# Patient Record
Sex: Female | Born: 1950 | Race: Black or African American | Hispanic: No | Marital: Single | Smoking: Never smoker
Health system: Southern US, Community
[De-identification: ages and names within clinical notes are randomized; demographics above are authoritative.]

## PROBLEM LIST (undated history)

## (undated) DIAGNOSIS — M1990 Unspecified osteoarthritis, unspecified site: Secondary | ICD-10-CM

## (undated) DIAGNOSIS — M549 Dorsalgia, unspecified: Secondary | ICD-10-CM

## (undated) DIAGNOSIS — I10 Essential (primary) hypertension: Secondary | ICD-10-CM

## (undated) DIAGNOSIS — R609 Edema, unspecified: Secondary | ICD-10-CM

## (undated) DIAGNOSIS — F329 Major depressive disorder, single episode, unspecified: Secondary | ICD-10-CM

## (undated) DIAGNOSIS — D649 Anemia, unspecified: Secondary | ICD-10-CM

## (undated) DIAGNOSIS — R569 Unspecified convulsions: Secondary | ICD-10-CM

## (undated) DIAGNOSIS — F32A Depression, unspecified: Secondary | ICD-10-CM

## (undated) DIAGNOSIS — G8929 Other chronic pain: Secondary | ICD-10-CM

## (undated) DIAGNOSIS — I509 Heart failure, unspecified: Secondary | ICD-10-CM

## (undated) DIAGNOSIS — E119 Type 2 diabetes mellitus without complications: Secondary | ICD-10-CM

## (undated) DIAGNOSIS — E785 Hyperlipidemia, unspecified: Secondary | ICD-10-CM

## (undated) DIAGNOSIS — N184 Chronic kidney disease, stage 4 (severe): Secondary | ICD-10-CM

## (undated) DIAGNOSIS — E039 Hypothyroidism, unspecified: Secondary | ICD-10-CM

## (undated) DIAGNOSIS — F419 Anxiety disorder, unspecified: Secondary | ICD-10-CM

## (undated) DIAGNOSIS — M109 Gout, unspecified: Secondary | ICD-10-CM

## (undated) HISTORY — PX: NO PAST SURGERIES: SHX2092

---

## 1998-04-15 DIAGNOSIS — E109 Type 1 diabetes mellitus without complications: Secondary | ICD-10-CM | POA: Insufficient documentation

## 1998-04-15 DIAGNOSIS — I10 Essential (primary) hypertension: Secondary | ICD-10-CM | POA: Insufficient documentation

## 1998-05-28 ENCOUNTER — Other Ambulatory Visit: Admission: RE | Admit: 1998-05-28 | Discharge: 1998-05-28 | Payer: Self-pay | Admitting: Family Medicine

## 1998-05-28 DIAGNOSIS — E039 Hypothyroidism, unspecified: Secondary | ICD-10-CM

## 2003-01-06 DIAGNOSIS — M109 Gout, unspecified: Secondary | ICD-10-CM

## 2004-07-09 ENCOUNTER — Ambulatory Visit: Payer: Self-pay | Admitting: Internal Medicine

## 2004-09-28 ENCOUNTER — Ambulatory Visit: Payer: Self-pay | Admitting: Family Medicine

## 2004-11-27 DIAGNOSIS — N183 Chronic kidney disease, stage 3 (moderate): Secondary | ICD-10-CM

## 2004-12-29 ENCOUNTER — Ambulatory Visit: Payer: Self-pay | Admitting: Family Medicine

## 2005-01-03 ENCOUNTER — Ambulatory Visit: Payer: Self-pay | Admitting: Family Medicine

## 2005-03-29 ENCOUNTER — Ambulatory Visit: Payer: Self-pay | Admitting: Family Medicine

## 2005-05-24 ENCOUNTER — Ambulatory Visit: Payer: Self-pay | Admitting: Family Medicine

## 2005-07-13 ENCOUNTER — Ambulatory Visit: Payer: Self-pay | Admitting: Family Medicine

## 2005-07-13 ENCOUNTER — Other Ambulatory Visit: Admission: RE | Admit: 2005-07-13 | Discharge: 2005-07-13 | Payer: Self-pay | Admitting: Family Medicine

## 2005-09-27 ENCOUNTER — Encounter: Admission: RE | Admit: 2005-09-27 | Discharge: 2005-09-27 | Payer: Self-pay | Admitting: Nephrology

## 2005-12-12 ENCOUNTER — Ambulatory Visit: Payer: Self-pay | Admitting: Family Medicine

## 2006-03-14 ENCOUNTER — Ambulatory Visit: Payer: Self-pay | Admitting: Family Medicine

## 2006-05-15 ENCOUNTER — Ambulatory Visit: Payer: Self-pay | Admitting: Family Medicine

## 2006-09-18 ENCOUNTER — Ambulatory Visit: Payer: Self-pay | Admitting: Family Medicine

## 2006-09-26 ENCOUNTER — Ambulatory Visit: Payer: Self-pay | Admitting: Family Medicine

## 2006-12-05 ENCOUNTER — Ambulatory Visit: Payer: Self-pay | Admitting: Family Medicine

## 2007-01-04 DIAGNOSIS — N2581 Secondary hyperparathyroidism of renal origin: Secondary | ICD-10-CM | POA: Insufficient documentation

## 2007-07-26 ENCOUNTER — Encounter (INDEPENDENT_AMBULATORY_CARE_PROVIDER_SITE_OTHER): Payer: Self-pay | Admitting: Family Medicine

## 2007-08-14 ENCOUNTER — Encounter (INDEPENDENT_AMBULATORY_CARE_PROVIDER_SITE_OTHER): Payer: Self-pay | Admitting: Family Medicine

## 2007-09-10 ENCOUNTER — Telehealth (INDEPENDENT_AMBULATORY_CARE_PROVIDER_SITE_OTHER): Payer: Self-pay | Admitting: Family Medicine

## 2007-10-02 ENCOUNTER — Ambulatory Visit: Payer: Self-pay | Admitting: Family Medicine

## 2007-10-02 DIAGNOSIS — E785 Hyperlipidemia, unspecified: Secondary | ICD-10-CM

## 2007-10-02 DIAGNOSIS — F70 Mild intellectual disabilities: Secondary | ICD-10-CM | POA: Insufficient documentation

## 2007-10-02 DIAGNOSIS — H9190 Unspecified hearing loss, unspecified ear: Secondary | ICD-10-CM | POA: Insufficient documentation

## 2007-10-02 LAB — CONVERTED CEMR LAB
Blood Glucose, Fingerstick: 156
Cholesterol: 130 mg/dL (ref 0–200)
Free T4: 1.22 ng/dL (ref 0.89–1.80)
HDL: 35 mg/dL — ABNORMAL LOW (ref >39)
Hgb A1c MFr Bld: 7.7 %
LDL Cholesterol: 77 mg/dL (ref 0–99)
TSH: 0.895 microintl units/mL (ref 0.350–5.50)
Total CHOL/HDL Ratio: 3.7
Triglycerides: 90 mg/dL (ref <150)
Uric Acid, Serum: 8.2 mg/dL — ABNORMAL HIGH (ref 2.4–7.0)
VLDL: 18 mg/dL (ref 0–40)

## 2007-10-11 ENCOUNTER — Encounter (INDEPENDENT_AMBULATORY_CARE_PROVIDER_SITE_OTHER): Payer: Self-pay | Admitting: Family Medicine

## 2008-07-10 ENCOUNTER — Telehealth (INDEPENDENT_AMBULATORY_CARE_PROVIDER_SITE_OTHER): Payer: Self-pay | Admitting: *Deleted

## 2008-07-18 ENCOUNTER — Telehealth (INDEPENDENT_AMBULATORY_CARE_PROVIDER_SITE_OTHER): Payer: Self-pay | Admitting: *Deleted

## 2008-07-21 ENCOUNTER — Ambulatory Visit: Payer: Self-pay | Admitting: Family Medicine

## 2008-07-21 LAB — CONVERTED CEMR LAB
Blood Glucose, Fingerstick: 188
Hgb A1c MFr Bld: 6.8 %

## 2008-07-28 ENCOUNTER — Telehealth (INDEPENDENT_AMBULATORY_CARE_PROVIDER_SITE_OTHER): Payer: Self-pay | Admitting: Family Medicine

## 2008-07-29 ENCOUNTER — Encounter (INDEPENDENT_AMBULATORY_CARE_PROVIDER_SITE_OTHER): Payer: Self-pay | Admitting: Family Medicine

## 2008-08-25 ENCOUNTER — Encounter (INDEPENDENT_AMBULATORY_CARE_PROVIDER_SITE_OTHER): Payer: Self-pay | Admitting: Family Medicine

## 2008-09-07 ENCOUNTER — Telehealth (INDEPENDENT_AMBULATORY_CARE_PROVIDER_SITE_OTHER): Payer: Self-pay | Admitting: Family Medicine

## 2008-09-07 LAB — CONVERTED CEMR LAB
ALT: 8 units/L (ref 0–35)
AST: 15 units/L (ref 0–37)
Albumin: 4.1 g/dL (ref 3.5–5.2)
Alkaline Phosphatase: 119 units/L — ABNORMAL HIGH (ref 39–117)
BUN: 21 mg/dL (ref 6–23)
Basophils Absolute: 0 10*3/uL (ref 0.0–0.1)
Basophils Relative: 0 % (ref 0–1)
CO2: 22 meq/L (ref 19–32)
Calcium: 8.9 mg/dL (ref 8.4–10.5)
Chloride: 97 meq/L (ref 96–112)
Cholesterol: 189 mg/dL (ref 0–200)
Creatinine, Ser: 1.68 mg/dL — ABNORMAL HIGH (ref 0.40–1.20)
Eosinophils Absolute: 0.1 10*3/uL (ref 0.0–0.7)
Eosinophils Relative: 1 % (ref 0–5)
Glucose, Bld: 116 mg/dL — ABNORMAL HIGH (ref 70–99)
HCT: 44.9 % (ref 36.0–46.0)
HDL: 49 mg/dL (ref >39)
Hemoglobin: 13.4 g/dL (ref 12.0–15.0)
LDL Cholesterol: 116 mg/dL — ABNORMAL HIGH (ref 0–99)
Lymphocytes Relative: 28 % (ref 12–46)
Lymphs Abs: 2.1 10*3/uL (ref 0.7–4.0)
MCHC: 29.8 g/dL — ABNORMAL LOW (ref 30.0–36.0)
MCV: 94.7 fL (ref 78.0–100.0)
Microalb, Ur: 48.8 mg/dL — ABNORMAL HIGH (ref 0.00–1.89)
Monocytes Absolute: 0.7 10*3/uL (ref 0.1–1.0)
Monocytes Relative: 9 % (ref 3–12)
Neutro Abs: 4.7 10*3/uL (ref 1.7–7.7)
Neutrophils Relative %: 62 % (ref 43–77)
Platelets: 284 10*3/uL (ref 150–400)
Potassium: 4.5 meq/L (ref 3.5–5.3)
Pro B Natriuretic peptide (BNP): 67 pg/mL (ref 0.0–100.0)
RBC: 4.74 M/uL (ref 3.87–5.11)
RDW: 17.9 % — ABNORMAL HIGH (ref 11.5–15.5)
Sodium: 140 meq/L (ref 135–145)
TSH: 33.803 microintl units/mL — ABNORMAL HIGH (ref 0.350–4.50)
Total Bilirubin: 0.4 mg/dL (ref 0.3–1.2)
Total CHOL/HDL Ratio: 3.9
Total Protein: 8.3 g/dL (ref 6.0–8.3)
Triglycerides: 118 mg/dL (ref <150)
VLDL: 24 mg/dL (ref 0–40)
WBC: 7.5 10*3/uL (ref 4.0–10.5)

## 2008-09-08 ENCOUNTER — Encounter (INDEPENDENT_AMBULATORY_CARE_PROVIDER_SITE_OTHER): Payer: Self-pay | Admitting: Family Medicine

## 2008-10-03 ENCOUNTER — Encounter (INDEPENDENT_AMBULATORY_CARE_PROVIDER_SITE_OTHER): Payer: Self-pay | Admitting: Family Medicine

## 2009-02-02 ENCOUNTER — Telehealth (INDEPENDENT_AMBULATORY_CARE_PROVIDER_SITE_OTHER): Payer: Self-pay | Admitting: Family Medicine

## 2009-08-19 ENCOUNTER — Encounter (INDEPENDENT_AMBULATORY_CARE_PROVIDER_SITE_OTHER): Payer: Self-pay | Admitting: Nurse Practitioner

## 2009-10-06 ENCOUNTER — Telehealth: Payer: Self-pay | Admitting: Physician Assistant

## 2009-10-15 ENCOUNTER — Encounter (INDEPENDENT_AMBULATORY_CARE_PROVIDER_SITE_OTHER): Payer: Self-pay | Admitting: *Deleted

## 2010-11-21 ENCOUNTER — Encounter: Payer: Self-pay | Admitting: Family Medicine

## 2010-11-21 ENCOUNTER — Encounter: Payer: Self-pay | Admitting: Occupational Therapy

## 2010-11-21 ENCOUNTER — Encounter: Payer: Self-pay | Admitting: Nephrology

## 2011-01-21 ENCOUNTER — Other Ambulatory Visit: Payer: Self-pay | Admitting: Geriatric Medicine

## 2011-01-21 DIAGNOSIS — Z1231 Encounter for screening mammogram for malignant neoplasm of breast: Secondary | ICD-10-CM

## 2011-01-26 ENCOUNTER — Ambulatory Visit: Payer: Self-pay

## 2011-05-05 ENCOUNTER — Emergency Department (HOSPITAL_COMMUNITY): Payer: Medicaid Other

## 2011-05-05 ENCOUNTER — Inpatient Hospital Stay (HOSPITAL_COMMUNITY)
Admission: EM | Admit: 2011-05-05 | Discharge: 2011-05-07 | DRG: 071 | Disposition: A | Discharge status: Home / Self Care | Payer: Medicaid Other | Attending: Internal Medicine | Admitting: Internal Medicine

## 2011-05-05 ENCOUNTER — Encounter (HOSPITAL_COMMUNITY): Payer: Self-pay | Admitting: Radiology

## 2011-05-05 DIAGNOSIS — R569 Unspecified convulsions: Secondary | ICD-10-CM

## 2011-05-05 DIAGNOSIS — Z79899 Other long term (current) drug therapy: Secondary | ICD-10-CM

## 2011-05-05 DIAGNOSIS — E119 Type 2 diabetes mellitus without complications: Secondary | ICD-10-CM | POA: Diagnosis present

## 2011-05-05 DIAGNOSIS — G9349 Other encephalopathy: Principal | ICD-10-CM | POA: Diagnosis present

## 2011-05-05 DIAGNOSIS — D696 Thrombocytopenia, unspecified: Secondary | ICD-10-CM | POA: Diagnosis present

## 2011-05-05 DIAGNOSIS — E039 Hypothyroidism, unspecified: Secondary | ICD-10-CM | POA: Diagnosis present

## 2011-05-05 DIAGNOSIS — Z993 Dependence on wheelchair: Secondary | ICD-10-CM

## 2011-05-05 DIAGNOSIS — E876 Hypokalemia: Secondary | ICD-10-CM | POA: Diagnosis present

## 2011-05-05 DIAGNOSIS — N390 Urinary tract infection, site not specified: Secondary | ICD-10-CM | POA: Diagnosis not present

## 2011-05-05 DIAGNOSIS — R197 Diarrhea, unspecified: Secondary | ICD-10-CM | POA: Diagnosis present

## 2011-05-05 DIAGNOSIS — M109 Gout, unspecified: Secondary | ICD-10-CM | POA: Diagnosis present

## 2011-05-05 DIAGNOSIS — D649 Anemia, unspecified: Secondary | ICD-10-CM | POA: Diagnosis present

## 2011-05-05 DIAGNOSIS — R609 Edema, unspecified: Secondary | ICD-10-CM | POA: Diagnosis present

## 2011-05-05 DIAGNOSIS — I10 Essential (primary) hypertension: Secondary | ICD-10-CM | POA: Diagnosis present

## 2011-05-05 HISTORY — DX: Unspecified convulsions: R56.9

## 2011-05-05 HISTORY — DX: Essential (primary) hypertension: I10

## 2011-05-05 LAB — TROPONIN I: Troponin I: <0.3 ng/mL (ref <0.30)

## 2011-05-05 LAB — CK TOTAL AND CKMB (NOT AT ARMC)
CK, MB: 1.6 ng/mL (ref 0.3–4.0)
Total CK: 130 U/L (ref 7–177)

## 2011-05-05 LAB — URINE MICROSCOPIC-ADD ON

## 2011-05-05 LAB — COMPREHENSIVE METABOLIC PANEL
ALT: 27 U/L (ref 0–35)
AST: 48 U/L — ABNORMAL HIGH (ref 0–37)
BUN: 7 mg/dL (ref 6–23)
Calcium: 7.7 mg/dL — ABNORMAL LOW (ref 8.4–10.5)
Creatinine, Ser: 1.42 mg/dL — ABNORMAL HIGH (ref 0.50–1.10)
GFR calc non Af Amer: 38 mL/min — ABNORMAL LOW (ref >60)
Glucose, Bld: 127 mg/dL — ABNORMAL HIGH (ref 70–99)
Potassium: 2.1 mEq/L — CL (ref 3.5–5.1)
Total Bilirubin: 0.4 mg/dL (ref 0.3–1.2)
Total Protein: 6.8 g/dL (ref 6.0–8.3)

## 2011-05-05 LAB — URINALYSIS, ROUTINE W REFLEX MICROSCOPIC
Glucose, UA: NEGATIVE mg/dL
Hgb urine dipstick: NEGATIVE
Ketones, ur: NEGATIVE mg/dL
Leukocytes, UA: NEGATIVE
Nitrite: NEGATIVE
Protein, ur: 100 mg/dL — AB
Specific Gravity, Urine: 1.013 (ref 1.005–1.030)
Urobilinogen, UA: 1 mg/dL (ref 0.0–1.0)
pH: 6 (ref 5.0–8.0)

## 2011-05-05 LAB — CBC
Hemoglobin: 11.3 g/dL — ABNORMAL LOW (ref 12.0–15.0)
MCH: 29 pg (ref 26.0–34.0)
MCHC: 32.7 g/dL (ref 30.0–36.0)
Platelets: 137 10*3/uL — ABNORMAL LOW (ref 150–400)
RBC: 3.9 MIL/uL (ref 3.87–5.11)
RDW: 15.6 % — ABNORMAL HIGH (ref 11.5–15.5)
WBC: 4.6 10*3/uL (ref 4.0–10.5)

## 2011-05-05 LAB — CARDIAC PANEL(CRET KIN+CKTOT+MB+TROPI)
Relative Index: 1.7 (ref 0.0–2.5)
Total CK: 102 U/L (ref 7–177)
Troponin I: <0.3 ng/mL (ref <0.30)

## 2011-05-05 LAB — PROTIME-INR
INR: 1.1 (ref 0.00–1.49)
Prothrombin Time: 14.4 seconds (ref 11.6–15.2)

## 2011-05-05 LAB — DIFFERENTIAL
Basophils Absolute: 0 10*3/uL (ref 0.0–0.1)
Basophils Relative: 0 % (ref 0–1)
Eosinophils Absolute: 0 10*3/uL (ref 0.0–0.7)
Lymphocytes Relative: 48 % — ABNORMAL HIGH (ref 12–46)
Lymphs Abs: 2.2 10*3/uL (ref 0.7–4.0)
Monocytes Relative: 13 % — ABNORMAL HIGH (ref 3–12)
Neutro Abs: 1.7 10*3/uL (ref 1.7–7.7)
Neutrophils Relative %: 38 % — ABNORMAL LOW (ref 43–77)

## 2011-05-05 LAB — GLUCOSE, CAPILLARY: Glucose-Capillary: 143 mg/dL — ABNORMAL HIGH (ref 70–99)

## 2011-05-05 LAB — POTASSIUM: Potassium: 2.4 mEq/L — CL (ref 3.5–5.1)

## 2011-05-05 LAB — D-DIMER, QUANTITATIVE: D-Dimer, Quant: 1.59 ug/mL-FEU — ABNORMAL HIGH (ref 0.00–0.48)

## 2011-05-05 LAB — MAGNESIUM: Magnesium: 1.3 mg/dL — ABNORMAL LOW (ref 1.5–2.5)

## 2011-05-05 LAB — APTT: aPTT: 32 seconds (ref 24–37)

## 2011-05-06 ENCOUNTER — Inpatient Hospital Stay (HOSPITAL_COMMUNITY): Payer: Medicaid Other

## 2011-05-06 DIAGNOSIS — I359 Nonrheumatic aortic valve disorder, unspecified: Secondary | ICD-10-CM

## 2011-05-06 DIAGNOSIS — M7989 Other specified soft tissue disorders: Secondary | ICD-10-CM

## 2011-05-06 LAB — IRON AND TIBC
Saturation Ratios: 11 % — ABNORMAL LOW (ref 20–55)
TIBC: 198 ug/dL — ABNORMAL LOW (ref 250–470)

## 2011-05-06 LAB — URINE CULTURE
Culture  Setup Time: 201207051417
Culture: NO GROWTH

## 2011-05-06 LAB — COMPREHENSIVE METABOLIC PANEL
AST: 40 U/L — ABNORMAL HIGH (ref 0–37)
Albumin: 2.3 g/dL — ABNORMAL LOW (ref 3.5–5.2)
BUN: 7 mg/dL (ref 6–23)
Calcium: 7.6 mg/dL — ABNORMAL LOW (ref 8.4–10.5)
Creatinine, Ser: 1.24 mg/dL — ABNORMAL HIGH (ref 0.50–1.10)
Total Bilirubin: 0.4 mg/dL (ref 0.3–1.2)
Total Protein: 6.3 g/dL (ref 6.0–8.3)

## 2011-05-06 LAB — CBC
MCV: 89.3 fL (ref 78.0–100.0)
Platelets: 139 10*3/uL — ABNORMAL LOW (ref 150–400)
RBC: 3.56 MIL/uL — ABNORMAL LOW (ref 3.87–5.11)
RDW: 15.7 % — ABNORMAL HIGH (ref 11.5–15.5)
WBC: 4.4 10*3/uL (ref 4.0–10.5)

## 2011-05-06 LAB — GLUCOSE, CAPILLARY
Glucose-Capillary: 84 mg/dL (ref 70–99)
Glucose-Capillary: 89 mg/dL (ref 70–99)

## 2011-05-06 LAB — HEMOGLOBIN A1C: Mean Plasma Glucose: 120 mg/dL — ABNORMAL HIGH (ref <117)

## 2011-05-06 LAB — CARDIAC PANEL(CRET KIN+CKTOT+MB+TROPI)
CK, MB: 2.1 ng/mL (ref 0.3–4.0)
Relative Index: 1.1 (ref 0.0–2.5)
Total CK: 184 U/L — ABNORMAL HIGH (ref 7–177)
Troponin I: <0.3 ng/mL (ref <0.30)

## 2011-05-06 LAB — URINALYSIS, ROUTINE W REFLEX MICROSCOPIC
Protein, ur: 100 mg/dL — AB
Specific Gravity, Urine: 1.017 (ref 1.005–1.030)
Urobilinogen, UA: 2 mg/dL — ABNORMAL HIGH (ref 0.0–1.0)

## 2011-05-06 LAB — MAGNESIUM: Magnesium: 2.2 mg/dL (ref 1.5–2.5)

## 2011-05-06 LAB — VITAMIN B12: Vitamin B-12: 668 pg/mL (ref 211–911)

## 2011-05-06 LAB — URIC ACID: Uric Acid, Serum: 9.8 mg/dL — ABNORMAL HIGH (ref 2.4–7.0)

## 2011-05-06 LAB — URINE MICROSCOPIC-ADD ON

## 2011-05-06 LAB — FERRITIN: Ferritin: 643 ng/mL — ABNORMAL HIGH (ref 10–291)

## 2011-05-06 NOTE — Procedures (Signed)
EEG NUMBER:  HISTORY:  A 60 year old female with new-onset seizure.  MEDICATIONS:  Norvasc, Catapres, Pepcid, NovoLog, levothyroxine, potassium chloride, and Zocor.  CONDITIONS OF RECORDING:  This is 16-channel EEG carried out with the patient in the awake state.  DESCRIPTION:  The waking background activity consists of a low-voltage symmetrical, fairly well-organized, 9 Hz alpha activity seen from the parietooccipital and posttemporal regions.  Low-voltage fast activity, poorly organized was anteriorly, at times superimposed on more posterior rhythms.  A mixture of theta and alpha was seen from these central and temporal regions.  The patient does not drowse or sleep. Hypoventilation was not performed.  Intermittent photic stimulation failed to elicit any change in the tracing.  IMPRESSION:  This is a normal awake EEG.  COMMENT:  An EEG with the patient is sleep deprive to elicit drowse and light sleep may be desirable to further elicit possible seizure disorder.          ______________________________ Alexis Goodell, MD    [id number] D:  05/06/2011 18:15:37  T:  05/06/2011 18:41:34  Job #:  [id number]

## 2011-05-07 LAB — GLUCOSE, CAPILLARY: Glucose-Capillary: 172 mg/dL — ABNORMAL HIGH (ref 70–99)

## 2011-05-09 LAB — URINE CULTURE

## 2011-05-13 LAB — CULTURE, BLOOD (ROUTINE X 2)
Culture  Setup Time: 201207070413
Culture: NO GROWTH

## 2011-05-15 NOTE — Discharge Summary (Signed)
NAME:  Kayla Hunt, Kayla Hunt NO.:  [id number]  MEDICAL RECORD NO.:  [id number]  LOCATION:                                 FACILITY:  PHYSICIAN:  Oren Binet, MD    DATE OF BIRTH:  28-Jun-1951  DATE OF ADMISSION: DATE OF DISCHARGE:                        DISCHARGE SUMMARY - REFERRING   PRIMARY CARE PRACTITIONER:  Reymundo Poll, MD  PRIMARY DISCHARGE DIAGNOSES: 1. 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 2. Seizures x1 episode secondary to above. 3. One episode of fever, probably secondary to a urinary tract     infection.  Cultures are pending.  The patient is insisting on     going home. 4. Hypokalemia, secondary to diarrhea. 5. Diarrhea, resolved spontaneously.  SECONDARY DISCHARGE DIAGNOSES/PAST MEDICAL HISTORY: 1. History of hypertension.  Per history obtained by me from the     patient, the patient ran out of her medications a week ago. 2. Hypothyroidism. 3. Gout. 4. Chronic lower extremity edema. 5. Diabetes, type 2. 6. Very minimally ambulatory, mostly bed to wheelchair bound. 7. Morbid obesity.  DISCHARGE MEDICATIONS: 1. Ceftin 500 mg 1 tablet p.o. twice daily. 2. Clonidine 0.1 mg p.o. twice daily. 3. Amlodipine 10 mg 1 tablet daily. 4. BC powders one powder twice daily p.r.n. 5. Pepcid 20 mg 1 tablet daily at bedtime. 6. Metformin 500 mg 1 tablet twice daily. 7. Simvastatin 20 mg 1 tablet daily at bedtime. 8. Levothyroxine 25 mcg 1 tablet daily. 9. Vitamin D2 - 2000 International Units 1 tablet p.o. every morning.  CONSULTANTS ON THIS CASE:  None.  BRIEF HISTORY OF PRESENT ILLNESS:  The patient is a 60 year old female with the above-noted past medical history, was brought into the hospital for first episode of tonic-clonic seizure.  She was then admitted to the hospitalist service for further evaluation and management.  For further details, please see the history and physical that was dictated  by Dr. Hal Hope.  RADIOLOGICAL STUDIES: 1. MRI of the brain showed abnormal pattern of brain edema affecting     the cerebellum and the posterior aspect of the cerebral     hemispheres.  The findings are most consistent with posterior     reversible encephalopathy.  No sign of hemorrhage or significant     swelling or mass effect.  Diffusion imaging does not show     infarction. 2. Abdominal ultrasound showed fatty infiltration of liver. 3. Portable chest x-ray showed no evidence of acute pulmonary disease. 4. CT of the head showed no evidence of brain mass, brain hemorrhage,     or acute infarction.  BRIEF HOSPITAL COURSE: 1. First episode of generalized tonic-clonic seizure.  This is     presumed secondary to posterior reversible encephalopathy syndrome.     Upon further questioning, the patient ran out of her blood pressure     meds more than a week ago.  It is at this time presumed that the     posterior reversible encephalopathy syndrome was precipitated by     elevated blood pressure at home.  In any event, upon admission to     the hospital, the patient was noted to  have moderately well     controlled hypertension.  Her blood pressure medications were     resumed.  She was awake and alert and did not have any further     seizures on admission and was actually wanting to go home on this     yesterday.  Her EEG was done which did not show any epileptiform     activity.  I did speak with Dr. Alexis Goodell of Neurology over     the phone and per her, all of these symptoms are consistent with     her not to being compliant with her blood pressure medications and     as a result having elevated blood pressure at home and causing the     posterior reversible encephalopathy syndrome resulting in seizures.     She is also advised that if the EEG is normal, no further workup is     necessary.  The patient is also not a candidate for antiepileptic     therapy as well.  The patient  has been counseled extensively by me,     the family has also been told that call the primary care doctor at     least a week before all of her medications run out so that this     will not happen in the future.  They claim understanding. 2. Hypokalemia.  This was secondary to diarrhea and once the diarrhea     resolved, this also resolved.  The patient was given numerous     dosing of potassium on admission. 3. Diarrhea.  This was noted on admission.  Apparently, a few days     prior to admission, the patient started having two to three loose     bowel movements every day.  However, upon admission, this resolved     and we could not even do stool workup as the patient did not even     have a bowel movement. 4. Hypertension.  The patient as noted above has been asked to be more     compliant with medications and to call her primary care at least a     few days before she runs out of her medications.  She is to     continue amlodipine and to continue clonidine as well.  Per     daughter, the patient has filled all of her medications and does     not need prescriptions for her oral medications. 5. Diabetes.  The patient is on metformin initially and this was     placed on hold as her creatinine was slightly elevated, however, it     has come down to 1.24 and this is being resumed on discharge. 6. Febrile illness.  On admission, since the patient was postictal, a     Foley catheter was placed.  Her initial urinalysis was negative.     However, last evening, the patient had an episode of a fever with a     temperature of 102 degrees Fahrenheit.  A repeat urinalysis done     during that time shows a UA consistent with a UTI.  Her chest x-ray     done again yesterday shows no pneumonia.  Blood and urine cultures     are currently pending.  However, the patient is insisting on being     discharged today and will leave even if she is not discharged     against medical advice.  I did speak  with  the patient's daughter     who stays with her and provides her almost 24-hour care, and she is     willing to take her mother home as her mother is not going to stay     any further, and I have told her to bring her back to the hospital     in case she has worsening of persistent fever or becomes lethargic     or has unstable vital signs.  The patient will be given a 5-day     supply of Ceftin as she was put on Rocephin yesterday and we will     have her primary care follow up her blood culture and urine culture     results.  The patient and the patient's daughter have been     repeatedly told by me to seek immediate medical attention if she     were to deteriorate and they claim understanding.  DISPOSITION:  The patient is being discharged home at her request to the care of the patient's family.  FOLLOWUP INSTRUCTIONS:  The patient is to follow up with Dr. Fredderick Phenix within 1 week.  Her urine and blood cultures are currently pending and they will need followup as well.  Total time spent for discharge 55 minutes.     Oren Binet, MD     SG/MEDQ  D:  05/07/2011  T:  05/07/2011  Job:  [id number]  cc:   Reymundo Poll, MD  Electronically Signed by Oren Binet  on 05/15/2011 11:19:10 AM

## 2011-05-17 NOTE — H&P (Signed)
NAMEMYA, REUM NO.:  [id number]  MEDICAL RECORD NO.:  [id number]  LOCATION:  [id number]                         FACILITY:  [facility]  PHYSICIAN:  Rise Patience, MDDATE OF BIRTH:  08-Jul-1951  DATE OF ADMISSION:  05/05/2011 DATE OF DISCHARGE:                             HISTORY & PHYSICAL   PRIMARY CARE PHYSICIAN:  Reymundo Poll, MD  CHIEF COMPLAINT:  Seizure.  HISTORY OF PRESENTING ILLNESS:  A 60 year old female with history of diabetes mellitus type 2, hypertension, gout, hypothyroidism, chronic lower extremity edema, has been witnessed to have a seizure as per the patient's niece.  The patient was at home at around 11 o'clock.  The patient had a generalized tonic-clonic seizure and felt unconscious. Both these episode lasted 2-3 minutes as witnessed by the patient's niece.  The patient then was unconscious until the EMS came and per note, the patient also had another seizure for which midazolam was given.  After going to the ER, the patient did not have any further seizures.  The patient was found to be hypokalemic.  CT of head is negative.  The patient has been admitted for further observation.  The patient's niece states that the patient has been having diarrhea for over the last 1 week.  Denies any nausea, vomiting or abdominal pain. Denies any chest pain or shortness of breath.  Denies any fever or chills, cough, or phlegm.  The patient did not have any focal deficit.  After the seizure episode, has not had any problems speaking or swallowing.  Denies any visual symptoms.  PAST MEDICAL HISTORY: 1. Hypertension. 2. Hypothyroidism. 3. Gout. 4. Chronic lower extremity edema. 5. Diabetes mellitus type 2.  MEDICATIONS PRIOR TO ADMISSION:  As provided by the patient's niece are: 1. Simvastatin 20 mg p.o. at bedtime. 2. Multivitamins. 3. Synthroid 25 mcg p.o. daily. 4. Clonidine 0.1 mg p.o. twice daily. 5. Colcrys 0.6 mg p.r.n. 6. Amlodipine 10  mg daily. 7. Famotidine 20 mg daily. 8. Metformin 500 mg one tablet twice daily.  ALLERGIES:  No known drug allergies.  SOCIAL HISTORY:  The patient does not smoke cigarette, but snuffs tobacco.  Denies any alcohol or drug abuse.  Lives with her niece.  She is a full code.  FAMILY HISTORY:  Nothing contributory.  The patient's dad had a stroke.  REVIEW OF SYSTEMS:  As per the history of presenting illness, nothing else significant.  PHYSICAL EXAMINATION:  GENERAL:  The patient examined at the bedside, not in acute distress. VITAL SIGNS:  Blood pressure is 161/90, pulse is 97 per minute, when the patient came, it was 126, respirations 16 per minute, temperature 98.5, O2 sat 100%. HEENT:  Anicteric.  No pallor.  No discharge from ears, eyes, nose or mouth.  No facial asymmetry.  Tongue is midline.  PERRLA positive. CHEST:  Bilateral air entry present.  No rhonchi.  No crepitation. HEART:  S1 and S2 heard. ABDOMEN:  Soft and nontender.  Bowel sounds heard. CENTRAL NERVOUS SYSTEM:  The patient is alert, awake, and oriented to time, place, and person.  Moves upper and lower extremities 5/5. EXTREMITIES:  Peripheral pulses felt.  No edema.  LABORATORY STUDIES:  EKG shows sinus rhythm with sinus tachycardia, beats are around 124 beats per minute with nonspecific ST-T changes. Chest x-ray shows there is no evidence of acute cardiac or pulmonary process.  CT of the head without contrast shows there is no evidence of brain mass, brain hemorrhage, or acute infarction.  No acute or active brain process is seen.  No auscultation is evident.  There is slight prominence of the cortical sulci and fissures consistent with minimal cortical atrophic chain.  There is minimal right basal ganglia calcification.  The calvarium is intact.  No skull fracture or depression of bone.  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  Left mastoid air cells  are labeled up and are well aerated.  Over the portion of the right maxillary sinus demonstrated there is absence of the medial wall consistent with previous surgery.  There is mucosal thickening of the right maxillary sinus.  No air fluid levels were seen in the paranasal sinuses.  There is a small sclerotic density in the left ethmoid air cells consistent with small sinuses tumor.  CBC; WBC is 4.6, hemoglobin is 11.3, hematocrit is 34.6, platelets 137.  PT/INR is 14.4 and 1.1. Sodium is 143, potassium is 2.1, repeat is 2.4, chloride 99, carbon dioxide 24, glucose 127, BUN 7, creatinine 1.4.  Total bilirubin is 0.4, alkaline phosphatase 119, AST 48, ALT 27, total protein 6.8, albumin 2.6, calcium 7.7, magnesium 1.3.  CK 130, CK-MB 1.6, relative index 1.2, troponin-I less than 0.3.  UA is negative for nitrites and leukocytes. Cultures are pending.  ASSESSMENT: 1. Seizures. 2. Diarrhea. 3. Hypokalemia and hypomagnesemia. 4. Diabetes mellitus type 2. 5. History of hypertension. 6. History of hyperlipidemia. 7. History of hypothyroidism.  PLAN: 1. At this time, we will admit the patient to telemetry. 2. For her seizures, at this time, we will keep the patient on p.r.n.     IV Ativan and get MRI of the brain and EEG, seizure precaution and     I will discuss with neurologist. 3. Hypokalemia.  At this time, we are going to replace potassium IV     and p.o.  Her hypokalemia could be from the diarrhea, which the     patient is having over the last 1 week, but the patient's niece     states that both her parents had low potassium and had a problem     with potassium.  We should consider other causes for her     hyperkalemia at this time, but at this time, I am going to replace     potassium and magnesium. 4. For her diarrhea, I am going to get a stool studies. 5. The patient does have anemia and thrombocytopenia.  I do not have     any baseline studies or labs to compare.  I am going to  check     anemia panel and keep a close watch on her platelet counts. 6. The patient does have chronic lower extremity edema, which I am     going to get a Doppler of the lower extremity. 7. The patient is tachycardic.  At this time, mildly we are going to     check a TSH and also D-dimer and further recommendation based on     the test order and clinical course.     Rise Patience, MD     ANK/MEDQ  D:  05/05/2011  T:  05/05/2011  Job:  [id number]  cc:   Reymundo Poll, MD  Electronically Signed by Gean Birchwood MD on 05/17/2011 07:31:40 AM

## 2015-03-06 ENCOUNTER — Emergency Department (HOSPITAL_COMMUNITY)
Admission: EM | Admit: 2015-03-06 | Discharge: 2015-03-09 | Disposition: A | Discharge status: Home / Self Care | Payer: Medicaid Other | Attending: Emergency Medicine | Admitting: Emergency Medicine

## 2015-03-06 ENCOUNTER — Encounter (HOSPITAL_COMMUNITY): Payer: Self-pay | Admitting: Emergency Medicine

## 2015-03-06 DIAGNOSIS — I10 Essential (primary) hypertension: Secondary | ICD-10-CM | POA: Insufficient documentation

## 2015-03-06 DIAGNOSIS — M791 Myalgia: Secondary | ICD-10-CM | POA: Diagnosis not present

## 2015-03-06 DIAGNOSIS — E119 Type 2 diabetes mellitus without complications: Secondary | ICD-10-CM | POA: Diagnosis not present

## 2015-03-06 DIAGNOSIS — Z5900 Homelessness unspecified: Secondary | ICD-10-CM

## 2015-03-06 DIAGNOSIS — Z79899 Other long term (current) drug therapy: Secondary | ICD-10-CM | POA: Diagnosis not present

## 2015-03-06 HISTORY — DX: Hypothyroidism, unspecified: E03.9

## 2015-03-06 HISTORY — DX: Hyperlipidemia, unspecified: E78.5

## 2015-03-06 HISTORY — DX: Gout, unspecified: M10.9

## 2015-03-06 HISTORY — DX: Edema, unspecified: R60.9

## 2015-03-06 LAB — CBG MONITORING, ED
Glucose-Capillary: 85 mg/dL (ref 70–99)
Glucose-Capillary: 111 mg/dL — ABNORMAL HIGH (ref 70–99)

## 2015-03-06 MED ORDER — SIMVASTATIN 20 MG PO TABS
20.0000 mg | ORAL_TABLET | Freq: Every day | ORAL | Status: DC
  Administered 2015-03-07 – 2015-03-09 (×3): 20 mg via ORAL
  Filled 2015-03-06 (×3): qty 1

## 2015-03-06 MED ORDER — PROMETHAZINE HCL 25 MG PO TABS: 12.5000 mg | ORAL_TABLET | Freq: Four times a day (QID) | ORAL | Status: DC | PRN

## 2015-03-06 MED ORDER — HYDRALAZINE HCL 25 MG PO TABS
25.0000 mg | ORAL_TABLET | Freq: Three times a day (TID) | ORAL | Status: DC
  Administered 2015-03-06 – 2015-03-09 (×7): 25 mg via ORAL
  Filled 2015-03-06 (×12): qty 1

## 2015-03-06 MED ORDER — LEVOTHYROXINE SODIUM 112 MCG PO TABS
112.0000 ug | ORAL_TABLET | Freq: Every day | ORAL | Status: DC
  Administered 2015-03-07 – 2015-03-09 (×3): 112 ug via ORAL
  Filled 2015-03-06 (×4): qty 1

## 2015-03-06 MED ORDER — METOPROLOL SUCCINATE ER 25 MG PO TB24
25.0000 mg | ORAL_TABLET | Freq: Every day | ORAL | Status: DC
  Administered 2015-03-07 – 2015-03-09 (×3): 25 mg via ORAL
  Filled 2015-03-06 (×3): qty 1

## 2015-03-06 MED ORDER — GLIMEPIRIDE 2 MG PO TABS
2.0000 mg | ORAL_TABLET | Freq: Every day | ORAL | Status: DC
  Administered 2015-03-07 – 2015-03-09 (×3): 2 mg via ORAL
  Filled 2015-03-06 (×4): qty 1

## 2015-03-06 MED ORDER — LOSARTAN POTASSIUM 50 MG PO TABS
100.0000 mg | ORAL_TABLET | Freq: Every day | ORAL | Status: DC
  Administered 2015-03-07 – 2015-03-09 (×3): 100 mg via ORAL
  Filled 2015-03-06 (×3): qty 2

## 2015-03-06 MED ORDER — DICLOFENAC SODIUM 1 % TD GEL
2.0000 g | Freq: Four times a day (QID) | TRANSDERMAL | Status: DC | PRN
  Filled 2015-03-06: qty 100

## 2015-03-06 MED ORDER — LOSARTAN POTASSIUM-HCTZ 100-12.5 MG PO TABS: 1.0000 | ORAL_TABLET | Freq: Every day | ORAL | Status: DC

## 2015-03-06 MED ORDER — HYDROCHLOROTHIAZIDE 12.5 MG PO CAPS
12.5000 mg | ORAL_CAPSULE | Freq: Every day | ORAL | Status: DC
  Administered 2015-03-07 – 2015-03-09 (×3): 12.5 mg via ORAL
  Filled 2015-03-06 (×4): qty 1

## 2015-03-06 MED ORDER — FUROSEMIDE 20 MG PO TABS
20.0000 mg | ORAL_TABLET | Freq: Every day | ORAL | Status: DC
  Administered 2015-03-06 – 2015-03-09 (×4): 20 mg via ORAL
  Filled 2015-03-06 (×2): qty 1
  Filled 2015-03-06: qty 0.5
  Filled 2015-03-06: qty 1

## 2015-03-06 NOTE — ED Notes (Signed)
New number for Pt's niece is Catha Nottingham:  [phone]

## 2015-03-06 NOTE — Progress Notes (Signed)
CSW spoke with pt's niece, Kayla Hunt, re: pt/her brother d/c'ing home to the hotel this pm.  Per Kayla Hunt, this was never the plan and she was told by APS to send pt/brother to the ED and "the ED social workers" would place them from the ED. Explained to [facility] that typically, pt's require medical admission in order to be placed in a facility and that APS could assist pt with placement from the hotel.  Kayla Hunt states that there is not much she can do for them at this point as she is living in a motel and cannot let them stay with her.  Kayla Hunt states that pt's sister, Kayla Hunt should be called for all further inquires.  APS report made to [facility], who stated that APS may not be able to intervene until Monday.  W/E CSW will f/u.

## 2015-03-06 NOTE — Progress Notes (Signed)
CSW received consult for placement. Pt has been living at home with pt daughter. Pt is wheelchair bound. Patient shared that house has leaking rough, plumbing issues, and evicted. Per pt the landlord is going to sell the house and they had no placement. Pt daugher called DSS and were directed to come to the hosptial. Pt asked csw to call daughter, however unable to reach pt daughter by phone. Number listed is disconnected. rn cm assisting with dispo and [place] csw, [place], [facility] [phone]

## 2015-03-06 NOTE — ED Notes (Signed)
Social Worker @ Bedside

## 2015-03-06 NOTE — ED Notes (Addendum)
Per EMS pt's rented house was sold by owners of house, new owners state all of the residents need to leave. Patient's daughter called Social Services, Social Services told daughter to call 911 and have patient transported to ED. Patient mobile in electric wheelchair, but unable to ambulate. Pt is poor historian, patient shows signs delayed mental faculties, patient's daughter states patient's current mental status is her norm. Pt alert and oriented x 4.

## 2015-03-06 NOTE — ED Provider Notes (Signed)
Results for orders placed or performed during the hospital encounter of 03/06/15  CBG monitoring, ED  Result Value Ref Range   Glucose-Capillary 85 70 - 99 mg/dL  CBG monitoring, ED  Result Value Ref Range   Glucose-Capillary 111 (H) 70 - 99 mg/dL   Comment 1 Notify RN    Comment 2 Call MD NNP PA CNM    Patient continues to hold for placement. Social work and case management have been involved. Please see their notes for complete evaluation.  Pattricia Boss, MD 03/06/15 [phone]

## 2015-03-06 NOTE — ED Notes (Signed)
Bed: [id number] Expected date:  Expected time:  Means of arrival:  Comments: EMS-APS-placement

## 2015-03-06 NOTE — ED Provider Notes (Signed)
CSN: [id number]     Arrival date & time 03/06/15  1244 History   First MD Initiated Contact with Patient 03/06/15 1255     Chief Complaint  Patient presents with  . Placement Issue     HPI Patient presents due to homelessness. The patient has developmental delay, is nonambulatory, requiring a wheelchair. Patient has been living with her daughter.  Today the patient's daughter was evicted, the patient was affected as well. In addition, there is one other handicapped individual now homeless. Patient herself denies new complaints, including fever, chills, chest pain, dyspnea.    Past Medical History  Diagnosis Date  . Diabetes mellitus   . Hypertension    History reviewed. No pertinent past surgical history. History reviewed. No pertinent family history. History  Substance Use Topics  . Smoking status: Not on file  . Smokeless tobacco: Not on file  . Alcohol Use: Not on file   OB History    No data available     Review of Systems  Constitutional: Negative for fever.  HENT: Negative for congestion.   Respiratory: Negative for chest tightness.   Cardiovascular: Negative for chest pain.  Gastrointestinal: Negative for abdominal pain.  Musculoskeletal: Positive for myalgias.  Allergic/Immunologic: Negative for immunocompromised state.  Neurological: Negative for numbness.      Allergies  Review of patient's allergies indicates no known allergies.  Home Medications   Prior to Admission medications   Medication Sig Start Date End Date Taking? Authorizing Provider  diclofenac sodium (VOLTAREN) 1 % GEL Apply 2 g topically 4 (four) times daily as needed (knee pain).   Yes Historical Provider, MD  furosemide (LASIX) 20 MG tablet Take 20 mg by mouth daily.   Yes Historical Provider, MD  glimepiride (AMARYL) 2 MG tablet Take 2 mg by mouth daily with breakfast.   Yes Historical Provider, MD  hydrALAZINE (APRESOLINE) 25 MG tablet Take 25 mg by mouth 3 (three) times daily.   Yes  Historical Provider, MD  levothyroxine (SYNTHROID, LEVOTHROID) 112 MCG tablet Take 112 mcg by mouth daily before breakfast.   Yes Historical Provider, MD  losartan-hydrochlorothiazide (HYZAAR) 100-12.5 MG per tablet Take 1 tablet by mouth daily.   Yes Historical Provider, MD  metoprolol succinate (TOPROL-XL) 25 MG 24 hr tablet Take 25 mg by mouth daily.  02/24/15  Yes Historical Provider, MD  promethazine (PHENERGAN) 12.5 MG tablet Take 12.5 mg by mouth every 6 (six) hours as needed for nausea or vomiting.   Yes Historical Provider, MD  simvastatin (ZOCOR) 20 MG tablet Take 20 mg by mouth daily.   Yes Historical Provider, MD   BP 154/68 mmHg  Pulse 76  Temp(Src) 98.6 F (37 C) (Oral)  Resp 16  SpO2 98% Physical Exam  Constitutional: She is oriented to person, place, and time. She appears well-developed and well-nourished. No distress.  HENT:  Head: Normocephalic and atraumatic.  Eyes: Conjunctivae and EOM are normal.  Pulmonary/Chest: Effort normal. No stridor. No respiratory distress.  Abdominal: She exhibits no distension.  Musculoskeletal: She exhibits no edema.  Neurological: She is alert and oriented to person, place, and time. No cranial nerve deficit. She exhibits abnormal muscle tone.  Skin: Skin is warm and dry.  Psychiatric: She has a normal mood and affect. Her behavior is normal.  Nursing note and vitals reviewed.   ED Course  Procedures (including critical care time)    MDM   Patient presents with concern of homelessness. After the initial evaluation I discussed the patient's  case with our care management team, to facilitate placement. Patient has no medical complaints, is hemodynamically stable, in no distress, has a reassuring exam.    Carmin Muskrat, MD 03/06/15 1515

## 2015-03-06 NOTE — Care Management Note (Addendum)
Case Management Note  Patient Details  Name: Kayla Hunt MRN: [id number] Date of Birth: 1951-09-17  Subjective/Objective:                  64 yr old medicaid [place] access [place] pt evicted from house--[phone] listed as home number and for Kenney Houseman is a disconnected number. Pt informed CM she is seen by [facility] but this is incorrect (CM called agency to see if pt was active client and found pt has never been seen)  Pt is seen by [facility] per Nira Conn at pcp office for personal care services   Action/Plan:  1709 ED CM attempted to call Kenney Houseman but listed number [phone] is disconnected ED CM called [facility] who states pt and her brother have been patients with them for "a long period of time through CAPs services. Lawanda of [facility] states she is available for 24 hour services if needed Tommie Sams reports their company has a Case manger who is willing to work with pt.  Tommie Sams reports pt and brother lived with [place].  Kenney Houseman is to be evicted on 03/15/15 and the lights/electricity in home to be turned off today 03/06/15. Tommie Sams states they had recently spoke with [place] before CM called who states "their money ran out for the month" but today is 03/06/15. Lawanda states Tonya called APS.  Pt states Kenney Houseman is really her "niece but I raised her like my daughter"   Cm explained to [place] that [facility] ED does not provide housing nor facility services especially if there is not any skilled medical need CM left CM's mobile contact number for Lawanda of [facility] to give to [facility] for a return call   Expected Discharge Date:   03/06/15               Expected Discharge Plan:   d/c to hotel with Kenney Houseman  In-House Referral:  ED SW Discharge planning Services   personal care services,   Post Acute Care Choice:   na Choice offered to:   na  DME Arranged:   na DME Agency:   na  HH Arranged:    personal care services via [facility]s [place]:   [facility]  Status of Service:   pending   Medicare Important Message Given:   na Date Medicare IM Given:   na Medicare IM give by:   na Date Additional Medicare IM Given:   na Additional Medicare Important Message give by:   na  If discussed at Long Length of Stay Meetings, dates discussed:  no  Additional Comments:  Robbie Lis, RN 03/06/2015, 5:04 PM

## 2015-03-07 LAB — URINALYSIS, ROUTINE W REFLEX MICROSCOPIC
BILIRUBIN URINE: NEGATIVE
Glucose, UA: NEGATIVE mg/dL
HGB URINE DIPSTICK: NEGATIVE
Ketones, ur: NEGATIVE mg/dL
Leukocytes, UA: NEGATIVE
Nitrite: NEGATIVE
Protein, ur: 30 mg/dL — AB
Specific Gravity, Urine: 1.012 (ref 1.005–1.030)
Urobilinogen, UA: 0.2 mg/dL (ref 0.0–1.0)
pH: 5.5 (ref 5.0–8.0)

## 2015-03-07 LAB — COMPREHENSIVE METABOLIC PANEL
ALT: 14 U/L (ref 14–54)
AST: 24 U/L (ref 15–41)
Albumin: 3.3 g/dL — ABNORMAL LOW (ref 3.5–5.0)
Alkaline Phosphatase: 94 U/L (ref 38–126)
Anion gap: 8 (ref 5–15)
BUN: 40 mg/dL — AB (ref 6–20)
CALCIUM: 8.9 mg/dL (ref 8.9–10.3)
CO2: 23 mmol/L (ref 22–32)
Chloride: 108 mmol/L (ref 101–111)
Creatinine, Ser: 2.04 mg/dL — ABNORMAL HIGH (ref 0.44–1.00)
GFR calc Af Amer: 29 mL/min — ABNORMAL LOW (ref >60)
GFR, EST NON AFRICAN AMERICAN: 25 mL/min — AB (ref >60)
GLUCOSE: 166 mg/dL — AB (ref 70–99)
Potassium: 4.4 mmol/L (ref 3.5–5.1)
SODIUM: 139 mmol/L (ref 135–145)
Total Bilirubin: 0.5 mg/dL (ref 0.3–1.2)
Total Protein: 7.8 g/dL (ref 6.5–8.1)

## 2015-03-07 LAB — CBC WITH DIFFERENTIAL/PLATELET
Basophils Absolute: 0 10*3/uL (ref 0.0–0.1)
Basophils Relative: 0 % (ref 0–1)
Eosinophils Absolute: 0.1 10*3/uL (ref 0.0–0.7)
Eosinophils Relative: 1 % (ref 0–5)
HCT: 38.3 % (ref 36.0–46.0)
HEMOGLOBIN: 11.5 g/dL — AB (ref 12.0–15.0)
LYMPHS ABS: 2.6 10*3/uL (ref 0.7–4.0)
LYMPHS PCT: 37 % (ref 12–46)
MCH: 27.8 pg (ref 26.0–34.0)
MCHC: 30 g/dL (ref 30.0–36.0)
MCV: 92.5 fL (ref 78.0–100.0)
MONOS PCT: 6 % (ref 3–12)
Monocytes Absolute: 0.5 10*3/uL (ref 0.1–1.0)
NEUTROS PCT: 56 % (ref 43–77)
Neutro Abs: 4 10*3/uL (ref 1.7–7.7)
Platelets: 251 10*3/uL (ref 150–400)
RBC: 4.14 MIL/uL (ref 3.87–5.11)
RDW: 16.2 % — ABNORMAL HIGH (ref 11.5–15.5)
WBC: 7.1 10*3/uL (ref 4.0–10.5)

## 2015-03-07 LAB — URINE MICROSCOPIC-ADD ON

## 2015-03-07 LAB — CBG MONITORING, ED
GLUCOSE-CAPILLARY: 86 mg/dL (ref 70–99)
Glucose-Capillary: 70 mg/dL (ref 70–99)
Glucose-Capillary: 76 mg/dL (ref 70–99)

## 2015-03-07 MED ORDER — TUBERCULIN PPD 5 UNIT/0.1ML ID SOLN
5.0000 [IU] | Freq: Once | INTRADERMAL | Status: AC
  Administered 2015-03-07: 5 [IU] via INTRADERMAL
  Filled 2015-03-07: qty 0.1

## 2015-03-07 NOTE — ED Notes (Signed)
Patient alert, respirations even and unlabored, skin warm and dry, in NAD, tech at bedside to assist with toileting, denies needs. Will continue to monitor.

## 2015-03-07 NOTE — Clinical Social Work Note (Signed)
Clinical Social Work Assessment  Patient Details  Name: Kayla Hunt MRN: [id number] Date of Birth: October 25, 1951  Date of referral:  03/06/15               Reason for consult:  Facility Placement, Housing Concerns/Homelessness, Discharge Planning                Permission sought to share information with:  Family Supports, Customer service manager Permission granted to share information::  Yes, Verbal Permission Granted  Name::     Best boy::  family  Relationship::  neice  Contact Information:  [phone]  Housing/Transportation Living arrangements for the past 2 months:  Single Family Home Source of Information:  Patient, Other (Comment Required) (neice Engineer, structural) Patient Interpreter Needed:  None Criminal Activity/Legal Involvement Pertinent to Current Situation/Hospitalization:  No - Comment as needed Significant Relationships:  Siblings, Other Family Members Lives with:  Siblings, Other (Comment) (neice tonya, tonya's husband and six kids) Do you feel safe going back to the place where you live?  No Need for family participation in patient care:  Yes (Comment)  Care giving concerns:  Pt and pt's brother Jeneen Rinks (also a pt here in ED) had been living with their niece, Kenney Houseman, for the past five years. She has provided care for pt, in addition to pt's CAPs services and in-home nursing. Per Marcell Barlow and family (6 children, her husband, and pt and pt's sister) were all evicted yesterday, 03/06/15, and water was cut off. Per Kenney Houseman, they were given 10 days notice of eviction. When notified of eviction, Kenney Houseman began to seek housing for herself and family, and Kenney Houseman called APS for assistance. She worked with Hassan Rowan at [employer]. When eviction came yesterday, Hassan Rowan advised for Tonya to call EMS for pt and pt's sister to come to ED so "social worker could do an emergency placement."     Social Worker assessment / plan:  Education officer, museum will look for placement for patient. Discharging pt back  to niece is currently not safe because 1) not adequate caretaking space/environment as niece living in a hotel room with her six children and 2) they are insecurely housed.  Employment status:  Disabled (Comment on whether or not currently receiving Disability) (receives disability) Insurance information:  Medicaid In [place] PT Recommendations:    Information / Referral to community resources:  APS (Comment Required: [place], Name & Number of worker spoken with), [place] worker Paediatric nurse at [facility] [place]  Patient/Family's Response to care:  Pt expressed that she wanted assistance finding placement. Pt stated no specific preference for one particular facility. Tonya called hospital this morning to check-in on pt and to offer what help she can.  Patient/Family's Understanding of and Emotional Response to Diagnosis, Current Treatment, and Prognosis:  Pt expressed appreciation and optimism about CSW assisting with d/c planning. Tonya expressed thanks and relief that hospital will assist in discharge planning.  Emotional Assessment Appearance:  Appears older than stated age Attitude/Demeanor/Rapport:  Other (agreeable) Affect (typically observed):  Accepting, Happy Orientation:  Oriented to Self, Oriented to Place, Oriented to  Time, Oriented to Situation Alcohol / Substance use:  Not Applicable Psych involvement (Current and /or in the community):  No (Comment)  Discharge Needs  Concerns to be addressed:  Homelessness, Basic Needs Readmission within the last 30 days:  No Current discharge risk:  Dependent with Mobility, Inadequate Financial Supports, Homeless Barriers to Discharge:  Awaiting State Approval ([place])   [place], [place], LCSW 03/07/2015, 2:01  PM

## 2015-03-07 NOTE — ED Notes (Signed)
Patient's pocketbook placed in locker 30.

## 2015-03-07 NOTE — ED Provider Notes (Signed)
Awaiting placement. For many of the available sites available for placement via Adult Protective Services will require medical clearance TB test PT and OT evaluation for functional needs.  Tanna Furry, MD 03/07/15 [phone]

## 2015-03-07 NOTE — ED Notes (Signed)
Patient reported that she could transfer herself to bedside commode.  Patient attempted without assistance and urine went all over floor.  Let patient know that sitter and nurse would assist with transfer to bedside commode.  Patient reports she usually uses a wheelchair to get to bathroom.  Sitting up in bed feeding herself.  Alert and oriented.  Was not able to tell nurse what condition caused her to not be able to walk.  She was able to state that she has not been mobile on her own since 2002.  Reports right knee pain especially when it rains.  Uses Voltaren for pain in her knee.

## 2015-03-07 NOTE — ED Provider Notes (Signed)
Patient's labs obtained. Creatinine 2.04. History of diabetes hypertension and on Lasix. Creatinine 1.68 and 2009. I don't feel this is an appreciable change. Does take an ARB. No indication for acute intervention at this time, very likely a chronic renal insufficiency.  Tanna Furry, MD 03/07/15 1114

## 2015-03-07 NOTE — ED Notes (Signed)
Paged OT and PT at 1440.

## 2015-03-08 NOTE — Progress Notes (Addendum)
10:58am. CSW continues to search for SNF or ALF placement for pt. One current barrier to discharge is that the state website,  [place]UST, that authorizes placement has not been working this weekend. CSW spoke with patient's niece, Kenney Houseman, to update on situation. Kenney Houseman is attempting to contact other family members for assistance with placement or discharge, but so far no luck. CSW has also not been able to reach other family members (e.g. Stanton Kidney, sister, [phone]) . Also, per Kenney Houseman, pt DOES NOT have a developmental delay, as was documented in MDs note.  4:27pm. CSW received call from pt's sister, Johnetta Egerer [phone]). Stanton Kidney states that she would like for pt and her brother, Jeneen Rinks, to live with her. She currently lives in a top-floor apartment and does not feel like they could come with her today. However, she has already started looking housing and is willing to break her lease. Stanton Kidney says she worked as a Quarry manager for many years. Another d/c issue is that an APS case is open (which pt's niece had opened on herself). Stanton Kidney already aware of APS involvement, has their contact information, and will be in contact with APS in AM.   CSW discussed with pt possibility of going to live with [facility] after short term stay at placement facility. Pt stated that this sounded good to her, and like the best possible option so she could "stay in the family" and maintain independence.  [place] Worker [facility] phone: [phone]

## 2015-03-08 NOTE — ED Notes (Signed)
Pt to shower, linen changed on bed by tech.

## 2015-03-08 NOTE — Progress Notes (Signed)
03/08/15 1035  PT Visit Information  Last PT Received On 03/08/15  Assistance Needed +1  History of Present Illness Pt being cared for by dtr but evicted from home and dtr unable to continue to provide care in new circumstance.   Precautions  Precautions Fall  Restrictions  Weight Bearing Restrictions No  Home Living  Family/patient expects to be discharged to: Unsure  Additional Comments Evicted from home and dtr unable to continue to care for in new circumstance  Prior Function  Level of Independence Needs assistance  Gait / Transfers Assistance Needed Pt states walks minimally - a few feet to get to wc and only "with something to hang on to"  ADL's / [place] provided meals and performed house keeping  Communication  Communication No difficulties  Pain Assessment  Pain Assessment No/denies pain  Cognition  Arousal/Alertness Awake/alert  Behavior During Therapy WFL for tasks assessed/performed  Overall Cognitive Status Within Functional Limits for tasks assessed  Upper Extremity Assessment  Upper Extremity Assessment Generalized weakness  Lower Extremity Assessment  Lower Extremity Assessment Generalized weakness  Bed Mobility  General bed mobility comments NT - up with nursing and requests to stay in chair  Transfers  Overall transfer level Needs assistance  Equipment used Rolling walker (2 wheeled)  Transfers Sit to/from Stand  Sit to Stand Min guard  General transfer comment cues for use of UEs and min guard to steady on initial standing  Ambulation/Gait  Ambulation/Gait assistance Min assist  Ambulation Distance (Feet) 33 Feet  Assistive device Rolling walker (2 wheeled)  General Gait Details cues for posture and position from RW.  Pt fatigues easily from minimal exertion  Gait Pattern/deviations Step-through pattern;Decreased step length - right;Decreased step length - left;Shuffle;Trunk flexed;Wide base of support  Gait velocity decreased  with multiple short rests to complete task  Balance  Overall balance assessment Needs assistance  Sitting-balance support Feet supported  Sitting balance-Leahy Scale Normal  Standing balance support Bilateral upper extremity supported  Standing balance-Leahy Scale Fair  PT - End of Session  Activity Tolerance Patient tolerated treatment well;Patient limited by fatigue  Patient left in chair;with call bell/phone within reach;with nursing/sitter in room  Nurse Communication Mobility status  PT Assessment  PT Therapy Diagnosis  Difficulty walking  PT Recommendation/Assessment Patient needs continued PT services  PT Problem List Decreased strength;Decreased activity tolerance;Decreased balance;Decreased mobility;Decreased knowledge of use of DME;Obesity  Barriers to Discharge Decreased caregiver support  PT Plan  PT Frequency (ACUTE ONLY) Min 3X/week  PT Treatment/Interventions (ACUTE ONLY) DME instruction;Gait training;Functional mobility training;Therapeutic activities;Therapeutic exercise;Patient/family education  PT Recommendation  Follow Up Recommendations SNF  PT equipment Rolling walker with 5" wheels  Individuals Consulted  Consulted and Agree with Results and Recommendations Patient  Acute Rehab PT Goals  Patient Stated Goal Some place to live  PT Goal Formulation With patient  Time For Goal Achievement 03/22/15  Potential to Achieve Goals Good  PT Time Calculation  PT Start Time (ACUTE ONLY) 1035  PT Stop Time (ACUTE ONLY) 1050  PT Time Calculation (min) (ACUTE ONLY) 15 min  PT G-Codes **NOT FOR INPATIENT CLASS**  Functional Assessment Tool Used clinical judgement  Functional Limitation Mobility: Walking and moving around  Mobility: Walking and Moving Around Current Status [id number]) CJ  Mobility: Walking and Moving Around Goal Status [id number]) CI  PT General Charges  $$ ACUTE PT VISIT 1 Procedure  PT Evaluation  $Initial PT Evaluation Tier I 1 Procedure  Written Expression  Dominant Hand Right

## 2015-03-08 NOTE — ED Notes (Signed)
Meal tray given to pt.

## 2015-03-08 NOTE — ED Notes (Addendum)
PT in room assessing pt  

## 2015-03-09 ENCOUNTER — Encounter (HOSPITAL_COMMUNITY): Payer: Self-pay | Admitting: Emergency Medicine

## 2015-03-09 LAB — CBG MONITORING, ED: GLUCOSE-CAPILLARY: 72 mg/dL (ref 70–99)

## 2015-03-09 NOTE — ED Notes (Signed)
Social work at bedside.  

## 2015-03-09 NOTE — Progress Notes (Signed)
CSW was notified by Lynelle Smoke of [facility] that the pt has been accepted and is welcomed to come when discharged.  CSW made Nurse aware, who states transportation has been called. CSW reached out to pt's sister. However, she did not answer.  Willette Brace [id number] ED CSW 03/09/2015 4:54 PM

## 2015-03-09 NOTE — Progress Notes (Addendum)
Pt accepted to [facility] with letter of guaruntee. Pt sister Stanton Kidney to complete admission paperwork. Pt to be transported by ptar when snf bed available later today.   Belia Heman, [place] Work  [employer] [phone]

## 2015-03-09 NOTE — Progress Notes (Signed)
No return call from [facility] to ED CM at this time

## 2015-03-09 NOTE — ED Notes (Signed)
Resting in bed with eyes closed, respirations even and unlabored. Call light in reach.  Will continue to monitor for needs.

## 2015-03-09 NOTE — Progress Notes (Signed)
OT Evaluation  Clinical Impression: Pt primarily uses w/c at baseline. Pt requires mod A with LB ADL due to body habitus and would benefit from AE to maximize independence with ADL. Will follow acutely. Pt would benefit from rehab at SNF to maximize functional level of independence with self care and mobility for self care.    03/09/15 1000  OT Visit Information  Last OT Received On 03/09/15  Assistance Needed +1  History of Present Illness Pt being cared for by dtr but evicted from home and dtr unable to continue to provide care in new circumstance.   Precautions  Precautions Fall  Restrictions  Weight Bearing Restrictions No  Home Living  Family/patient expects to be discharged to: Unsure  Additional Comments Evicted from home and dtr unable to continue to care for in new circumstance  Prior Function  Level of Independence Needs assistance  Gait / Transfers Assistance Needed Pt states walks minimally - a few feet to get to wc and only "with something to hang on to"  ADL's / [place] provided meals and performed house keeping  Communication  Communication No difficulties  Pain Assessment  Pain Assessment No/denies pain  Cognition  Arousal/Alertness Awake/alert  Behavior During Therapy WFL for tasks assessed/performed  Overall Cognitive Status No family/caregiver present to determine baseline cognitive functioning  Memory (Apparently)  Upper Extremity Assessment  Upper Extremity Assessment Generalized weakness  Lower Extremity Assessment  Lower Extremity Assessment Generalized weakness  Cervical / Trunk Assessment  Cervical / Trunk Assessment Normal (appears to demonstrate lateral curvature)  ADL  Overall ADL's  Needs assistance/impaired  Eating/Feeding Independent  Grooming Set up  Upper Body Bathing Set up  Lower Body Bathing Moderate assistance  Lower Body Bathing Details (indicate cue type and reason) would benefit from AE  Upper Body Dressing   Set up  Lower Body Dressing Minimal assistance  Lower Body Dressing Details (indicate cue type and reason) would benefit from AE  Toilet Transfer Min guard  Toileting- Clothing Manipulation and Hygiene Minimal assistance;Bed level  Toileting - Clothing Manipulation Details (indicate cue type and reason) Pt unable to reach to clean after BM due to body habitus. Pt would benefit from use of AE for toileting  Functional mobility during ADLs Min guard  General ADL Comments Pt will benefit from [place] eof AE to increase independence with LB ADL and toileting.  Vision- Assessment  Additional Comments At basleine  Bed Mobility  Overal bed mobility Modified Independent  Transfers  Overall transfer level Needs assistance  Equipment used Rolling walker (2 wheeled)  Transfers Sit to/from Stand  Sit to Stand Min guard  Balance  Overall balance assessment Needs assistance  Sitting balance-Leahy Scale Good  Standing balance support During functional activity;Bilateral upper extremity supported  Standing balance-Leahy Scale Fair  OT - End of Session  Equipment Utilized During Treatment Gait belt;Rolling walker  Activity Tolerance Patient tolerated treatment well  Patient left in bed;with call bell/phone within reach  Nurse Communication Mobility status  OT Assessment  OT Therapy Diagnosis  Generalized weakness  OT Recommendation/Assessment Patient needs continued OT Services  OT Problem List Decreased activity tolerance;Decreased knowledge of use of DME or AE;Obesity  Barriers to Discharge Decreased caregiver support  Barriers to Discharge Comments unsure of living situation  OT Plan  OT Frequency (ACUTE ONLY) Min 2X/week  OT Treatment/Interventions (ACUTE ONLY) Self-care/ADL training;DME and/or AE instruction;Therapeutic activities;Patient/family education  OT Recommendation  Follow Up Recommendations SNF;Supervision/Assistance - 24 hour  OT Equipment Other (  comment) (ADL AE)  Individuals  Consulted  Consulted and Agree with Results and Recommendations Patient  Acute Rehab OT Goals  Patient Stated Goal Some place to live  OT Goal Formulation With patient  Time For Goal Achievement 03/23/15  Potential to Achieve Goals Good  OT Time Calculation  OT Start Time (ACUTE ONLY) 0930  OT Stop Time (ACUTE ONLY) 0948  OT Time Calculation (min) 18 min  OT G-codes **NOT FOR INPATIENT CLASS**  Functional Assessment Tool Used clinical judgement  Functional Limitation Self care  Self Care Current Status [id number]) CJ  Self Care Goal Status [id number]) CI  OT General Charges  $OT Visit 1 Procedure  OT Evaluation  $Initial OT Evaluation Tier I 1 Procedure  Written Expression  Dominant Hand Right  Maurie Boettcher, OTR/L  [phone] 03/09/2015

## 2015-03-09 NOTE — ED Notes (Signed)
OT at bedside. 

## 2015-03-11 ENCOUNTER — Non-Acute Institutional Stay (SKILLED_NURSING_FACILITY): Payer: Medicaid Other | Admitting: Adult Health

## 2015-03-11 DIAGNOSIS — E1129 Type 2 diabetes mellitus with other diabetic kidney complication: Secondary | ICD-10-CM | POA: Insufficient documentation

## 2015-03-11 DIAGNOSIS — N189 Chronic kidney disease, unspecified: Secondary | ICD-10-CM | POA: Diagnosis not present

## 2015-03-11 DIAGNOSIS — N183 Chronic kidney disease, stage 3 unspecified: Secondary | ICD-10-CM

## 2015-03-11 DIAGNOSIS — I10 Essential (primary) hypertension: Secondary | ICD-10-CM | POA: Diagnosis not present

## 2015-03-11 DIAGNOSIS — E785 Hyperlipidemia, unspecified: Secondary | ICD-10-CM | POA: Diagnosis not present

## 2015-03-11 DIAGNOSIS — E1122 Type 2 diabetes mellitus with diabetic chronic kidney disease: Secondary | ICD-10-CM

## 2015-03-11 DIAGNOSIS — M170 Bilateral primary osteoarthritis of knee: Secondary | ICD-10-CM | POA: Insufficient documentation

## 2015-03-11 DIAGNOSIS — E039 Hypothyroidism, unspecified: Secondary | ICD-10-CM

## 2015-03-11 NOTE — Progress Notes (Signed)
Patient ID: Kayla Hunt, female   DOB: 23-Sep-1951, 64 y.o.   MRN: [id number]  [facility]     No Known Allergies     Chief Complaint  Patient presents with  . Hospitalization Follow-up    HPI:  Kayla Hunt had been in the ED for placement purposes. Kayla Hunt is unable to care for herself at home. Kayla Hunt tells met that her goal is not to be here long term; but wanting alternative placement. Kayla Hunt tells me that Kayla Hunt is feeling good. There are no nursing concerns at this time.    Past Medical History  Diagnosis Date  . Diabetes mellitus   . Hypertension   . Hypothyroidism   . Hyperlipidemia   . Gout   . Chronic edema     bilat LE's  . CKD (chronic kidney disease)     No past surgical history on file.  VITAL SIGNS BP 129/70 mmHg  Pulse 76  Wt 314 lb (142.429 kg)   Outpatient Encounter Prescriptions as of 03/11/2015  Medication Sig  . diclofenac sodium (VOLTAREN) 1 % GEL Apply 2 g topically 4 (four) times daily as needed (knee pain).  . furosemide (LASIX) 20 MG tablet Take 20 mg by mouth daily.  Marland Kitchen glimepiride (AMARYL) 2 MG tablet Take 2 mg by mouth daily with breakfast.  . hydrALAZINE (APRESOLINE) 25 MG tablet Take 25 mg by mouth 3 (three) times daily.  Marland Kitchen levothyroxine (SYNTHROID, LEVOTHROID) 112 MCG tablet Take 112 mcg by mouth daily before breakfast.  . losartan-hydrochlorothiazide (HYZAAR) 100-12.5 MG per tablet Take 1 tablet by mouth daily.  . metoprolol succinate (TOPROL-XL) 25 MG 24 hr tablet Take 25 mg by mouth daily.   . promethazine (PHENERGAN) 12.5 MG tablet Take 12.5 mg by mouth every 6 (six) hours as needed for nausea or vomiting.  . simvastatin (ZOCOR) 20 MG tablet Take 20 mg by mouth daily.      SIGNIFICANT DIAGNOSTIC EXAMS  None recent  LABS REVIEWED:   03-06-15: wbc 7.1; hgb 11.5; hct 38.3; mcv 92.5; plt 254; glucose 166; bun 40; creat 2.04; k+4.4; na++139; liver normal albumin 3.3     Review of Systems  Constitutional: Negative for malaise/fatigue.    Respiratory: Negative for cough and shortness of breath.   Cardiovascular: Negative for chest pain, palpitations and leg swelling.  Gastrointestinal: Positive for diarrhea. Negative for heartburn, abdominal pain and constipation.       Gets diarrhea from foods at times   Musculoskeletal: Negative for myalgias and joint pain.  Skin: Negative.   Neurological: Negative for weakness and headaches.  Psychiatric/Behavioral: The patient is not nervous/anxious.      Physical Exam  Constitutional: Kayla Hunt is oriented to person, place, and time. No distress.  Morbidly obese   Neck: Neck supple. No JVD present. No thyromegaly present.  Cardiovascular: Normal rate, regular rhythm and intact distal pulses.   Respiratory: Effort normal and breath sounds normal. No respiratory distress.  GI: Soft. Bowel sounds are normal. Kayla Hunt exhibits no distension. There is no tenderness.  Musculoskeletal: Normal range of motion. Kayla Hunt exhibits no edema.  Neurological: Kayla Hunt is alert and oriented to person, place, and time.  Skin: Skin is warm and dry. Kayla Hunt is not diaphoretic.       ASSESSMENT/ PLAN:  1. Hypothyroidism: will continue synthroid 112 mcg daily and will monitor   2. Dyslipidemia: will continue zocor 20 mg daily   3. Edema: is stable will continue lasix 20 mg daily   4. Hypertension: will continue hydralazine 25  mg three times daily; toprol xl 25 mg daily; hyzaar 100/12.5 mg daily will monitor  5. Diabetes: will continue amaryl 2 mg daily   6. Osteoarthritis: will continue voltaren gel 2 gm to knees every 6 hours as needed will monitor   7. CKD stage III: her creat is 2.04; will monitor    Will check hgb a1c; lipids and tsh    Time spent with patient 50 minutes.   Ok Edwards NP [facility]  Contact [phone] Monday through Friday 8am- 5pm  After hours call [phone]

## 2015-03-12 ENCOUNTER — Non-Acute Institutional Stay (SKILLED_NURSING_FACILITY): Payer: Medicaid Other | Admitting: Internal Medicine

## 2015-03-12 DIAGNOSIS — E039 Hypothyroidism, unspecified: Secondary | ICD-10-CM | POA: Diagnosis not present

## 2015-03-12 DIAGNOSIS — I10 Essential (primary) hypertension: Secondary | ICD-10-CM | POA: Diagnosis not present

## 2015-03-12 DIAGNOSIS — N183 Chronic kidney disease, stage 3 unspecified: Secondary | ICD-10-CM

## 2015-03-12 DIAGNOSIS — N189 Chronic kidney disease, unspecified: Secondary | ICD-10-CM | POA: Diagnosis not present

## 2015-03-12 DIAGNOSIS — F79 Unspecified intellectual disabilities: Secondary | ICD-10-CM

## 2015-03-12 DIAGNOSIS — R7611 Nonspecific reaction to tuberculin skin test without active tuberculosis: Secondary | ICD-10-CM | POA: Diagnosis not present

## 2015-03-12 DIAGNOSIS — M170 Bilateral primary osteoarthritis of knee: Secondary | ICD-10-CM

## 2015-03-12 DIAGNOSIS — E785 Hyperlipidemia, unspecified: Secondary | ICD-10-CM

## 2015-03-12 DIAGNOSIS — E1122 Type 2 diabetes mellitus with diabetic chronic kidney disease: Secondary | ICD-10-CM

## 2015-04-16 ENCOUNTER — Non-Acute Institutional Stay (SKILLED_NURSING_FACILITY): Payer: Medicaid Other | Admitting: Internal Medicine

## 2015-04-16 DIAGNOSIS — F79 Unspecified intellectual disabilities: Secondary | ICD-10-CM

## 2015-04-16 DIAGNOSIS — M170 Bilateral primary osteoarthritis of knee: Secondary | ICD-10-CM | POA: Diagnosis not present

## 2015-04-16 DIAGNOSIS — E785 Hyperlipidemia, unspecified: Secondary | ICD-10-CM

## 2015-04-16 DIAGNOSIS — I10 Essential (primary) hypertension: Secondary | ICD-10-CM | POA: Diagnosis not present

## 2015-04-16 DIAGNOSIS — E039 Hypothyroidism, unspecified: Secondary | ICD-10-CM | POA: Diagnosis not present

## 2015-04-16 DIAGNOSIS — E1122 Type 2 diabetes mellitus with diabetic chronic kidney disease: Secondary | ICD-10-CM

## 2015-04-16 NOTE — Progress Notes (Signed)
Patient ID: Kayla Hunt, female   DOB: 10-09-1951, 64 y.o.   MRN: [id number]    DATE: 04/16/15  Location:  [facility]    Place of Service: SNF [phone])   Extended Emergency Contact Information Primary Emergency Contact: [facility] Address: [address]          [place], [place] [address] Johnnette Litter of [place] Phone: [phone] Relation: Daughter  Advanced Directive information  FULL CODE  Chief Complaint  Patient presents with  . Discharge Note    HPI:  64 yo female see today for d/c from SNF following short term rehab for placement. She is mentally challenged and unable to provide further HPI. Hx obtained from chart. She has no c/o. No falls since admission. Appetite is excellent. No nursing issues.  Labs 03/12/15: TSH 1.06, A1c 6.3%, total cholesterol 164, TG 75, HDL 41 and LDL 109  Past Medical History  Diagnosis Date  . Diabetes mellitus   . Hypertension   . Hypothyroidism   . Hyperlipidemia   . Gout   . Chronic edema     bilat LE's  . CKD (chronic kidney disease)     No past surgical history on file.  Patient Care Team: Reymundo Poll, MD as PCP - General (Family Medicine)  History   Social History  . Marital Status: Single    Spouse Name: N/A  . Number of Children: N/A  . Years of Education: N/A   Occupational History  . Not on file.   Social History Main Topics  . Smoking status: Not on file  . Smokeless tobacco: Not on file  . Alcohol Use: Not on file  . Drug Use: Not on file  . Sexual Activity: Not on file   Other Topics Concern  . Not on file   Social History Narrative     has no tobacco, alcohol, and drug history on file.  Immunization History  Administered Date(s) Administered  . Influenza Whole 10/02/2007  . PPD Test 03/07/2015    No Known Allergies  Medications: Patient's Medications  New Prescriptions   No medications on file  Previous Medications   DICLOFENAC SODIUM (VOLTAREN) 1 % GEL     Apply 2 g topically 4 (four) times daily as needed (knee pain).   FUROSEMIDE (LASIX) 20 MG TABLET    Take 20 mg by mouth daily.   GLIMEPIRIDE (AMARYL) 2 MG TABLET    Take 2 mg by mouth daily with breakfast.   HYDRALAZINE (APRESOLINE) 25 MG TABLET    Take 25 mg by mouth 3 (three) times daily.   LEVOTHYROXINE (SYNTHROID, LEVOTHROID) 112 MCG TABLET    Take 112 mcg by mouth daily before breakfast.   LOSARTAN-HYDROCHLOROTHIAZIDE (HYZAAR) 100-12.5 MG PER TABLET    Take 1 tablet by mouth daily.   METOPROLOL SUCCINATE (TOPROL-XL) 25 MG 24 HR TABLET    Take 25 mg by mouth daily.    PROMETHAZINE (PHENERGAN) 12.5 MG TABLET    Take 12.5 mg by mouth every 6 (six) hours as needed for nausea or vomiting.   SIMVASTATIN (ZOCOR) 20 MG TABLET    Take 20 mg by mouth daily.  Modified Medications   No medications on file  Discontinued Medications   No medications on file    Review of Systems  Unable to perform ROS: Psychiatric disorder    Filed Vitals:   04/16/15 1822  BP: 109/76   There is no weight on file to calculate BMI.  Physical Exam  Constitutional:  She appears well-developed and well-nourished.  HENT:  Mouth/Throat: Oropharynx is clear and moist. No oropharyngeal exudate.  Eyes: Pupils are equal, round, and reactive to light. No scleral icterus.  Neck: Neck supple. Carotid bruit is not present. No tracheal deviation present. No thyromegaly present.  Cardiovascular: Normal rate, regular rhythm, normal heart sounds and intact distal pulses.  Exam reveals no gallop and no friction rub.   No murmur heard. +1 pitting LE edema b/l. No calf TTP  Pulmonary/Chest: Effort normal and breath sounds normal. No stridor. No respiratory distress. She has no wheezes. She has no rales.  Abdominal: Soft. Bowel sounds are normal. She exhibits no distension and no mass. There is no hepatomegaly. There is no tenderness. There is no rebound and no guarding.  Musculoskeletal: She exhibits edema and tenderness.    Lymphadenopathy:    She has no cervical adenopathy.  Neurological: She is alert.  Skin: Skin is warm and dry. No rash noted.  Psychiatric: She has a normal mood and affect. Her behavior is normal.     Labs reviewed: Admission on 03/06/2015, Discharged on 03/09/2015  Component Date Value Ref Range Status  . Glucose-Capillary 03/06/2015 85  70 - 99 mg/dL Final  . Glucose-Capillary 03/06/2015 111* 70 - 99 mg/dL Final  . Comment 1 03/06/2015 Notify RN   Final  . Comment 2 03/06/2015 Call MD NNP PA CNM   Final  . Glucose-Capillary 03/07/2015 70  70 - 99 mg/dL Final  . WBC 03/07/2015 7.1  4.0 - 10.5 K/uL Final  . RBC 03/07/2015 4.14  3.87 - 5.11 MIL/uL Final  . Hemoglobin 03/07/2015 11.5* 12.0 - 15.0 g/dL Final  . HCT 03/07/2015 38.3  36.0 - 46.0 % Final  . MCV 03/07/2015 92.5  78.0 - 100.0 fL Final  . MCH 03/07/2015 27.8  26.0 - 34.0 pg Final  . MCHC 03/07/2015 30.0  30.0 - 36.0 g/dL Final  . RDW 03/07/2015 16.2* 11.5 - 15.5 % Final  . Platelets 03/07/2015 251  150 - 400 K/uL Final  . Neutrophils Relative % 03/07/2015 56  43 - 77 % Final  . Neutro Abs 03/07/2015 4.0  1.7 - 7.7 K/uL Final  . Lymphocytes Relative 03/07/2015 37  12 - 46 % Final  . Lymphs Abs 03/07/2015 2.6  0.7 - 4.0 K/uL Final  . Monocytes Relative 03/07/2015 6  3 - 12 % Final  . Monocytes Absolute 03/07/2015 0.5  0.1 - 1.0 K/uL Final  . Eosinophils Relative 03/07/2015 1  0 - 5 % Final  . Eosinophils Absolute 03/07/2015 0.1  0.0 - 0.7 K/uL Final  . Basophils Relative 03/07/2015 0  0 - 1 % Final  . Basophils Absolute 03/07/2015 0.0  0.0 - 0.1 K/uL Final  . Sodium 03/07/2015 139  135 - 145 mmol/L Final  . Potassium 03/07/2015 4.4  3.5 - 5.1 mmol/L Final  . Chloride 03/07/2015 108  101 - 111 mmol/L Final  . CO2 03/07/2015 23  22 - 32 mmol/L Final  . Glucose, Bld 03/07/2015 166* 70 - 99 mg/dL Final  . BUN 03/07/2015 40* 6 - 20 mg/dL Final  . Creatinine, Ser 03/07/2015 2.04* 0.44 - 1.00 mg/dL Final  . Calcium  03/07/2015 8.9  8.9 - 10.3 mg/dL Final  . Total Protein 03/07/2015 7.8  6.5 - 8.1 g/dL Final  . Albumin 03/07/2015 3.3* 3.5 - 5.0 g/dL Final  . AST 03/07/2015 24  15 - 41 U/L Final  . ALT 03/07/2015 14  14 - 54  U/L Final  . Alkaline Phosphatase 03/07/2015 94  38 - 126 U/L Final  . Total Bilirubin 03/07/2015 0.5  0.3 - 1.2 mg/dL Final  . GFR calc non Af Amer 03/07/2015 25* >60 mL/min Final  . GFR calc Af Amer 03/07/2015 29* >60 mL/min Final   Comment: (NOTE) The eGFR has been calculated using the CKD EPI equation. This calculation has not been validated in all clinical situations. eGFR's persistently <60 mL/min signify possible Chronic Kidney Disease.   . Anion gap 03/07/2015 8  5 - 15 Final  . Color, Urine 03/07/2015 YELLOW  YELLOW Final  . APPearance 03/07/2015 CLOUDY* CLEAR Final  . Specific Gravity, Urine 03/07/2015 1.012  1.005 - 1.030 Final  . pH 03/07/2015 5.5  5.0 - 8.0 Final  . Glucose, UA 03/07/2015 NEGATIVE  NEGATIVE mg/dL Final  . Hgb urine dipstick 03/07/2015 NEGATIVE  NEGATIVE Final  . Bilirubin Urine 03/07/2015 NEGATIVE  NEGATIVE Final  . Ketones, ur 03/07/2015 NEGATIVE  NEGATIVE mg/dL Final  . Protein, ur 03/07/2015 30* NEGATIVE mg/dL Final  . Urobilinogen, UA 03/07/2015 0.2  0.0 - 1.0 mg/dL Final  . Nitrite 03/07/2015 NEGATIVE  NEGATIVE Final  . Leukocytes, UA 03/07/2015 NEGATIVE  NEGATIVE Final  . Squamous Epithelial / LPF 03/07/2015 RARE  RARE Final  . Bacteria, UA 03/07/2015 FEW* RARE Final  . Glucose-Capillary 03/07/2015 76  70 - 99 mg/dL Final  . Glucose-Capillary 03/07/2015 86  70 - 99 mg/dL Final  . Glucose-Capillary 03/09/2015 72  70 - 99 mg/dL Final    No results found.   Assessment/Plan    ICD-9-CM ICD-10-CM   1. Type 2 diabetes mellitus with chronic kidney disease, without long-term current use of insulin, unspecified CKD stage ([place]) - controlled 250.40 E11.22    585.9    2. Essential hypertension, benign - stable 401.1 I10   3. Hypothyroidism,  unspecified hypothyroidism type -stable 244.9 E03.9   4. Dyslipidemia - stable 272.4 E78.5   5. Primary osteoarthritis of both knees - stable 715.16 M17.0   6. Mentally challenged [place] current meds as ordered. FL2 form completed and signed. Pt d/c'd to retirement home. She will need to f/u with PCP at retirement home  [place]: [place]. Perlie Gold  [facility] [address] [place], [place] [address] [phone] Cell (Monday-Friday 8 AM - 5 PM) [phone] After 5 PM and follow prompts

## 2015-07-27 ENCOUNTER — Encounter: Payer: Self-pay | Admitting: Internal Medicine

## 2015-07-27 NOTE — Progress Notes (Signed)
Patient ID: Kayla Hunt, female   DOB: 04/08/1951, 63 y.o.   MRN: [id number]    HISTORY AND PHYSICAL   DATE: 03/12/15  Location:  [facility]    Place of Service: SNF (31)   Extended Emergency Contact Information Primary Emergency Contact: Goodman,Tonya Address: [address]          [place], [place] [address] [place] Home Phone: [phone] Relation: Daughter  Advanced Directive information  FULL CODE  Chief Complaint  Patient presents with  . New Admit To SNF    HPI:  63 yo female seen today as a new admission into SNF from ER for placement. She was living with her daughter who was evicted and pt is now homeless. She has no specific c/o. She is mentally challenged and unable to provide additional hx. Hx obtained from chart.  DM - control status unknown. She takes amaryl  HTN - controlled on lasix, hydralazine, losartan and toprol xl  Thyroid - stable on levothyroxine  Hyperlipidemia - on statin tx  Knee pain - stable on voltaren gel  Past Medical History  Diagnosis Date  . Diabetes mellitus   . Hypertension   . Hypothyroidism   . Hyperlipidemia   . Gout   . Chronic edema     bilat LE's  . CKD (chronic kidney disease)     No past surgical history on file.  Patient Care Team: Henry Tripp, MD as PCP - General (Family Medicine)  Social History   Social History  . Marital Status: Single    Spouse Name: N/A  . Number of Children: N/A  . Years of Education: N/A   Occupational History  . Not on file.   Social History Main Topics  . Smoking status: Unknown If Ever Smoked  . Smokeless tobacco: Not on file  . Alcohol Use: Not on file  . Drug Use: Not on file  . Sexual Activity: Not on file   Other Topics Concern  . Not on file   Social History Narrative     has no tobacco, alcohol, and drug history on file.  No family history on file. No family status information on file.    Immunization History    Administered Date(s) Administered  . Influenza Whole 10/02/2007  . PPD Test 03/07/2015    No Known Allergies  Medications: Patient's Medications  New Prescriptions   No medications on file  Previous Medications   DICLOFENAC SODIUM (VOLTAREN) 1 % GEL    Apply 2 g topically 4 (four) times daily as needed (knee pain).   FUROSEMIDE (LASIX) 20 MG TABLET    Take 20 mg by mouth daily.   GLIMEPIRIDE (AMARYL) 2 MG TABLET    Take 2 mg by mouth daily with breakfast.   HYDRALAZINE (APRESOLINE) 25 MG TABLET    Take 25 mg by mouth 3 (three) times daily.   LEVOTHYROXINE (SYNTHROID, LEVOTHROID) 112 MCG TABLET    Take 112 mcg by mouth daily before breakfast.   LOSARTAN-HYDROCHLOROTHIAZIDE (HYZAAR) 100-12.5 MG PER TABLET    Take 1 tablet by mouth daily.   METOPROLOL SUCCINATE (TOPROL-XL) 25 MG 24 HR TABLET    Take 25 mg by mouth daily.    PROMETHAZINE (PHENERGAN) 12.5 MG TABLET    Take 12.5 mg by mouth every 6 (six) hours as needed for nausea or vomiting.   SIMVASTATIN (ZOCOR) 20 MG TABLET    Take 20 mg by mouth daily.  Modified Medications   No medications   on file  Discontinued Medications   No medications on file    Review of Systems  Unable to perform ROS: Psychiatric disorder    Filed Vitals:   03/12/15 2216  BP: 130/68  Pulse: 74  Temp: 96.9 F (36.1 C)   There is no weight on file to calculate BMI.  Physical Exam  Constitutional: She appears well-developed and well-nourished. No distress.  Looks well in NAD. sitting on edge of bed  HENT:  Mouth/Throat: Oropharynx is clear and moist. No oropharyngeal exudate.  Eyes: Pupils are equal, round, and reactive to light. No scleral icterus.  Neck: Neck supple. Carotid bruit is not present. No tracheal deviation present. No thyromegaly present.  Cardiovascular: Normal rate, regular rhythm and intact distal pulses.  Exam reveals no gallop and no friction rub.   Murmur (1/6 SEM) heard. +1 pitting LE edema b/l. no calf TTP.    Pulmonary/Chest: Effort normal and breath sounds normal. No stridor. No respiratory distress. She has no wheezes. She has no rales.  Abdominal: Soft. Bowel sounds are normal. She exhibits no distension and no mass. There is no hepatomegaly. There is no tenderness. There is no rebound and no guarding.  Musculoskeletal: She exhibits edema and tenderness.  Lymphadenopathy:    She has no cervical adenopathy.  Neurological: She is alert.  Skin: Skin is warm and dry. No rash noted.  Left anterior leg eschar. No purulent d/c, redness or swelling  Psychiatric: She has a normal mood and affect. Her behavior is normal.     Labs reviewed: Admission on 03/06/2015, Discharged on 03/09/2015  Component Date Value Ref Range Status  . Glucose-Capillary 03/06/2015 85  70 - 99 mg/dL Final  . Glucose-Capillary 03/06/2015 111* 70 - 99 mg/dL Final  . Comment 1 03/06/2015 Notify RN   Final  . Comment 2 03/06/2015 Call MD NNP PA CNM   Final  . Glucose-Capillary 03/07/2015 70  70 - 99 mg/dL Final  . WBC 03/07/2015 7.1  4.0 - 10.5 K/uL Final  . RBC 03/07/2015 4.14  3.87 - 5.11 MIL/uL Final  . Hemoglobin 03/07/2015 11.5* 12.0 - 15.0 g/dL Final  . HCT 03/07/2015 38.3  36.0 - 46.0 % Final  . MCV 03/07/2015 92.5  78.0 - 100.0 fL Final  . MCH 03/07/2015 27.8  26.0 - 34.0 pg Final  . MCHC 03/07/2015 30.0  30.0 - 36.0 g/dL Final  . RDW 03/07/2015 16.2* 11.5 - 15.5 % Final  . Platelets 03/07/2015 251  150 - 400 K/uL Final  . Neutrophils Relative % 03/07/2015 56  43 - 77 % Final  . Neutro Abs 03/07/2015 4.0  1.7 - 7.7 K/uL Final  . Lymphocytes Relative 03/07/2015 37  12 - 46 % Final  . Lymphs Abs 03/07/2015 2.6  0.7 - 4.0 K/uL Final  . Monocytes Relative 03/07/2015 6  3 - 12 % Final  . Monocytes Absolute 03/07/2015 0.5  0.1 - 1.0 K/uL Final  . Eosinophils Relative 03/07/2015 1  0 - 5 % Final  . Eosinophils Absolute 03/07/2015 0.1  0.0 - 0.7 K/uL Final  . Basophils Relative 03/07/2015 0  0 - 1 % Final  .  Basophils Absolute 03/07/2015 0.0  0.0 - 0.1 K/uL Final  . Sodium 03/07/2015 139  135 - 145 mmol/L Final  . Potassium 03/07/2015 4.4  3.5 - 5.1 mmol/L Final  . Chloride 03/07/2015 108  101 - 111 mmol/L Final  . CO2 03/07/2015 23  22 - 32 mmol/L Final  . Glucose,  Bld 03/07/2015 166* 70 - 99 mg/dL Final  . BUN 03/07/2015 40* 6 - 20 mg/dL Final  . Creatinine, Ser 03/07/2015 2.04* 0.44 - 1.00 mg/dL Final  . Calcium 03/07/2015 8.9  8.9 - 10.3 mg/dL Final  . Total Protein 03/07/2015 7.8  6.5 - 8.1 g/dL Final  . Albumin 03/07/2015 3.3* 3.5 - 5.0 g/dL Final  . AST 03/07/2015 24  15 - 41 U/L Final  . ALT 03/07/2015 14  14 - 54 U/L Final  . Alkaline Phosphatase 03/07/2015 94  38 - 126 U/L Final  . Total Bilirubin 03/07/2015 0.5  0.3 - 1.2 mg/dL Final  . GFR calc non Af Amer 03/07/2015 25* >60 mL/min Final  . GFR calc Af Amer 03/07/2015 29* >60 mL/min Final   Comment: (NOTE) The eGFR has been calculated using the CKD EPI equation. This calculation has not been validated in all clinical situations. eGFR's persistently <60 mL/min signify possible Chronic Kidney Disease.   . Anion gap 03/07/2015 8  5 - 15 Final  . Color, Urine 03/07/2015 YELLOW  YELLOW Final  . APPearance 03/07/2015 CLOUDY* CLEAR Final  . Specific Gravity, Urine 03/07/2015 1.012  1.005 - 1.030 Final  . pH 03/07/2015 5.5  5.0 - 8.0 Final  . Glucose, UA 03/07/2015 NEGATIVE  NEGATIVE mg/dL Final  . Hgb urine dipstick 03/07/2015 NEGATIVE  NEGATIVE Final  . Bilirubin Urine 03/07/2015 NEGATIVE  NEGATIVE Final  . Ketones, ur 03/07/2015 NEGATIVE  NEGATIVE mg/dL Final  . Protein, ur 03/07/2015 30* NEGATIVE mg/dL Final  . Urobilinogen, UA 03/07/2015 0.2  0.0 - 1.0 mg/dL Final  . Nitrite 03/07/2015 NEGATIVE  NEGATIVE Final  . Leukocytes, UA 03/07/2015 NEGATIVE  NEGATIVE Final  . Squamous Epithelial / LPF 03/07/2015 RARE  RARE Final  . Bacteria, UA 03/07/2015 FEW* RARE Final  . Glucose-Capillary 03/07/2015 76  70 - 99 mg/dL Final  .  Glucose-Capillary 03/07/2015 86  70 - 99 mg/dL Final  . Glucose-Capillary 03/09/2015 72  70 - 99 mg/dL Final    No results found.   Assessment/Plan   ICD-9-CM ICD-10-CM   1. Essential hypertension, benign - stable 401.1 I10   2. 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 Type 2 diabetes mellitus with diabetic chronic kidney disease - stable 250.40 E11.22    585.9 N18.9   4. Hypothyroidism, unspecified hypothyroidism type - stable 244.9 E03.9   5. CHRONIC KIDNEY DISEASE STAGE III (MODERATE) - stable 585.3 N18.3   6. Dyslipidemia - stable 272.4 E78.5   7. Primary osteoarthritis of both knees - stable 715.16 M17.0   8. Mentally challenged 319 F79     --check CXR  for (+) TB skin test  --check lipid panel, A1c, and TSH  --PT/OT/ST as indicated  --cont current meds as ordered  --GOAL: short term rehab and d/c home when medically appropriate. Communicated with pt and nursing.  --will follow  Monica S. Carter, D. O., F. A. C. O. I.  [facility] [address] [place], [place] [address] [phone] Cell (Monday-Friday 8 AM - 5 PM) [phone] After 5 PM and follow prompts  

## 2015-09-29 ENCOUNTER — Encounter: Payer: Self-pay | Admitting: Internal Medicine

## 2017-12-27 ENCOUNTER — Emergency Department (HOSPITAL_COMMUNITY): Payer: Medicaid Other

## 2017-12-27 ENCOUNTER — Encounter (HOSPITAL_COMMUNITY): Payer: Self-pay | Admitting: Emergency Medicine

## 2017-12-27 ENCOUNTER — Emergency Department (HOSPITAL_COMMUNITY)
Admission: EM | Admit: 2017-12-27 | Discharge: 2017-12-27 | Disposition: A | Discharge status: Home / Self Care | Payer: Medicaid Other | Attending: Emergency Medicine | Admitting: Emergency Medicine

## 2017-12-27 DIAGNOSIS — M79641 Pain in right hand: Secondary | ICD-10-CM | POA: Diagnosis present

## 2017-12-27 DIAGNOSIS — E039 Hypothyroidism, unspecified: Secondary | ICD-10-CM | POA: Insufficient documentation

## 2017-12-27 DIAGNOSIS — N189 Chronic kidney disease, unspecified: Secondary | ICD-10-CM | POA: Diagnosis not present

## 2017-12-27 DIAGNOSIS — F70 Mild intellectual disabilities: Secondary | ICD-10-CM | POA: Diagnosis not present

## 2017-12-27 DIAGNOSIS — E1122 Type 2 diabetes mellitus with diabetic chronic kidney disease: Secondary | ICD-10-CM | POA: Diagnosis not present

## 2017-12-27 DIAGNOSIS — I129 Hypertensive chronic kidney disease with stage 1 through stage 4 chronic kidney disease, or unspecified chronic kidney disease: Secondary | ICD-10-CM | POA: Diagnosis not present

## 2017-12-27 DIAGNOSIS — M109 Gout, unspecified: Secondary | ICD-10-CM | POA: Diagnosis not present

## 2017-12-27 DIAGNOSIS — Z79899 Other long term (current) drug therapy: Secondary | ICD-10-CM | POA: Diagnosis not present

## 2017-12-27 DIAGNOSIS — F17220 Nicotine dependence, chewing tobacco, uncomplicated: Secondary | ICD-10-CM | POA: Diagnosis not present

## 2017-12-27 MED ORDER — COLCHICINE 0.6 MG PO TABS: ORAL_TABLET | ORAL | 0 refills | Status: DC

## 2017-12-27 NOTE — ED Triage Notes (Signed)
Pt arrives via EMS from [facility] with complaints of right wrist pain for 2 days. Facility MD sent pt for eval of possible PE due to pt being SOB on exam. Pt denies SOB, hx of Afib or blood clots.

## 2017-12-27 NOTE — ED Notes (Signed)
Patient transported to X-ray 

## 2017-12-27 NOTE — ED Provider Notes (Signed)
Patient seen and evaluated.  Discussed with [employer].  Patient with erythema and tenderness along fourth and fifth metacarpals.  Not hot to the touch.  Afebrile.  X-ray without acute findings.  Possible/probable gout.--Patient with history.  No concern about any pulmonary process.  Clear lungs.  Afebrile.  Heart rate 70.  Saturating 98%.  Plan anti-inflammatories and primary care follow-up.   Tanna Furry, MD 12/27/17 1320

## 2017-12-27 NOTE — ED Provider Notes (Signed)
[facility] EMERGENCY DEPARTMENT Provider Note   CSN: [id number] Arrival date & time: 12/27/17  1208     History   Chief Complaint Chief Complaint  Patient presents with  . Wrist Pain    HPI Kayla Hunt is a 67 y.o. female with a PMHx of chronic edema, CKD, DM2, HLD, HTN, gout, hypothyroidism, mild mental retardation, and other conditions listed below, who presents to the ED with complaints of right hand pain and swelling x 2 days.  Patient describes the pain as 5/10 constant dull nonradiating right hand that worsens with movement or use of the hand and has been unrelieved with Tylenol.  She reports associated swelling, erythema, and warmth to the hand.  She denies any injuries or trauma of the hand, and denies any wounds or abrasions to the hand.  She denies any alcohol use or red meat consumption, however she reports that she ate fish on Friday and this started a few days later.  She has had a history of gout in her foot before, states that that felt that more severe than this does today.  As far she knows she is not on anything for gout, she only takes medications for diabetes.  She denies fevers, chills, CP, SOB, abd pain, N/V/D/C, hematuria, dysuria, numbness, tingling, focal weakness, or any other complaints at this time.    The history is provided by the patient and medical records. No language interpreter was used.  Hand Pain  This is a new problem. The current episode started 2 days ago. The problem occurs constantly. The problem has not changed since onset.Pertinent negatives include no chest pain, no abdominal pain and no shortness of breath. Exacerbated by: use of hand. Nothing relieves the symptoms. Treatments tried: tylenol. The treatment provided no relief.    Past Medical History:  Diagnosis Date  . Chronic edema    bilat LE's  . CKD (chronic kidney disease)   . Diabetes mellitus   . Gout   . Hyperlipidemia   . Hypertension   . Hypothyroidism      Patient Active Problem List   Diagnosis Date Noted  . Type II diabetes mellitus with renal manifestations ([place]) 03/11/2015  . Osteoarthritis of both knees 03/11/2015  . Dyslipidemia 10/02/2007  . MILD MENTAL RETARDATION 10/02/2007  . DEAFNESS, UNILATERAL 10/02/2007  . SECONDARY HYPERPARATHYROIDISM 01/04/2007  . CHRONIC KIDNEY DISEASE STAGE III (MODERATE) 11/27/2004  . GOUT, UNSPECIFIED 01/06/2003  . Hypothyroidism 05/28/1998  . ESSENTIAL HYPERTENSION, BENIGN 04/15/1998    History reviewed. No pertinent surgical history.  OB History    No data available       Home Medications    Prior to Admission medications   Medication Sig Start Date End Date Taking? Authorizing Provider  diclofenac sodium (VOLTAREN) 1 % GEL Apply 2 g topically 4 (four) times daily as needed (knee pain).    [provider]  furosemide (LASIX) 20 MG tablet Take 20 mg by mouth daily.    [provider]  glimepiride (AMARYL) 2 MG tablet Take 2 mg by mouth daily with breakfast.    [provider]  hydrALAZINE (APRESOLINE) 25 MG tablet Take 25 mg by mouth 3 (three) times daily.    [provider]  levothyroxine (SYNTHROID, LEVOTHROID) 112 MCG tablet Take 112 mcg by mouth daily before breakfast.    [provider]  losartan-hydrochlorothiazide (HYZAAR) 100-12.5 MG per tablet Take 1 tablet by mouth daily.    [provider]  metoprolol succinate (  TOPROL-XL) 25 MG 24 hr tablet Take 25 mg by mouth daily.  02/24/15   [provider]  promethazine (PHENERGAN) 12.5 MG tablet Take 12.5 mg by mouth every 6 (six) hours as needed for nausea or vomiting.    [provider]  simvastatin (ZOCOR) 20 MG tablet Take 20 mg by mouth daily.    [provider]    Family History No family history on file.  Social History Social History   Tobacco Use  . Smoking status: Unknown If Ever Smoked  . Smokeless tobacco: Current User    Types: Snuff   Substance Use Topics  . Alcohol use: No    Frequency: Never  . Drug use: No     Allergies   Patient has no known allergies.   Review of Systems Review of Systems  Constitutional: Negative for chills and fever.  Respiratory: Negative for shortness of breath.   Cardiovascular: Negative for chest pain.  Gastrointestinal: Negative for abdominal pain, constipation, diarrhea, nausea and vomiting.  Genitourinary: Negative for dysuria and hematuria.  Musculoskeletal: Positive for arthralgias, joint swelling and myalgias.  Skin: Positive for color change. Negative for wound.  Allergic/Immunologic: Positive for immunocompromised state (DM2).  Neurological: Negative for weakness and numbness.  Psychiatric/Behavioral: Negative for confusion.   All other systems reviewed and are negative for acute change except as noted in the HPI.    Physical Exam Updated Vital Signs BP (!) 165/79 (BP Location: Left Wrist)   Pulse 74   Temp 98.5 F (36.9 C) (Oral)   Ht 5\' 2"  (1.575 m)   SpO2 96%   BMI 57.43 kg/m   Physical Exam  Constitutional: She is oriented to person, place, and time. Vital signs are normal. She appears well-developed and well-nourished.  Non-toxic appearance. No distress.  Afebrile, nontoxic, NAD  HENT:  Head: Normocephalic and atraumatic.  Mouth/Throat: Oropharynx is clear and moist and mucous membranes are normal.  Eyes: Conjunctivae and EOM are normal. Right eye exhibits no discharge. Left eye exhibits no discharge.  Neck: Normal range of motion. Neck supple.  Cardiovascular: Normal rate, regular rhythm, normal heart sounds and intact distal pulses. Exam reveals no gallop and no friction rub.  No murmur heard. Pulmonary/Chest: Effort normal and breath sounds normal. No respiratory distress. She has no decreased breath sounds. She has no wheezes. She has no rhonchi. She has no rales.  CTAB in all lung fields, no w/r/r, no hypoxia or increased WOB, speaking in full  sentences, SpO2 96-98% on RA. Appears comfortable in no respiratory distress  Abdominal: Soft. Normal appearance and bowel sounds are normal. She exhibits no distension. There is no tenderness. There is no rigidity, no rebound, no guarding, no CVA tenderness, no tenderness at McBurney's point and negative Murphy's sign.  Musculoskeletal:       Right hand: She exhibits decreased range of motion (due to pain), tenderness, bony tenderness and swelling. She exhibits normal two-point discrimination, normal capillary refill, no deformity and no laceration. Normal sensation noted. Normal strength noted.       Hands: R hand with mildly limited ROM of 4th-5th digits due to pain, with moderate TTP across 4th-5th MCP joints and into 5th digit, mild erythema and warmth, mild swelling most focally around this area, no crepitus or deformity, no wounds or abrasions, strength and sensation grossly intact, distal pulses intact, soft compartments.   Neurological: She is alert and oriented to person, place, and time. She has normal strength. No sensory deficit.  Skin: Skin is  warm, dry and intact. No rash noted.  Psychiatric: She has a normal mood and affect.  Nursing note and vitals reviewed.    ED Treatments / Results  Labs (all labs ordered are listed, but only abnormal results are displayed) Labs Reviewed - No data to display  EKG  EKG Interpretation  Date/Time:  Wednesday December 27 2017 12:14:22 EST Ventricular Rate:  76 PR Interval:    QRS Duration: 94 QT Interval:  363 QTC Calculation: 409 R Axis:   92 Text Interpretation:  Sinus rhythm Right axis deviation Confirmed by Tanna Furry [phone]) on 12/27/2017 12:47:45 PM       Radiology Dg Hand Complete Right  Result Date: 12/27/2017 CLINICAL DATA:  Right hand pain and swelling.  No known injury. EXAM: RIGHT HAND - COMPLETE 3+ VIEW COMPARISON:  None in PACs FINDINGS: The bones are subjectively osteopenic. 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 There is narrowing of the interphalangeal joints diffusely. There is mild narrowing of the first MCP joint. The carpometacarpal joint spaces are reasonably well-maintained. The carpal bones exhibit no significant abnormalities. There is diffuse soft tissue swelling. IMPRESSION: There is no acute bony abnormality of the right hand. Mild osteoarthritic joint space loss as described. Diffuse soft tissue swelling may reflect cellulitis or peripheral edema. Electronically Signed   By: David  Martinique M.D.   On: 12/27/2017 13:18    Procedures Procedures (including critical care time)  Medications Ordered in ED Medications - No data to display   Initial Impression / Assessment and Plan / ED Course  I have reviewed the triage vital signs and the nursing notes.  Pertinent labs & imaging results that were available during my care of the patient were reviewed by me and considered in my medical decision making (see chart for details).     67 y.o. female here with R hand pain and swelling x2 days. Hx of gout, states this feels less severe than that did. Based on our [facility], doesn't look like she's on gout meds, she's not sure if she is or not. Had fish a few days prior to this starting. No injuries or trauma, no skin wounds. On exam, R hand with moderate TTP most focally around the 4th-5th MCP joints and into the 5th finger, +erythema, slight warmth, +swelling mostly around these two fingers, skin without wounds or abrasions, NVI with soft compartments. Appears consistent with gout flare, doubt cellulitis or DVT/PE. Per nursing staff, MD at nursing home sent her here to r/o PE because she "seemed SOB" however pt does not appear to be having any difficulty breathing, and denies SOB, and has no s/sx of PE, doubt this as a source of her hand pain/swelling. EKG done in triage is nonischemic. Will xray to ensure no underlying bony involvement otherwise, pt declines wanting anything for pain, will  reassess shortly. Discussed case with my attending Dr. Jeneen Rinks who agrees with plan.   1:51 PM Xray showing mild OA joint space loss in multiple joints, and soft tissue swelling. Will treat as gout, which is clinically what it appears to be. Will send home with colchicine. Advised RICE, tylenol/ibuprofen use, and f/up with PCP in 3-5 days for recheck of symptoms. I explained the diagnosis and have given explicit precautions to return to the ER including for any other new or worsening symptoms. The patient understands and accepts the medical plan as it's been dictated and I have answered their questions. Discharge instructions concerning home care and prescriptions have been given.  The patient is STABLE and is discharged to home in good condition.    Final Clinical Impressions(s) / ED Diagnoses   Final diagnoses:  Right hand pain  Acute gout of right hand, unspecified cause    ED Discharge Orders        Ordered    colchicine 0.6 MG tablet     12/27/17 [address], [place], [place] 12/27/17 1351    Tanna Furry, MD 12/28/17 2209

## 2017-12-27 NOTE — Discharge Instructions (Signed)
Your symptoms appear consistent with gout attack in your hand. Ice and elevate hand throughout the day to help with pain and swelling, using ice pack for no more than 20 minutes every hour.  Use colchicine as directed for pain and gout attack. Use additional tylenol and ibuprofen as needed for additional pain relief. Avoid red meat, alcohol, and seafood. Follow up with your regular doctor in 3-5 days for recheck of symptoms and ongoing management of your gout. Return to the ER for changes or worsening symptoms.

## 2018-04-11 ENCOUNTER — Other Ambulatory Visit: Payer: Self-pay

## 2018-04-11 ENCOUNTER — Inpatient Hospital Stay (HOSPITAL_COMMUNITY)
Admission: EM | Admit: 2018-04-11 | Discharge: 2018-04-23 | DRG: 640 | Disposition: A | Discharge status: Nursing Home (Includes ICF) | Payer: Medicaid Other | Source: Skilled Nursing Facility | Attending: Internal Medicine | Admitting: Internal Medicine

## 2018-04-11 ENCOUNTER — Encounter (HOSPITAL_COMMUNITY): Payer: Self-pay

## 2018-04-11 DIAGNOSIS — E1122 Type 2 diabetes mellitus with diabetic chronic kidney disease: Secondary | ICD-10-CM | POA: Diagnosis present

## 2018-04-11 DIAGNOSIS — Z6841 Body Mass Index (BMI) 40.0 and over, adult: Secondary | ICD-10-CM

## 2018-04-11 DIAGNOSIS — E039 Hypothyroidism, unspecified: Secondary | ICD-10-CM | POA: Diagnosis present

## 2018-04-11 DIAGNOSIS — N2581 Secondary hyperparathyroidism of renal origin: Secondary | ICD-10-CM | POA: Diagnosis present

## 2018-04-11 DIAGNOSIS — E11649 Type 2 diabetes mellitus with hypoglycemia without coma: Secondary | ICD-10-CM | POA: Diagnosis present

## 2018-04-11 DIAGNOSIS — F70 Mild intellectual disabilities: Secondary | ICD-10-CM

## 2018-04-11 DIAGNOSIS — I10 Essential (primary) hypertension: Secondary | ICD-10-CM | POA: Diagnosis not present

## 2018-04-11 DIAGNOSIS — R0600 Dyspnea, unspecified: Secondary | ICD-10-CM

## 2018-04-11 DIAGNOSIS — L03115 Cellulitis of right lower limb: Secondary | ICD-10-CM | POA: Diagnosis present

## 2018-04-11 DIAGNOSIS — M109 Gout, unspecified: Secondary | ICD-10-CM | POA: Diagnosis present

## 2018-04-11 DIAGNOSIS — E875 Hyperkalemia: Secondary | ICD-10-CM | POA: Diagnosis not present

## 2018-04-11 DIAGNOSIS — Z79890 Hormone replacement therapy (postmenopausal): Secondary | ICD-10-CM

## 2018-04-11 DIAGNOSIS — Z7984 Long term (current) use of oral hypoglycemic drugs: Secondary | ICD-10-CM

## 2018-04-11 DIAGNOSIS — F17290 Nicotine dependence, other tobacco product, uncomplicated: Secondary | ICD-10-CM | POA: Diagnosis present

## 2018-04-11 DIAGNOSIS — R609 Edema, unspecified: Secondary | ICD-10-CM

## 2018-04-11 DIAGNOSIS — I130 Hypertensive heart and chronic kidney disease with heart failure and stage 1 through stage 4 chronic kidney disease, or unspecified chronic kidney disease: Secondary | ICD-10-CM | POA: Diagnosis present

## 2018-04-11 DIAGNOSIS — R0902 Hypoxemia: Secondary | ICD-10-CM | POA: Diagnosis present

## 2018-04-11 DIAGNOSIS — N183 Chronic kidney disease, stage 3 unspecified: Secondary | ICD-10-CM | POA: Diagnosis present

## 2018-04-11 DIAGNOSIS — E1129 Type 2 diabetes mellitus with other diabetic kidney complication: Secondary | ICD-10-CM | POA: Diagnosis present

## 2018-04-11 DIAGNOSIS — I5033 Acute on chronic diastolic (congestive) heart failure: Secondary | ICD-10-CM | POA: Diagnosis present

## 2018-04-11 DIAGNOSIS — E785 Hyperlipidemia, unspecified: Secondary | ICD-10-CM | POA: Diagnosis present

## 2018-04-11 DIAGNOSIS — T464X5A Adverse effect of angiotensin-converting-enzyme inhibitors, initial encounter: Secondary | ICD-10-CM | POA: Diagnosis present

## 2018-04-11 DIAGNOSIS — I129 Hypertensive chronic kidney disease with stage 1 through stage 4 chronic kidney disease, or unspecified chronic kidney disease: Secondary | ICD-10-CM | POA: Diagnosis present

## 2018-04-11 DIAGNOSIS — N184 Chronic kidney disease, stage 4 (severe): Secondary | ICD-10-CM | POA: Diagnosis present

## 2018-04-11 DIAGNOSIS — D631 Anemia in chronic kidney disease: Secondary | ICD-10-CM | POA: Diagnosis present

## 2018-04-11 DIAGNOSIS — R509 Fever, unspecified: Secondary | ICD-10-CM

## 2018-04-11 HISTORY — DX: Major depressive disorder, single episode, unspecified: F32.9

## 2018-04-11 HISTORY — DX: Anxiety disorder, unspecified: F41.9

## 2018-04-11 HISTORY — DX: Depression, unspecified: F32.A

## 2018-04-11 HISTORY — DX: Heart failure, unspecified: I50.9

## 2018-04-11 HISTORY — DX: Anemia, unspecified: D64.9

## 2018-04-11 HISTORY — DX: Other chronic pain: G89.29

## 2018-04-11 HISTORY — DX: Chronic kidney disease, stage 4 (severe): N18.4

## 2018-04-11 HISTORY — DX: Dorsalgia, unspecified: M54.9

## 2018-04-11 HISTORY — DX: Unspecified convulsions: R56.9

## 2018-04-11 HISTORY — DX: Unspecified osteoarthritis, unspecified site: M19.90

## 2018-04-11 HISTORY — DX: Type 2 diabetes mellitus without complications: E11.9

## 2018-04-11 LAB — COMPREHENSIVE METABOLIC PANEL
ALK PHOS: 65 U/L (ref 38–126)
ALT: 13 U/L — ABNORMAL LOW (ref 14–54)
AST: 26 U/L (ref 15–41)
Albumin: 2.9 g/dL — ABNORMAL LOW (ref 3.5–5.0)
Anion gap: 7 (ref 5–15)
BUN: 42 mg/dL — AB (ref 6–20)
CALCIUM: 8.6 mg/dL — AB (ref 8.9–10.3)
CHLORIDE: 104 mmol/L (ref 101–111)
CO2: 25 mmol/L (ref 22–32)
CREATININE: 2.1 mg/dL — AB (ref 0.44–1.00)
GFR calc Af Amer: 28 mL/min — ABNORMAL LOW (ref >60)
GFR, EST NON AFRICAN AMERICAN: 24 mL/min — AB (ref >60)
Glucose, Bld: 121 mg/dL — ABNORMAL HIGH (ref 65–99)
Potassium: 6.8 mmol/L (ref 3.5–5.1)
Sodium: 136 mmol/L (ref 135–145)
Total Bilirubin: 0.6 mg/dL (ref 0.3–1.2)
Total Protein: 6.7 g/dL (ref 6.5–8.1)

## 2018-04-11 LAB — CBC WITH DIFFERENTIAL/PLATELET
Abs Immature Granulocytes: 0 10*3/uL (ref 0.0–0.1)
BASOS PCT: 0 %
Basophils Absolute: 0 10*3/uL (ref 0.0–0.1)
EOS ABS: 0.1 10*3/uL (ref 0.0–0.7)
Eosinophils Relative: 3 %
HEMATOCRIT: 37.2 % (ref 36.0–46.0)
Hemoglobin: 10.8 g/dL — ABNORMAL LOW (ref 12.0–15.0)
Immature Granulocytes: 0 %
LYMPHS ABS: 1.3 10*3/uL (ref 0.7–4.0)
Lymphocytes Relative: 28 %
MCH: 28.6 pg (ref 26.0–34.0)
MCHC: 29 g/dL — AB (ref 30.0–36.0)
MCV: 98.4 fL (ref 78.0–100.0)
MONOS PCT: 20 %
Monocytes Absolute: 0.9 10*3/uL (ref 0.1–1.0)
Neutro Abs: 2.2 10*3/uL (ref 1.7–7.7)
Neutrophils Relative %: 49 %
Platelets: 193 10*3/uL (ref 150–400)
RBC: 3.78 MIL/uL — ABNORMAL LOW (ref 3.87–5.11)
RDW: 15.9 % — AB (ref 11.5–15.5)
WBC: 4.5 10*3/uL (ref 4.0–10.5)

## 2018-04-11 LAB — CBG MONITORING, ED
GLUCOSE-CAPILLARY: 85 mg/dL (ref 65–99)
GLUCOSE-CAPILLARY: 25 mg/dL — AB (ref 65–99)
GLUCOSE-CAPILLARY: 30 mg/dL — AB (ref 65–99)
Glucose-Capillary: 115 mg/dL — ABNORMAL HIGH (ref 65–99)
Glucose-Capillary: 160 mg/dL — ABNORMAL HIGH (ref 65–99)
Glucose-Capillary: 34 mg/dL — CL (ref 65–99)

## 2018-04-11 LAB — URINALYSIS, ROUTINE W REFLEX MICROSCOPIC
Bilirubin Urine: NEGATIVE
Glucose, UA: NEGATIVE mg/dL
HGB URINE DIPSTICK: NEGATIVE
Ketones, ur: NEGATIVE mg/dL
Nitrite: NEGATIVE
PROTEIN: 100 mg/dL — AB
Specific Gravity, Urine: 1.012 (ref 1.005–1.030)
pH: 6 (ref 5.0–8.0)

## 2018-04-11 LAB — NA AND K (SODIUM & POTASSIUM), RAND UR
Potassium Urine: 24 mmol/L
Sodium, Ur: 79 mmol/L

## 2018-04-11 MED ORDER — INSULIN ASPART 100 UNIT/ML ~~LOC~~ SOLN: 0.0000 [IU] | Freq: Three times a day (TID) | SUBCUTANEOUS | Status: DC

## 2018-04-11 MED ORDER — SIMVASTATIN 20 MG PO TABS
20.0000 mg | ORAL_TABLET | Freq: Every day | ORAL | Status: DC
  Administered 2018-04-12 – 2018-04-23 (×12): 20 mg via ORAL
  Filled 2018-04-11 (×13): qty 1

## 2018-04-11 MED ORDER — DEXTROSE-NACL 5-0.9 % IV SOLN
INTRAVENOUS | Status: DC
  Administered 2018-04-11: via INTRAVENOUS

## 2018-04-11 MED ORDER — DEXTROSE 50 % IV SOLN: 1.0000 | Freq: Once | INTRAVENOUS | Status: DC

## 2018-04-11 MED ORDER — ACETAMINOPHEN 325 MG PO TABS
650.0000 mg | ORAL_TABLET | Freq: Four times a day (QID) | ORAL | Status: DC | PRN
  Administered 2018-04-14 – 2018-04-18 (×4): 650 mg via ORAL
  Filled 2018-04-11 (×4): qty 2

## 2018-04-11 MED ORDER — PATIROMER SORBITEX CALCIUM 8.4 G PO PACK
8.4000 g | PACK | Freq: Every day | ORAL | Status: DC
  Administered 2018-04-11: 8.4 g via ORAL
  Filled 2018-04-11 (×2): qty 1

## 2018-04-11 MED ORDER — DEXTROSE 50 % IV SOLN
INTRAVENOUS | Status: AC
  Administered 2018-04-11: 50 mL
  Filled 2018-04-11: qty 50

## 2018-04-11 MED ORDER — ONDANSETRON HCL 4 MG PO TABS: 4.0000 mg | ORAL_TABLET | Freq: Four times a day (QID) | ORAL | Status: DC | PRN

## 2018-04-11 MED ORDER — SODIUM CHLORIDE 0.9 % IV SOLN
Freq: Once | INTRAVENOUS | Status: AC
  Administered 2018-04-11: 20:00:00 via INTRAVENOUS

## 2018-04-11 MED ORDER — LEVOTHYROXINE SODIUM 112 MCG PO TABS
112.0000 ug | ORAL_TABLET | Freq: Every day | ORAL | Status: DC
  Administered 2018-04-12 – 2018-04-23 (×12): 112 ug via ORAL
  Filled 2018-04-11 (×13): qty 1

## 2018-04-11 MED ORDER — INSULIN ASPART 100 UNIT/ML IV SOLN
10.0000 [IU] | Freq: Once | INTRAVENOUS | Status: AC
  Administered 2018-04-11: 10 [IU] via INTRAVENOUS
  Filled 2018-04-11: qty 0.1

## 2018-04-11 MED ORDER — METOPROLOL TARTRATE 50 MG PO TABS
50.0000 mg | ORAL_TABLET | Freq: Two times a day (BID) | ORAL | Status: DC
  Administered 2018-04-12 – 2018-04-23 (×24): 50 mg via ORAL
  Filled 2018-04-11 (×24): qty 1

## 2018-04-11 MED ORDER — ACETAMINOPHEN 650 MG RE SUPP: 650.0000 mg | Freq: Four times a day (QID) | RECTAL | Status: DC | PRN

## 2018-04-11 MED ORDER — SODIUM BICARBONATE 8.4 % IV SOLN
50.0000 meq | Freq: Once | INTRAVENOUS | Status: AC
  Administered 2018-04-11: 50 meq via INTRAVENOUS
  Filled 2018-04-11: qty 50

## 2018-04-11 MED ORDER — ONDANSETRON HCL 4 MG/2ML IJ SOLN: 4.0000 mg | Freq: Four times a day (QID) | INTRAMUSCULAR | Status: DC | PRN

## 2018-04-11 MED ORDER — DEXTROSE 50 % IV SOLN
1.0000 | Freq: Once | INTRAVENOUS | Status: AC
  Administered 2018-04-11: 50 mL via INTRAVENOUS
  Filled 2018-04-11: qty 50

## 2018-04-11 MED ORDER — HYDRALAZINE HCL 50 MG PO TABS
50.0000 mg | ORAL_TABLET | Freq: Two times a day (BID) | ORAL | Status: DC
  Administered 2018-04-12 – 2018-04-16 (×11): 50 mg via ORAL
  Filled 2018-04-11 (×12): qty 1

## 2018-04-11 MED ORDER — HEPARIN SODIUM (PORCINE) 5000 UNIT/ML IJ SOLN
5000.0000 [IU] | Freq: Three times a day (TID) | INTRAMUSCULAR | Status: DC
  Administered 2018-04-12 – 2018-04-23 (×36): 5000 [IU] via SUBCUTANEOUS
  Filled 2018-04-11 (×30): qty 1

## 2018-04-11 MED ORDER — ALLOPURINOL 100 MG PO TABS
100.0000 mg | ORAL_TABLET | Freq: Every day | ORAL | Status: DC
  Administered 2018-04-12 – 2018-04-23 (×12): 100 mg via ORAL
  Filled 2018-04-11 (×12): qty 1

## 2018-04-11 NOTE — ED Notes (Signed)
CBG of 25 reported by EMT.  Patient still alert and oriented x4.  Patient given 8oz and tolerated fine.    Dr. Hal Hope was notified and called back immediately.  Said to give patient some food and recheck CBG.

## 2018-04-11 NOTE — ED Notes (Signed)
Urine culture sent down with urinalysis.  

## 2018-04-11 NOTE — ED Provider Notes (Signed)
[facility] EMERGENCY DEPARTMENT Provider Note   CSN: [id number] Arrival date & time: 04/11/18  1806     History   Chief Complaint Chief Complaint  Patient presents with  . Abnormal Lab    HPI Kayla Hunt is a 67 y.o. female.  67 year old female with prior history of chronic edema, chronic kidney disease, hyperlipidemia, hypertension, and hypothyroidism who presents for evaluation of elevated potassium.  Patient reports that she was being evaluated for "some swelling of her legs" when labs were obtained today.  The outpatient labs showed a potassium of 6.8.  She denies any other complaint or symptom.  She denies a prior history of hypokalemia.  Her creatinine appears to be at its baseline - near 2.0.   The history is provided by the patient and medical records.  Abnormal Lab  Time since result:  Hyperkalemia - 6.8 Patient referred by:  ED personnel Resulting agency:  External Result type: chemistry     Past Medical History:  Diagnosis Date  . Chronic edema    bilat LE's  . CKD (chronic kidney disease)   . Diabetes mellitus   . Gout   . Hyperlipidemia   . Hypertension   . Hypothyroidism     Patient Active Problem List   Diagnosis Date Noted  . Type II diabetes mellitus with renal manifestations ([place]) 03/11/2015  . Osteoarthritis of both knees 03/11/2015  . Dyslipidemia 10/02/2007  . MILD MENTAL RETARDATION 10/02/2007  . DEAFNESS, UNILATERAL 10/02/2007  . SECONDARY HYPERPARATHYROIDISM 01/04/2007  . CHRONIC KIDNEY DISEASE STAGE III (MODERATE) 11/27/2004  . GOUT, UNSPECIFIED 01/06/2003  . Hypothyroidism 05/28/1998  . ESSENTIAL HYPERTENSION, BENIGN 04/15/1998    No past surgical history on file.   OB History   None      Home Medications    Prior to Admission medications   Medication Sig Start Date End Date Taking? Authorizing Provider  allopurinol (ZYLOPRIM) 100 MG tablet Take 100 mg by mouth daily.   Yes [provider]    furosemide (LASIX) 20 MG tablet Take 20 mg by mouth daily.   Yes [provider]  glipiZIDE (GLUCOTROL) 5 MG tablet Take 5 mg by mouth 2 (two) times daily before a meal.   Yes [provider]  guaiFENesin-dextromethorphan (ROBITUSSIN DM) 100-10 MG/5ML syrup Take 10 mLs by mouth every 4 (four) hours as needed for cough.   Yes [provider]  hydrALAZINE (APRESOLINE) 50 MG tablet Take 50 mg by mouth 2 (two) times daily.    Yes [provider]  levothyroxine (SYNTHROID, LEVOTHROID) 112 MCG tablet Take 112 mcg by mouth daily before breakfast.   Yes [provider]  lisinopril (PRINIVIL,ZESTRIL) 40 MG tablet Take 80 mg by mouth daily.   Yes [provider]  loratadine (CLARITIN) 10 MG tablet Take 10 mg by mouth daily as needed for allergies.   Yes [provider]  metoprolol tartrate (LOPRESSOR) 50 MG tablet Take 50 mg by mouth 2 (two) times daily.   Yes [provider]  simvastatin (ZOCOR) 20 MG tablet Take 20 mg by mouth daily.   Yes [provider]  colchicine 0.6 MG tablet Take 2 tablets once, then one hour later take one more tablet. Then take one tablet every day for 5 days. Patient not taking: Reported on 04/11/2018 12/27/17   Street, [place], PA-C    Family History No family history on file.  Social History Social History   Tobacco Use  . Smoking status: Unknown  If Ever Smoked  . Smokeless tobacco: Current User    Types: Snuff  Substance Use Topics  . Alcohol use: No    Frequency: Never  . Drug use: No     Allergies   Patient has no known allergies.   Review of Systems Review of Systems  All other systems reviewed and are negative.    Physical Exam Updated Vital Signs BP 135/72   Pulse 65   Temp 98.9 F (37.2 C) (Oral)   Resp (!) 31   Ht 5\' 4"  (1.626 m)   Wt 136.1 kg (300 lb)   SpO2 90%   BMI 51.49 kg/m   Physical Exam  Constitutional: She is oriented to person, place, and time.  She appears well-developed and well-nourished. No distress.  HENT:  Head: Normocephalic and atraumatic.  Mouth/Throat: Oropharynx is clear and moist.  Eyes: Pupils are equal, round, and reactive to light. Conjunctivae and EOM are normal.  Neck: Normal range of motion. Neck supple.  Cardiovascular: Normal rate, regular rhythm and normal heart sounds.  Pulmonary/Chest: Effort normal and breath sounds normal. No respiratory distress.  Abdominal: Soft. She exhibits no distension. There is no tenderness.  Musculoskeletal: Normal range of motion. She exhibits no edema or deformity.  Brawny edema of both lower extremities noted  Neurological: She is alert and oriented to person, place, and time.  Skin: Skin is warm and dry.  Psychiatric: She has a normal mood and affect.  Nursing note and vitals reviewed.    ED Treatments / Results  Labs (all labs ordered are listed, but only abnormal results are displayed) Labs Reviewed  COMPREHENSIVE METABOLIC PANEL - Abnormal; Notable for the following components:      Result Value   Potassium 6.8 (*)    Glucose, Bld 121 (*)    BUN 42 (*)    Creatinine, Ser 2.10 (*)    Calcium 8.6 (*)    Albumin 2.9 (*)    ALT 13 (*)    GFR calc non Af Amer 24 (*)    GFR calc Af Amer 28 (*)    All other components within normal limits  CBC WITH DIFFERENTIAL/PLATELET - Abnormal; Notable for the following components:   RBC 3.78 (*)    Hemoglobin 10.8 (*)    MCHC 29.0 (*)    RDW 15.9 (*)    All other components within normal limits  CBG MONITORING, ED - Abnormal; Notable for the following components:   Glucose-Capillary 115 (*)    All other components within normal limits  NA AND K (SODIUM & POTASSIUM), RAND UR  URINALYSIS, ROUTINE W REFLEX MICROSCOPIC  CBG MONITORING, ED    EKG EKG Interpretation  Date/Time:  Wednesday April 11 2018 18:09:57 EDT Ventricular Rate:  71 PR Interval:    QRS Duration: 94 QT Interval:  377 QTC Calculation: 410 R  Axis:   94 Text Interpretation:  Sinus rhythm Right axis deviation Borderline T abnormalities, inferior leads Confirmed by Dene Gentry [phone]) on 04/11/2018 6:54:38 PM   Radiology No results found.  Procedures Procedures (including critical care time)  Medications Ordered in ED Medications  patiromer [facility]) packet 8.4 g (8.4 g Oral Given 04/11/18 2020)  insulin aspart (novoLOG) injection 10 Units (10 Units Intravenous Given 04/11/18 2020)  dextrose 50 % solution 50 mL (50 mLs Intravenous Given 04/11/18 2020)  sodium bicarbonate injection 50 mEq (50 mEq Intravenous Given 04/11/18 2020)  0.9 %  sodium chloride infusion ( Intravenous New Bag/Given 04/11/18 2020)  Initial Impression / Assessment and Plan / ED Course  I have reviewed the triage vital signs and the nursing notes.  Pertinent labs & imaging results that were available during my care of the patient were reviewed by me and considered in my medical decision making (see chart for details).     MDM  Screen complete  Patient is presenting for reported hyperkalemia.  Patient does not appear to have any significant effects from same.  EKG is without evidence of hyperkalemic changes.  Patient otherwise is without symptoms.  Her kidney function appears to be at baseline.  Hyperkalemia treated.  Case discussed with Dr. Moshe Cipro, nephrology, who agrees with treatment plan.  Case discussed with the medicine service for likely admission for observation.    Final Clinical Impressions(s) / ED Diagnoses   Final diagnoses:  Hyperkalemia    ED Discharge Orders    None       Valarie Merino, MD 04/11/18 2148

## 2018-04-11 NOTE — ED Notes (Signed)
Per Dr. Hal Hope, discontinue the NaCL infusion. Same completed.

## 2018-04-11 NOTE — ED Triage Notes (Signed)
Pt BIBA for abnromal labs (6.8) K+ ; pt denies being on dialysis ; Upon ems arrival pt's BS  Was 63 , ems gave oral glucose and BS went up to 96 ; PT denies any SOB or CP

## 2018-04-11 NOTE — ED Notes (Signed)
Hal Hope, MD notified of CBG. Mitzi Hansen, RN to give OJ. Will continue to monitor.

## 2018-04-11 NOTE — H&P (Addendum)
History and Physical    Kayla Hunt KGU:[id number] DOB: 02/15/1951 DOA: 04/11/2018  PCP: Reymundo Poll, MD  Patient coming from [facility] facility.  Chief Complaint: Elevated potassium levels.  HPI: Kayla Hunt is a 67 y.o. female with history of diabetes mellitus type 2, chronic kidney disease stage IV, hypertension, hypothyroidism, gout was referred from the skilled nursing facility after patient potassium was found to be around 6.8.  Patient states she was noticed to have lower extremity edema and had lab works done which showed an elevated potassium and was referred to the ER patient denies any chest pain shortness of breath nausea vomiting or diarrhea.  ED Course: In the ER patient's labs again confirmed potassium was 6.8.  EKG does not show any changes for potassium.  On exam bilateral lower extremity edema found.  More on the left side.  ER physician discussed with on-call nephrologist who advised patient to be placed on patiromer for hyperkalemia along with patient was given IV insulin D50 and sodium bicarb.  Patient admitted for further observation.  Review of Systems: As per HPI, rest all negative.   Past Medical History:  Diagnosis Date  . Chronic edema    bilat LE's  . CKD (chronic kidney disease)   . Diabetes mellitus   . Gout   . Hyperlipidemia   . Hypertension   . Hypothyroidism     History reviewed. No pertinent surgical history.   has an unknown smoking status. Her smokeless tobacco use includes snuff. She reports that she does not drink alcohol or use drugs.  No Known Allergies  Family History  Family history unknown: Yes    Prior to Admission medications   Medication Sig Start Date End Date Taking? Authorizing Provider  allopurinol (ZYLOPRIM) 100 MG tablet Take 100 mg by mouth daily.   Yes [provider]  furosemide (LASIX) 20 MG tablet Take 20 mg by mouth daily.   Yes [provider]  glipiZIDE (GLUCOTROL) 5 MG tablet Take 5  mg by mouth 2 (two) times daily before a meal.   Yes [provider]  guaiFENesin-dextromethorphan (ROBITUSSIN DM) 100-10 MG/5ML syrup Take 10 mLs by mouth every 4 (four) hours as needed for cough.   Yes [provider]  hydrALAZINE (APRESOLINE) 50 MG tablet Take 50 mg by mouth 2 (two) times daily.    Yes [provider]  levothyroxine (SYNTHROID, LEVOTHROID) 112 MCG tablet Take 112 mcg by mouth daily before breakfast.   Yes [provider]  lisinopril (PRINIVIL,ZESTRIL) 40 MG tablet Take 80 mg by mouth daily.   Yes [provider]  loratadine (CLARITIN) 10 MG tablet Take 10 mg by mouth daily as needed for allergies.   Yes [provider]  metoprolol tartrate (LOPRESSOR) 50 MG tablet Take 50 mg by mouth 2 (two) times daily.   Yes [provider]  simvastatin (ZOCOR) 20 MG tablet Take 20 mg by mouth daily.   Yes [provider]  colchicine 0.6 MG tablet Take 2 tablets once, then one hour later take one more tablet. Then take one tablet every day for 5 days. Patient not taking: Reported on 04/11/2018 12/27/17   [address], [place], [place]    Physical Exam: Vitals:   04/11/18 2000 04/11/18 2015 04/11/18 2030 04/11/18 2045  BP: 130/65 137/67 (!) 146/65 (!) 129/58  Pulse: 65 63 67 67  Resp: 19 20 (!) 23 (!) 27  Temp:      TempSrc:      SpO2: Marland Kitchen)  88% 90% (!) 89% 96%  Weight:      Height:          Constitutional: Moderately built and nourished. Vitals:   04/11/18 2000 04/11/18 2015 04/11/18 2030 04/11/18 2045  BP: 130/65 137/67 (!) 146/65 (!) 129/58  Pulse: 65 63 67 67  Resp: 19 20 (!) 23 (!) 27  Temp:      TempSrc:      SpO2: (!) 88% 90% (!) 89% 96%  Weight:      Height:       Eyes: Anicteric no pallor. ENMT: No discharge from the ears eyes nose or mouth. Neck: No mass felt.  No neck rigidity. Respiratory: No rhonchi or crepitations. Cardiovascular: S1-S2 heard no murmurs appreciated. Abdomen: Soft nontender bowel  sounds present.   Musculoskeletal: Mild edema with both lower extremity more than the left side. Skin: No rash. Neurologic: Alert awake oriented to time place and person.  Moves all extremities. Psychiatric: Appears normal.  Normal affect.   Labs on Admission: I have personally reviewed following labs and imaging studies  CBC: Recent Labs  Lab 04/11/18 1827  WBC 4.5  NEUTROABS 2.2  HGB 10.8*  HCT 37.2  MCV 98.4  PLT 664   Basic Metabolic Panel: Recent Labs  Lab 04/11/18 1827  NA 136  K 6.8*  CL 104  CO2 25  GLUCOSE 121*  BUN 42*  CREATININE 2.10*  CALCIUM 8.6*   GFR: Estimated Creatinine Clearance: 37.3 mL/min (A) (by C-G formula based on SCr of 2.1 mg/dL (H)). Liver Function Tests: Recent Labs  Lab 04/11/18 1827  AST 26  ALT 13*  ALKPHOS 65  BILITOT 0.6  PROT 6.7  ALBUMIN 2.9*   No results for input(s): LIPASE, AMYLASE in the last 168 hours. No results for input(s): AMMONIA in the last 168 hours. Coagulation Profile: No results for input(s): INR, PROTIME in the last 168 hours. Cardiac Enzymes: No results for input(s): CKTOTAL, CKMB, CKMBINDEX, TROPONINI in the last 168 hours. BNP (last 3 results) No results for input(s): PROBNP in the last 8760 hours. HbA1C: No results for input(s): HGBA1C in the last 72 hours. CBG: Recent Labs  Lab 04/11/18 1809 04/11/18 1947  GLUCAP 115* 85   Lipid Profile: No results for input(s): CHOL, HDL, LDLCALC, TRIG, CHOLHDL, LDLDIRECT in the last 72 hours. Thyroid Function Tests: No results for input(s): TSH, T4TOTAL, FREET4, T3FREE, THYROIDAB in the last 72 hours. Anemia Panel: No results for input(s): VITAMINB12, FOLATE, FERRITIN, TIBC, IRON, RETICCTPCT in the last 72 hours. Urine analysis:    Component Value Date/Time   COLORURINE YELLOW 04/11/2018 1955   APPEARANCEUR HAZY (A) 04/11/2018 1955   LABSPEC 1.012 04/11/2018 1955   PHURINE 6.0 04/11/2018 1955   GLUCOSEU NEGATIVE 04/11/2018 1955   HGBUR NEGATIVE  04/11/2018 1955   BILIRUBINUR NEGATIVE 04/11/2018 1955   KETONESUR NEGATIVE 04/11/2018 1955   PROTEINUR 100 (A) 04/11/2018 1955   UROBILINOGEN 0.2 03/07/2015 0923   NITRITE NEGATIVE 04/11/2018 1955   LEUKOCYTESUR TRACE (A) 04/11/2018 1955   Sepsis Labs: @LABRCNTIP (procalcitonin:4,lacticidven:4) )No results found for this or any previous visit (from the past 240 hour(s)).   Radiological Exams on Admission: No results found.  EKG: Independently reviewed.  Normal sinus rhythm.  Assessment/Plan Principal Problem:   Hyperkalemia Active Problems:   Hypothyroidism   Gout, unspecified   MILD MENTAL RETARDATION   Essential hypertension, benign   CHRONIC KIDNEY DISEASE STAGE III (MODERATE)   Secondary renal hyperparathyroidism (HCC)   Type II diabetes mellitus with  renal manifestations ([place])    1. Hyperkalemia with history of chronic kidney disease stage IV -likely secondary to patient being on lisinopril with chronic kidney disease.  Will hold lisinopril will keep patient on renal diet.  Patient was given patiromer which will be continued.  Patient also was given D50 and IV insulin and bicarb.  Follow metabolic panel.  Patient is on Lasix which should be continued. 2. Diabetes mellitus type 2 with hypoglycemia -shortly after admission patient became hypoglycemic.  Will hold patient's oral hypoglycemics.  Patient did receive insulin in the ER for hyperkalemia.  Check hemoglobin A1c closely follow CBGs patient was started on D5. 3. Hypertension -due to progressive renal disease and hyperkalemia lisinopril discontinued.  Patient is on metoprolol hydralazine and PRN IV hydralazine. 4. Bilateral lower extremity edema -check Dopplers and chest x-ray.  Continue Lasix. 5. History of gout presently no acute pending. 6. Hypothyroidism on Synthroid. 7. Anemia likely from chronic kidney disease.  Follow CBC. 8. History of mental retardation per the chart.   DVT prophylaxis: Heparin. Code Status:  Full code. Family Communication: No family at the bedside. Disposition Plan: Back to nursing facility. Consults called: ER physician discussed with nephrologist. Admission status: Observation.   Rise Patience MD [facility] Pager [phone].  If 7PM-7AM, please contact night-coverage [email or web address] Password [facility]  04/11/2018, 9:14 PM

## 2018-04-12 ENCOUNTER — Observation Stay (HOSPITAL_COMMUNITY): Payer: Medicaid Other

## 2018-04-12 ENCOUNTER — Observation Stay (HOSPITAL_BASED_OUTPATIENT_CLINIC_OR_DEPARTMENT_OTHER): Payer: Medicaid Other

## 2018-04-12 ENCOUNTER — Encounter (HOSPITAL_COMMUNITY): Payer: Self-pay | Admitting: General Practice

## 2018-04-12 DIAGNOSIS — J810 Acute pulmonary edema: Secondary | ICD-10-CM | POA: Diagnosis not present

## 2018-04-12 DIAGNOSIS — E875 Hyperkalemia: Secondary | ICD-10-CM | POA: Diagnosis not present

## 2018-04-12 DIAGNOSIS — N183 Chronic kidney disease, stage 3 (moderate): Secondary | ICD-10-CM | POA: Diagnosis not present

## 2018-04-12 DIAGNOSIS — R609 Edema, unspecified: Secondary | ICD-10-CM

## 2018-04-12 DIAGNOSIS — L03115 Cellulitis of right lower limb: Secondary | ICD-10-CM | POA: Diagnosis not present

## 2018-04-12 DIAGNOSIS — I10 Essential (primary) hypertension: Secondary | ICD-10-CM | POA: Diagnosis not present

## 2018-04-12 DIAGNOSIS — E039 Hypothyroidism, unspecified: Secondary | ICD-10-CM | POA: Diagnosis not present

## 2018-04-12 LAB — BASIC METABOLIC PANEL
Anion gap: 8 (ref 5–15)
BUN: 32 mg/dL — ABNORMAL HIGH (ref 6–20)
CO2: 28 mmol/L (ref 22–32)
CREATININE: 1.69 mg/dL — AB (ref 0.44–1.00)
Calcium: 8.9 mg/dL (ref 8.9–10.3)
Chloride: 101 mmol/L (ref 101–111)
GFR, EST AFRICAN AMERICAN: 36 mL/min — AB (ref >60)
GFR, EST NON AFRICAN AMERICAN: 31 mL/min — AB (ref >60)
Glucose, Bld: 127 mg/dL — ABNORMAL HIGH (ref 65–99)
Potassium: 6.3 mmol/L (ref 3.5–5.1)
SODIUM: 137 mmol/L (ref 135–145)

## 2018-04-12 LAB — CBC
HCT: 38.1 % (ref 36.0–46.0)
Hemoglobin: 11 g/dL — ABNORMAL LOW (ref 12.0–15.0)
MCH: 28.4 pg (ref 26.0–34.0)
MCHC: 28.9 g/dL — ABNORMAL LOW (ref 30.0–36.0)
MCV: 98.4 fL (ref 78.0–100.0)
PLATELETS: 212 10*3/uL (ref 150–400)
RBC: 3.87 MIL/uL (ref 3.87–5.11)
RDW: 15.7 % — AB (ref 11.5–15.5)
WBC: 4.1 10*3/uL (ref 4.0–10.5)

## 2018-04-12 LAB — GLUCOSE, CAPILLARY
Glucose-Capillary: 158 mg/dL — ABNORMAL HIGH (ref 65–99)
Glucose-Capillary: 166 mg/dL — ABNORMAL HIGH (ref 65–99)

## 2018-04-12 LAB — CBG MONITORING, ED
GLUCOSE-CAPILLARY: 102 mg/dL — AB (ref 65–99)
GLUCOSE-CAPILLARY: 134 mg/dL — AB (ref 65–99)
GLUCOSE-CAPILLARY: 153 mg/dL — AB (ref 65–99)
Glucose-Capillary: 170 mg/dL — ABNORMAL HIGH (ref 65–99)
Glucose-Capillary: 145 mg/dL — ABNORMAL HIGH (ref 65–99)

## 2018-04-12 LAB — HEMOGLOBIN A1C
Hgb A1c MFr Bld: 5.9 % — ABNORMAL HIGH (ref 4.8–5.6)
Mean Plasma Glucose: 122.63 mg/dL

## 2018-04-12 LAB — MRSA PCR SCREENING: MRSA by PCR: NEGATIVE

## 2018-04-12 MED ORDER — FUROSEMIDE 40 MG PO TABS
20.0000 mg | ORAL_TABLET | Freq: Once | ORAL | Status: AC
  Administered 2018-04-12: 20 mg via ORAL
  Filled 2018-04-12: qty 1

## 2018-04-12 MED ORDER — FUROSEMIDE 40 MG PO TABS
20.0000 mg | ORAL_TABLET | Freq: Every day | ORAL | Status: DC
  Administered 2018-04-12 – 2018-04-13 (×2): 20 mg via ORAL
  Filled 2018-04-12 (×2): qty 1

## 2018-04-12 MED ORDER — SODIUM POLYSTYRENE SULFONATE 15 GM/60ML PO SUSP
30.0000 g | Freq: Once | ORAL | Status: AC
  Administered 2018-04-12: 30 g via ORAL
  Filled 2018-04-12: qty 120

## 2018-04-12 MED ORDER — FUROSEMIDE 10 MG/ML IJ SOLN
20.0000 mg | Freq: Once | INTRAMUSCULAR | Status: DC
  Filled 2018-04-12: qty 2

## 2018-04-12 NOTE — ED Notes (Signed)
Patient transported for vascular study

## 2018-04-12 NOTE — ED Notes (Signed)
  Patient was rolling around in bed and removed all monitoring equipment.  Patient also removed her PIV and purewick.

## 2018-04-12 NOTE — Progress Notes (Signed)
[facility]  PROGRESS NOTE  Kayla Hunt ALP:[id number] DOB: 1951/05/31 DOA: 04/11/2018 PCP: Reymundo Poll, MD   Brief HPI:   67 y.o. female with history of diabetes mellitus type 2, chronic kidney disease stage IV, hypertension, hypothyroidism, gout was referred from the skilled nursing facility after patient potassium was found to be around 6.8.  Patient states she was noticed to have lower extremity edema and had lab works done which showed an elevated potassium and was referred to the ER patient denies any chest pain shortness of breath nausea vomiting or diarrhea.    Subjective   Patient seen and examined, denies chest pain or shortness of breath.   Assessment/Plan:     1. Hyperkalemia, with history of CKD stage IV-likely patient being on lisinopril with underlying CKD.  Lisinopril is currently on hold.  Patient has been started on patiromer also was given D50 and IV insulin with bicarb.  Will repeat BMP today.  Continue Lasix.   2. Bilateral lower extremity edema -venous duplex of lower extremities is negative for DVT.  Continue p.o. Lasix.   3. Hypertension-lisinopril is currently on hold.  Continue metoprolol, hydralazine 4. Diabetes mellitus-patient had an episode of hypoglycemia in the ED, oral hypoglycemics currently on hold.  Continue sliding scale insulin with NovoLog. 5. Hypothyroidism-continue Synthroid 6. Anemia likely from underlying CKD-follow CBC in a.m.     DVT prophylaxis: Heparin  Code Status: Full code  Family Communication: No family at bedside  Disposition Plan: likely back to skilled facility in a.m.   Consultants:  None  Procedures:  None   Antibiotics:   Anti-infectives (From admission, onward)   None       Objective   Vitals:   04/12/18 0120 04/12/18 0122 04/12/18 0400 04/12/18 0500  BP: (!) 137/53 (!) 137/53 (!) 94/44 101/67  Pulse:  71 60 (!) 108  Resp:  15 (!) 23 (!) 32  Temp:      TempSrc:      SpO2:  93% 95% 95%   Weight:      Height:        Intake/Output Summary (Last 24 hours) at 04/12/2018 1443 Last data filed at 04/12/2018 1330 Gross per 24 hour  Intake 1000 ml  Output -  Net 1000 ml   Filed Weights   04/11/18 1810  Weight: 136.1 kg (300 lb)     Physical Examination:    General: Appears in no acute distress   Cardiovascular: S1-S2, regular  Respiratory: Clear to auscultation bilaterally  Abdomen: Soft, nontender, no organomegaly  Extremities: Bilateral 1+ pitting edema of the lower extremities  Neurologic: Alert oriented x3     Data Reviewed: I have personally reviewed following labs and imaging studies  CBG: Recent Labs  Lab 04/12/18 0035 04/12/18 0127 04/12/18 0238 04/12/18 0618 04/12/18 0828  GLUCAP 153* 170* 134* 102* 145*    CBC: Recent Labs  Lab 04/11/18 1827 04/12/18 1337  WBC 4.5 4.1  NEUTROABS 2.2  --   HGB 10.8* 11.0*  HCT 37.2 38.1  MCV 98.4 98.4  PLT 193 353    Basic Metabolic Panel: Recent Labs  Lab 04/11/18 1827  NA 136  K 6.8*  CL 104  CO2 25  GLUCOSE 121*  BUN 42*  CREATININE 2.10*  CALCIUM 8.6*    No results found for this or any previous visit (from the past 240 hour(s)).   Liver Function Tests: Recent Labs  Lab 04/11/18 1827  AST 26  ALT 13*  ALKPHOS 65  BILITOT 0.6  PROT 6.7  ALBUMIN 2.9*   No results for input(s): LIPASE, AMYLASE in the last 168 hours. No results for input(s): AMMONIA in the last 168 hours.  Cardiac Enzymes: No results for input(s): CKTOTAL, CKMB, CKMBINDEX, TROPONINI in the last 168 hours. BNP (last 3 results) No results for input(s): BNP in the last 8760 hours.  ProBNP (last 3 results) No results for input(s): PROBNP in the last 8760 hours.    Studies: Dg Chest Port 1 View  Result Date: 04/12/2018 CLINICAL DATA:  Edema EXAM: PORTABLE CHEST 1 VIEW COMPARISON:  05/06/2011 FINDINGS: Cardiac enlargement with pulmonary vascular congestion. Probable interstitial edema. Negative for  effusion. Lungs are well aerated. IMPRESSION: Cardiac enlargement with vascular congestion and mild interstitial edema. Electronically Signed   By: Franchot Gallo M.D.   On: 04/12/2018 07:27    Scheduled Meds: . allopurinol  100 mg Oral Daily  . dextrose  1 ampule Intravenous Once  . furosemide  20 mg Oral Daily  . heparin  5,000 Units Subcutaneous Q8H  . hydrALAZINE  50 mg Oral BID  . levothyroxine  112 mcg Oral QAC breakfast  . metoprolol tartrate  50 mg Oral BID  . patiromer  8.4 g Oral Daily  . simvastatin  20 mg Oral Daily      Time spent: 25 min  [facility] Pager [phone]. If 7PM-7AM, please contact night-coverage at [email or web address], Office  [phone]  password [email or web address]  04/12/2018, 2:43 PM  LOS: 0 days

## 2018-04-12 NOTE — ED Notes (Signed)
  Patient has had to be asked several times to keep her leads and BP cuff on but will remove them once we leave the room.

## 2018-04-12 NOTE — Progress Notes (Signed)
Attempted to take report, nurse busy will call in 30min.

## 2018-04-12 NOTE — Progress Notes (Signed)
Attempted to take report.

## 2018-04-12 NOTE — ED Notes (Addendum)
Dr. Darrick Meigs went to see pt. He is aware that pt is requesting to be d/c home.  He states that pt lives in a nsg home and will need [facility] transport but will not transport her if she leaves AMA. Dr. Darrick Meigs cannot d/c pt to home d/t elevated K. He also reported pt has hx of MR and cannot make decisions on her own.  Pt is aware that she cannot go home at this time.

## 2018-04-12 NOTE — ED Notes (Signed)
  Patient is not tolerating being hooked up to monitor and continually removes her leads and BP cuff.  Will notify MD

## 2018-04-12 NOTE — ED Notes (Signed)
Phlebotomy at bedside.

## 2018-04-12 NOTE — ED Notes (Signed)
Breakfast tray ordered 

## 2018-04-12 NOTE — ED Notes (Signed)
Pt ambulated too restroom and around room with no difficulties.

## 2018-04-12 NOTE — ED Notes (Addendum)
Pt received breakfast tray 

## 2018-04-12 NOTE — Progress Notes (Signed)
VASCULAR LAB PRELIMINARY  PRELIMINARY  PRELIMINARY  PRELIMINARY  Bilateral lower extremity venous duplex completed.    Preliminary report:  There is no obvious evidence of DVT or SVT noted in the visualized veins of the bilateral lower extremities.   Zamzam Whinery, RVT 04/12/2018, 9:04 AM

## 2018-04-13 ENCOUNTER — Observation Stay (HOSPITAL_COMMUNITY): Payer: Medicaid Other

## 2018-04-13 DIAGNOSIS — D631 Anemia in chronic kidney disease: Secondary | ICD-10-CM | POA: Diagnosis present

## 2018-04-13 DIAGNOSIS — L03115 Cellulitis of right lower limb: Secondary | ICD-10-CM | POA: Diagnosis present

## 2018-04-13 DIAGNOSIS — J810 Acute pulmonary edema: Secondary | ICD-10-CM

## 2018-04-13 DIAGNOSIS — I130 Hypertensive heart and chronic kidney disease with heart failure and stage 1 through stage 4 chronic kidney disease, or unspecified chronic kidney disease: Secondary | ICD-10-CM | POA: Diagnosis present

## 2018-04-13 DIAGNOSIS — Z7984 Long term (current) use of oral hypoglycemic drugs: Secondary | ICD-10-CM | POA: Diagnosis not present

## 2018-04-13 DIAGNOSIS — I129 Hypertensive chronic kidney disease with stage 1 through stage 4 chronic kidney disease, or unspecified chronic kidney disease: Secondary | ICD-10-CM | POA: Diagnosis present

## 2018-04-13 DIAGNOSIS — E785 Hyperlipidemia, unspecified: Secondary | ICD-10-CM | POA: Diagnosis present

## 2018-04-13 DIAGNOSIS — I5033 Acute on chronic diastolic (congestive) heart failure: Secondary | ICD-10-CM | POA: Diagnosis present

## 2018-04-13 DIAGNOSIS — E875 Hyperkalemia: Secondary | ICD-10-CM | POA: Diagnosis not present

## 2018-04-13 DIAGNOSIS — M109 Gout, unspecified: Secondary | ICD-10-CM | POA: Diagnosis present

## 2018-04-13 DIAGNOSIS — R609 Edema, unspecified: Secondary | ICD-10-CM | POA: Diagnosis not present

## 2018-04-13 DIAGNOSIS — Z6841 Body Mass Index (BMI) 40.0 and over, adult: Secondary | ICD-10-CM | POA: Diagnosis not present

## 2018-04-13 DIAGNOSIS — Z79890 Hormone replacement therapy (postmenopausal): Secondary | ICD-10-CM | POA: Diagnosis not present

## 2018-04-13 DIAGNOSIS — E1122 Type 2 diabetes mellitus with diabetic chronic kidney disease: Secondary | ICD-10-CM | POA: Diagnosis present

## 2018-04-13 DIAGNOSIS — N2581 Secondary hyperparathyroidism of renal origin: Secondary | ICD-10-CM | POA: Diagnosis not present

## 2018-04-13 DIAGNOSIS — R0902 Hypoxemia: Secondary | ICD-10-CM | POA: Diagnosis present

## 2018-04-13 DIAGNOSIS — I371 Nonrheumatic pulmonary valve insufficiency: Secondary | ICD-10-CM | POA: Diagnosis not present

## 2018-04-13 DIAGNOSIS — E11649 Type 2 diabetes mellitus with hypoglycemia without coma: Secondary | ICD-10-CM | POA: Diagnosis present

## 2018-04-13 DIAGNOSIS — F17290 Nicotine dependence, other tobacco product, uncomplicated: Secondary | ICD-10-CM | POA: Diagnosis present

## 2018-04-13 DIAGNOSIS — T464X5A Adverse effect of angiotensin-converting-enzyme inhibitors, initial encounter: Secondary | ICD-10-CM | POA: Diagnosis present

## 2018-04-13 DIAGNOSIS — N184 Chronic kidney disease, stage 4 (severe): Secondary | ICD-10-CM | POA: Diagnosis present

## 2018-04-13 DIAGNOSIS — F70 Mild intellectual disabilities: Secondary | ICD-10-CM | POA: Diagnosis not present

## 2018-04-13 DIAGNOSIS — N183 Chronic kidney disease, stage 3 (moderate): Secondary | ICD-10-CM | POA: Diagnosis not present

## 2018-04-13 DIAGNOSIS — E039 Hypothyroidism, unspecified: Secondary | ICD-10-CM | POA: Diagnosis present

## 2018-04-13 DIAGNOSIS — I10 Essential (primary) hypertension: Secondary | ICD-10-CM | POA: Diagnosis not present

## 2018-04-13 LAB — BASIC METABOLIC PANEL
Anion gap: 6 (ref 5–15)
Anion gap: 8 (ref 5–15)
BUN: 31 mg/dL — ABNORMAL HIGH (ref 6–20)
BUN: 32 mg/dL — AB (ref 6–20)
CALCIUM: 8.6 mg/dL — AB (ref 8.9–10.3)
CHLORIDE: 101 mmol/L (ref 101–111)
CHLORIDE: 104 mmol/L (ref 101–111)
CO2: 27 mmol/L (ref 22–32)
CO2: 26 mmol/L (ref 22–32)
CREATININE: 1.92 mg/dL — AB (ref 0.44–1.00)
Calcium: 8.6 mg/dL — ABNORMAL LOW (ref 8.9–10.3)
Creatinine, Ser: 1.84 mg/dL — ABNORMAL HIGH (ref 0.44–1.00)
GFR calc Af Amer: 32 mL/min — ABNORMAL LOW (ref >60)
GFR calc non Af Amer: 28 mL/min — ABNORMAL LOW (ref >60)
GFR, EST AFRICAN AMERICAN: 31 mL/min — AB (ref >60)
GFR, EST NON AFRICAN AMERICAN: 26 mL/min — AB (ref >60)
GLUCOSE: 120 mg/dL — AB (ref 65–99)
Glucose, Bld: 110 mg/dL — ABNORMAL HIGH (ref 65–99)
POTASSIUM: 5.7 mmol/L — AB (ref 3.5–5.1)
Potassium: 5.5 mmol/L — ABNORMAL HIGH (ref 3.5–5.1)
SODIUM: 138 mmol/L (ref 135–145)
Sodium: 134 mmol/L — ABNORMAL LOW (ref 135–145)

## 2018-04-13 LAB — GLUCOSE, CAPILLARY
GLUCOSE-CAPILLARY: 109 mg/dL — AB (ref 65–99)
GLUCOSE-CAPILLARY: 115 mg/dL — AB (ref 65–99)
GLUCOSE-CAPILLARY: 124 mg/dL — AB (ref 65–99)
GLUCOSE-CAPILLARY: 133 mg/dL — AB (ref 65–99)
Glucose-Capillary: 126 mg/dL — ABNORMAL HIGH (ref 65–99)
Glucose-Capillary: 138 mg/dL — ABNORMAL HIGH (ref 65–99)
Glucose-Capillary: 144 mg/dL — ABNORMAL HIGH (ref 65–99)

## 2018-04-13 LAB — HIV ANTIBODY (ROUTINE TESTING W REFLEX): HIV Screen 4th Generation wRfx: NONREACTIVE

## 2018-04-13 MED ORDER — FUROSEMIDE 10 MG/ML IJ SOLN: 20.0000 mg | Freq: Two times a day (BID) | INTRAMUSCULAR | Status: DC

## 2018-04-13 MED ORDER — FUROSEMIDE 10 MG/ML IJ SOLN
20.0000 mg | Freq: Once | INTRAMUSCULAR | Status: AC
Start: 2018-04-13 — End: 2018-04-13
  Administered 2018-04-13: 20 mg via INTRAVENOUS
  Filled 2018-04-13: qty 2

## 2018-04-13 MED ORDER — SODIUM POLYSTYRENE SULFONATE 15 GM/60ML PO SUSP
15.0000 g | Freq: Once | ORAL | Status: DC
  Filled 2018-04-13: qty 60

## 2018-04-13 MED ORDER — FUROSEMIDE 10 MG/ML IJ SOLN
20.0000 mg | Freq: Two times a day (BID) | INTRAMUSCULAR | Status: DC
  Administered 2018-04-13 – 2018-04-18 (×10): 20 mg via INTRAVENOUS
  Filled 2018-04-13 (×10): qty 2

## 2018-04-13 MED ORDER — SODIUM POLYSTYRENE SULFONATE PO POWD
15.0000 g | Freq: Once | ORAL | Status: AC
  Administered 2018-04-13: 15 g via ORAL
  Filled 2018-04-13: qty 15

## 2018-04-13 NOTE — Progress Notes (Addendum)
[facility]  PROGRESS NOTE  Kayla Hunt OZD:[id number] DOB: August 13, 1951 DOA: 04/11/2018 PCP: Reymundo Poll, MD   Brief HPI:   67 y.o. female with history of diabetes mellitus type 2, chronic kidney disease stage IV, hypertension, hypothyroidism, gout was referred from the skilled nursing facility after patient potassium was found to be around 6.8.  Patient states she was noticed to have lower extremity edema and had lab works done which showed an elevated potassium and was referred to the ER patient denies any chest pain shortness of breath nausea vomiting or diarrhea.    Subjective   Patient seen and examined, developed dyspnea on exertion yesterday.  Required oxygen.  Chest x-ray shows pulmonary edema.   Assessment/Plan:     1. Hyperkalemia, with history of CKD stage IV-potassium still elevated at 5.8, she was given Kayexalate 30 g p.o. x1 yesterday, likely patient being on lisinopril with underlying CKD.  Lisinopril is currently on hold.  Patient has been started on patiromer also was given D50 and IV insulin with bicarb.  Will repeat BMP today.  Continue Lasix.   2. Pulmonary edema/lower extremity edema-venous duplex of lower extremities is negative for DVT.   Patient was started on p.o. Lasix, will discontinue Lasix and start Lasix 20 mg IV every 12 hours.  Repeat chest x-ray showed persistent interstitial edema.  Will obtain echocardiogram. 3. Hypertension-lisinopril is currently on hold.  Continue metoprolol, hydralazine 4. Diabetes mellitus-patient had an episode of hypoglycemia in the ED, oral hypoglycemics currently on hold.  Continue sliding scale insulin with NovoLog. 5. Hypothyroidism-continue Synthroid 6. Anemia likely from underlying CKD-stable .     DVT prophylaxis: Heparin  Code Status: Full code  Family Communication: No family at bedside  Disposition Plan: likely back to skilled facility in  a.m.   Consultants:  None  Procedures:  None   Antibiotics:   Anti-infectives (From admission, onward)   None       Objective   Vitals:   04/12/18 2001 04/13/18 0347 04/13/18 1006 04/13/18 1008  BP: 133/65 (!) 141/70  122/61  Pulse: 64 69 73 69  Resp: (!) 24 20 20    Temp: 98.6 F (37 C) 98.4 F (36.9 C) 99.1 F (37.3 C)   TempSrc: Oral Oral Oral   SpO2: 96% 97% 96%   Weight:  (!) 138.3 kg (304 lb 14.3 oz)    Height:        Intake/Output Summary (Last 24 hours) at 04/13/2018 1523 Last data filed at 04/13/2018 0600 Gross per 24 hour  Intake 0 ml  Output 0 ml  Net 0 ml   Filed Weights   04/11/18 1810 04/12/18 1700 04/13/18 0347  Weight: 136.1 kg (300 lb) (!) 138.3 kg (304 lb 14.3 oz) (!) 138.3 kg (304 lb 14.3 oz)     Physical Examination:   Physical Exam:  Mouth: Oral mucosa is moist, no lesions on palate,  Neck: Supple, no deformities, masses, or tenderness Lungs: Normal respiratory effort, bibasilar crackles auscultated Heart: Regular rate and rhythm, S1 and S2 normal, no murmurs, rubs auscultated Abdomen: BS normoactive,soft,nondistended,non-tender to palpation,no organomegaly Extremities: No pretibial edema, no erythema, no cyanosis, no clubbing Neuro : Alert and oriented to time, place and person, No focal deficits      Data Reviewed: I have personally reviewed following labs and imaging studies  CBG: Recent Labs  Lab 04/12/18 2002 04/13/18 0010 04/13/18 0348 04/13/18 0740 04/13/18 1147  GLUCAP 166* 133* 126* 109* 124*    CBC: Recent Labs  Lab 04/11/18 1827 04/12/18 1337  WBC 4.5 4.1  NEUTROABS 2.2  --   HGB 10.8* 11.0*  HCT 37.2 38.1  MCV 98.4 98.4  PLT 193 209    Basic Metabolic Panel: Recent Labs  Lab 04/11/18 1827 04/12/18 1337 04/13/18 0802  NA 136 137 134*  K 6.8* 6.3* 5.7*  CL 104 101 101  CO2 25 28 27   GLUCOSE 121* 127* 120*  BUN 42* 32* 31*  CREATININE 2.10* 1.69* 1.84*  CALCIUM 8.6* 8.9 8.6*     Recent Results (from the past 240 hour(s))  MRSA PCR Screening     Status: None   Collection Time: 04/12/18  7:57 PM  Result Value Ref Range Status   MRSA by PCR NEGATIVE NEGATIVE Final    Comment:        The GeneXpert MRSA Assay (FDA approved for NASAL specimens only), is one component of a comprehensive MRSA colonization surveillance program. It is not intended to diagnose MRSA infection nor to guide or monitor treatment for MRSA infections. Performed at [facility], [place] [address], [place], [place] [address]      Liver Function Tests: Recent Labs  Lab 04/11/18 1827  AST 26  ALT 13*  ALKPHOS 65  BILITOT 0.6  PROT 6.7  ALBUMIN 2.9*   No results for input(s): LIPASE, AMYLASE in the last 168 hours. No results for input(s): AMMONIA in the last 168 hours.  Cardiac Enzymes: No results for input(s): CKTOTAL, CKMB, CKMBINDEX, TROPONINI in the last 168 hours. BNP (last 3 results) No results for input(s): BNP in the last 8760 hours.  ProBNP (last 3 results) No results for input(s): PROBNP in the last 8760 hours.    Studies: Dg Chest 2 View  Result Date: 04/13/2018 CLINICAL DATA:  Shortness of Breath EXAM: CHEST - 2 VIEW COMPARISON:  04/12/2018 FINDINGS: Cardiomegaly with vascular congestion. Diffuse interstitial prominence throughout the lungs could reflect early interstitial edema. Small effusions suspected. No acute bony abnormality. IMPRESSION: Suspect mild interstitial edema/CHF and small effusions. Electronically Signed   By: Rolm Baptise M.D.   On: 04/13/2018 11:14   Dg Chest Port 1 View  Result Date: 04/12/2018 CLINICAL DATA:  Edema EXAM: PORTABLE CHEST 1 VIEW COMPARISON:  05/06/2011 FINDINGS: Cardiac enlargement with pulmonary vascular congestion. Probable interstitial edema. Negative for effusion. Lungs are well aerated. IMPRESSION: Cardiac enlargement with vascular congestion and mild interstitial edema. Electronically Signed   By: Franchot Gallo M.D.    On: 04/12/2018 07:27    Scheduled Meds: . allopurinol  100 mg Oral Daily  . dextrose  1 ampule Intravenous Once  . furosemide  20 mg Intravenous Once  . furosemide  20 mg Oral Daily  . heparin  5,000 Units Subcutaneous Q8H  . hydrALAZINE  50 mg Oral BID  . levothyroxine  112 mcg Oral QAC breakfast  . metoprolol tartrate  50 mg Oral BID  . simvastatin  20 mg Oral Daily      Time spent: 25 min  [facility] Pager [phone]. If 7PM-7AM, please contact night-coverage at [email or web address], Office  [phone]  password [email or web address]  04/13/2018, 3:23 PM  LOS: 0 days

## 2018-04-14 LAB — BASIC METABOLIC PANEL
ANION GAP: 7 (ref 5–15)
BUN: 33 mg/dL — ABNORMAL HIGH (ref 6–20)
CHLORIDE: 102 mmol/L (ref 101–111)
CO2: 29 mmol/L (ref 22–32)
Calcium: 8.5 mg/dL — ABNORMAL LOW (ref 8.9–10.3)
Creatinine, Ser: 2.16 mg/dL — ABNORMAL HIGH (ref 0.44–1.00)
GFR calc Af Amer: 27 mL/min — ABNORMAL LOW (ref >60)
GFR calc non Af Amer: 23 mL/min — ABNORMAL LOW (ref >60)
GLUCOSE: 122 mg/dL — AB (ref 65–99)
POTASSIUM: 5.1 mmol/L (ref 3.5–5.1)
Sodium: 138 mmol/L (ref 135–145)

## 2018-04-14 LAB — GLUCOSE, CAPILLARY
GLUCOSE-CAPILLARY: 99 mg/dL (ref 65–99)
GLUCOSE-CAPILLARY: 147 mg/dL — AB (ref 65–99)
Glucose-Capillary: 123 mg/dL — ABNORMAL HIGH (ref 65–99)
Glucose-Capillary: 111 mg/dL — ABNORMAL HIGH (ref 65–99)
Glucose-Capillary: 112 mg/dL — ABNORMAL HIGH (ref 65–99)

## 2018-04-14 NOTE — Progress Notes (Addendum)
[facility]  PROGRESS NOTE  Kayla Hunt [id number] DOB: 12-25-1950 DOA: 04/11/2018 PCP: Reymundo Poll, MD   Brief HPI:   67 y.o. female with history of diabetes mellitus type 2, chronic kidney disease stage IV, hypertension, hypothyroidism, gout was referred from the skilled nursing facility after patient potassium was found to be around 6.8.  Patient states she was noticed to have lower extremity edema and had lab works done which showed an elevated potassium and was referred to the ER patient denies any chest pain shortness of breath nausea vomiting or diarrhea.    Subjective   Patient seen and examined, still has shortness of breath.  O2 sats 68% on room air at rest.  Improved to 94% on 3 L of oxygen.   Assessment/Plan:     1. Hyperkalemia, with history of CKD stage IV improved, today potassium is 5.1-patient came with potassium of 6.8 and was given patiromer as potassium was still elevated at 5.8, she was given Kayexalate 30 g p.o. x1 .  Hyperkalemia likely  being on lisinopril with underlying CKD.  Lisinopril is currently on hold.  Patient has been started on patiromer also was given D50 and IV insulin with bicarb.  Will repeat BMP in am.  Continue Lasix.   2. Pulmonary edema/lower extremity edema-venous duplex of lower extremities is negative for DVT.   Patient was started on p.o. Lasix, which was discontinued and patient started on Lasix 20 mg IV every 12 hours.  Echocardiogram has been ordered and is currently pending.    Repeat chest x-ray showed persistent interstitial edema.   3. Hypertension-lisinopril is currently on hold.  Continue metoprolol, hydralazine 4. CKD stage IV-creatinine is around 2 which is at her baseline.  Patient is on IV Lasix, follow BMP in am. 5. Diabetes mellitus-patient had an episode of hypoglycemia in the ED, oral hypoglycemics currently on hold.  Continue sliding scale insulin with NovoLog. 6. Hypothyroidism-continue Synthroid 7. Anemia  likely from underlying CKD-stable .     DVT prophylaxis: Heparin  Code Status: Full code  Family Communication: No family at bedside  Disposition Plan: likely back to skilled facility in a.m.   Consultants:  None  Procedures:  None   Antibiotics:   Anti-infectives (From admission, onward)   None       Objective   Vitals:   04/13/18 2015 04/14/18 0409 04/14/18 0610 04/14/18 0806  BP: (!) 143/67 131/75  133/65  Pulse: 65 77 68 68  Resp: 20 20  20   Temp: 99.8 F (37.7 C) (S) (!) 102.1 F (38.9 C) (!) 100.8 F (38.2 C) 99.4 F (37.4 C)  TempSrc: Oral Oral Oral Oral  SpO2: 100% 93% 98% (!) 87%  Weight: 136 kg (299 lb 13.2 oz)     Height:        Intake/Output Summary (Last 24 hours) at 04/14/2018 1442 Last data filed at 04/14/2018 0919 Gross per 24 hour  Intake 320 ml  Output 800 ml  Net -480 ml   Filed Weights   04/12/18 1700 04/13/18 0347 04/13/18 2015  Weight: (!) 138.3 kg (304 lb 14.3 oz) (!) 138.3 kg (304 lb 14.3 oz) 136 kg (299 lb 13.2 oz)     Physical Examination:   Mouth: Oral mucosa is moist, no lesions on palate,  Neck: Supple, no deformities, masses, or tenderness Lungs: Normal respiratory effort, decreased breath sounds bilaterally Heart: Regular rate and rhythm, S1 and S2 normal, no murmurs, rubs auscultated Abdomen: BS normoactive,soft,nondistended,non-tender to palpation,no organomegaly Extremities:  No pretibial edema, no erythema, no cyanosis, no clubbing Neuro : Alert and oriented to time, place and person, No focal deficits      Data Reviewed: I have personally reviewed following labs and imaging studies  CBG: Recent Labs  Lab 04/13/18 2016 04/13/18 2351 04/14/18 0406 04/14/18 0803 04/14/18 1128  GLUCAP 138* 144* 123* 99 147*    CBC: Recent Labs  Lab 04/11/18 1827 04/12/18 1337  WBC 4.5 4.1  NEUTROABS 2.2  --   HGB 10.8* 11.0*  HCT 37.2 38.1  MCV 98.4 98.4  PLT 193 595    Basic Metabolic Panel: Recent  Labs  Lab 04/11/18 1827 04/12/18 1337 04/13/18 0802 04/13/18 1631 04/14/18 0540  NA 136 137 134* 138 138  K 6.8* 6.3* 5.7* 5.5* 5.1  CL 104 101 101 104 102  CO2 25 28 27 26 29   GLUCOSE 121* 127* 120* 110* 122*  BUN 42* 32* 31* 32* 33*  CREATININE 2.10* 1.69* 1.84* 1.92* 2.16*  CALCIUM 8.6* 8.9 8.6* 8.6* 8.5*    Recent Results (from the past 240 hour(s))  MRSA PCR Screening     Status: None   Collection Time: 04/12/18  7:57 PM  Result Value Ref Range Status   MRSA by PCR NEGATIVE NEGATIVE Final    Comment:        The GeneXpert MRSA Assay (FDA approved for NASAL specimens only), is one component of a comprehensive MRSA colonization surveillance program. It is not intended to diagnose MRSA infection nor to guide or monitor treatment for MRSA infections. Performed at [facility], [place] [address], [place], [place] [address]      Liver Function Tests: Recent Labs  Lab 04/11/18 1827  AST 26  ALT 13*  ALKPHOS 65  BILITOT 0.6  PROT 6.7  ALBUMIN 2.9*   No results for input(s): LIPASE, AMYLASE in the last 168 hours. No results for input(s): AMMONIA in the last 168 hours.  Cardiac Enzymes: No results for input(s): CKTOTAL, CKMB, CKMBINDEX, TROPONINI in the last 168 hours. BNP (last 3 results) No results for input(s): BNP in the last 8760 hours.  ProBNP (last 3 results) No results for input(s): PROBNP in the last 8760 hours.    Studies: Dg Chest 2 View  Result Date: 04/13/2018 CLINICAL DATA:  Shortness of Breath EXAM: CHEST - 2 VIEW COMPARISON:  04/12/2018 FINDINGS: Cardiomegaly with vascular congestion. Diffuse interstitial prominence throughout the lungs could reflect early interstitial edema. Small effusions suspected. No acute bony abnormality. IMPRESSION: Suspect mild interstitial edema/CHF and small effusions. Electronically Signed   By: Rolm Baptise M.D.   On: 04/13/2018 11:14    Scheduled Meds: . allopurinol  100 mg Oral Daily  . dextrose  1 ampule  Intravenous Once  . furosemide  20 mg Intravenous Once  . furosemide  20 mg Intravenous Q12H  . heparin  5,000 Units Subcutaneous Q8H  . hydrALAZINE  50 mg Oral BID  . levothyroxine  112 mcg Oral QAC breakfast  . metoprolol tartrate  50 mg Oral BID  . simvastatin  20 mg Oral Daily      Time spent: 25 min  [facility] Pager [phone]. If 7PM-7AM, please contact night-coverage at [email or web address], Office  [phone]  password [email or web address]  04/14/2018, 2:42 PM  LOS: 1 day

## 2018-04-14 NOTE — Progress Notes (Signed)
SATURATION QUALIFICATIONS:  Patient Saturations on Room Air at Rest = 68%  Patient Saturations after being placed on 2 L/M [place] at Rest = 85%  Patient Saturations on 3 Liters of oxygen at rest = 94%  Patient is unable to ambulate.   Dorthey Sawyer, RN

## 2018-04-15 LAB — GLUCOSE, CAPILLARY
GLUCOSE-CAPILLARY: 88 mg/dL (ref 65–99)
GLUCOSE-CAPILLARY: 96 mg/dL (ref 65–99)
GLUCOSE-CAPILLARY: 83 mg/dL (ref 65–99)
GLUCOSE-CAPILLARY: 191 mg/dL — AB (ref 65–99)
Glucose-Capillary: 90 mg/dL (ref 65–99)
Glucose-Capillary: 137 mg/dL — ABNORMAL HIGH (ref 65–99)

## 2018-04-15 LAB — BASIC METABOLIC PANEL
ANION GAP: 8 (ref 5–15)
BUN: 35 mg/dL — ABNORMAL HIGH (ref 6–20)
CALCIUM: 8.4 mg/dL — AB (ref 8.9–10.3)
CHLORIDE: 102 mmol/L (ref 101–111)
CO2: 29 mmol/L (ref 22–32)
CREATININE: 2.17 mg/dL — AB (ref 0.44–1.00)
GFR calc non Af Amer: 23 mL/min — ABNORMAL LOW (ref >60)
GFR, EST AFRICAN AMERICAN: 26 mL/min — AB (ref >60)
Glucose, Bld: 85 mg/dL (ref 65–99)
Potassium: 4.6 mmol/L (ref 3.5–5.1)
SODIUM: 139 mmol/L (ref 135–145)

## 2018-04-15 LAB — URINALYSIS, ROUTINE W REFLEX MICROSCOPIC
Bacteria, UA: NONE SEEN
Bilirubin Urine: NEGATIVE
GLUCOSE, UA: NEGATIVE mg/dL
HGB URINE DIPSTICK: NEGATIVE
Ketones, ur: NEGATIVE mg/dL
Leukocytes, UA: NEGATIVE
Nitrite: NEGATIVE
PH: 5 (ref 5.0–8.0)
Protein, ur: 100 mg/dL — AB
Specific Gravity, Urine: 1.009 (ref 1.005–1.030)

## 2018-04-15 NOTE — Progress Notes (Signed)
[facility]  PROGRESS NOTE  Kayla Hunt [id number]:[id number] DOB: [birth year] DOA: 04/11/2018 PCP: Reymundo Poll, MD   Brief HPI:   67 y.o. female with history of diabetes mellitus type 2, chronic kidney disease stage IV, hypertension, hypothyroidism, gout was referred from the skilled nursing facility after patient potassium was found to be around 6.8.  Patient states she was noticed to have lower extremity edema and had lab works done which showed an elevated potassium and was referred to the ER patient denies any chest pain shortness of breath nausea vomiting or diarrhea.    Subjective   Patient seen and examined, breathing is improved.  Still requiring oxygen.  Good diuresis with Lasix.   Assessment/Plan:     1. Hyperkalemia, with history of CKD stage IV improved, today potassium is 4.6-patient came with potassium of 6.8 and was given patiromer as potassium was still elevated at 5.8, she was given Kayexalate 30 g p.o. x1 .  Hyperkalemia likely  being on lisinopril with underlying CKD.  Lisinopril is currently on hold.  Patient has been started on patiromer also was given D50 and IV insulin with bicarb.  Will repeat BMP in am.  Continue Lasix.   2. Low-grade fever-patient had low-grade fever 100.1, will obtain UA and urine culture.  3. Pulmonary edema/lower extremity edema-venous duplex of lower extremities is negative for DVT.   Patient was started on p.o. Lasix, which was discontinued and patient started on Lasix 20 mg IV every 12 hours.  Good diuresis.  Echocardiogram has been ordered and is currently pending.    Repeat chest x-ray showed persistent interstitial edema.   4. Hypertension-lisinopril is currently on hold.  Continue metoprolol, hydralazine 5. CKD stage IV-creatinine is around 2 which is at her baseline.  Patient is on IV Lasix, follow BMP in am 6. Diabetes mellitus-patient had an episode of hypoglycemia in the ED, oral hypoglycemics currently on hold.  Continue sliding  scale insulin with NovoLog. 7. Hypothyroidism-continue Synthroid 8. Anemia likely from underlying CKD-stable .     DVT prophylaxis: Heparin  Code Status: Full code  Family Communication: No family at bedside  Disposition Plan: likely back to skilled facility in a.m.   Consultants:  None  Procedures:  None   Antibiotics:   Anti-infectives (From admission, onward)   None       Objective   Vitals:   04/15/18 0505 04/15/18 0758 04/15/18 0905 04/15/18 0907  BP:  (!) 134/51    Pulse: 76 70 74 71  Resp:  20    Temp:  99.1 F (37.3 C)    TempSrc:  Oral    SpO2: 90% 98% (!) 78% 97%  Weight:      Height:        Intake/Output Summary (Last 24 hours) at 04/15/2018 1223 Last data filed at 04/15/2018 0600 Gross per 24 hour  Intake 0 ml  Output 1100 ml  Net -1100 ml   Filed Weights   04/13/18 0347 04/13/18 2015 04/14/18 2015  Weight: (!) 138.3 kg (304 lb 14.3 oz) 136 kg (299 lb 13.2 oz) (!) 137.2 kg (302 lb 7.5 oz)     Physical Examination:    Mouth: Oral mucosa is moist, no lesions on palate,  Neck: Supple, no deformities, masses, or tenderness Lungs: Normal respiratory effort, decreased breath sounds bilaterally Heart: Regular rate and rhythm, S1 and S2 normal, no murmurs, rubs auscultated Abdomen: BS normoactive,soft,nondistended,non-tender to palpation,no organomegaly Extremities: No pretibial edema, no erythema, no cyanosis, no clubbing Neuro :  Alert and oriented to time, place and person, No focal deficits       Data Reviewed: I have personally reviewed following labs and imaging studies  CBG: Recent Labs  Lab 04/14/18 2018 04/15/18 0146 04/15/18 0501 04/15/18 0757 04/15/18 1117  GLUCAP 112* 88 96 83 90    CBC: Recent Labs  Lab 04/11/18 1827 04/12/18 1337  WBC 4.5 4.1  NEUTROABS 2.2  --   HGB 10.8* 11.0*  HCT 37.2 38.1  MCV 98.4 98.4  PLT 193 496    Basic Metabolic Panel: Recent Labs  Lab 04/12/18 1337 04/13/18 0802  04/13/18 1631 04/14/18 0540 04/15/18 0822  NA 137 134* 138 138 139  K 6.3* 5.7* 5.5* 5.1 4.6  CL 101 101 104 102 102  CO2 28 27 26 29 29   GLUCOSE 127* 120* 110* 122* 85  BUN 32* 31* 32* 33* 35*  CREATININE 1.69* 1.84* 1.92* 2.16* 2.17*  CALCIUM 8.9 8.6* 8.6* 8.5* 8.4*    Recent Results (from the past 240 hour(s))  MRSA PCR Screening     Status: None   Collection Time: 04/12/18  7:57 PM  Result Value Ref Range Status   MRSA by PCR NEGATIVE NEGATIVE Final    Comment:        The GeneXpert MRSA Assay (FDA approved for NASAL specimens only), is one component of a comprehensive MRSA colonization surveillance program. It is not intended to diagnose MRSA infection nor to guide or monitor treatment for MRSA infections. Performed at [facility], [place] [address], [place], [place] [address]      Liver Function Tests: Recent Labs  Lab 04/11/18 1827  AST 26  ALT 13*  ALKPHOS 65  BILITOT 0.6  PROT 6.7  ALBUMIN 2.9*   No results for input(s): LIPASE, AMYLASE in the last 168 hours. No results for input(s): AMMONIA in the last 168 hours.  Cardiac Enzymes: No results for input(s): CKTOTAL, CKMB, CKMBINDEX, TROPONINI in the last 168 hours. BNP (last 3 results) No results for input(s): BNP in the last 8760 hours.  ProBNP (last 3 results) No results for input(s): PROBNP in the last 8760 hours.    Studies: No results found.  Scheduled Meds: . allopurinol  100 mg Oral Daily  . dextrose  1 ampule Intravenous Once  . furosemide  20 mg Intravenous Once  . furosemide  20 mg Intravenous Q12H  . heparin  5,000 Units Subcutaneous Q8H  . hydrALAZINE  50 mg Oral BID  . levothyroxine  112 mcg Oral QAC breakfast  . metoprolol tartrate  50 mg Oral BID  . simvastatin  20 mg Oral Daily      Time spent: 25 min  [facility] Pager [phone]. If 7PM-7AM, please contact night-coverage at [email or web address], Office  [phone]  password  [email or web address]  04/15/2018, 12:23 PM  LOS: 2 days

## 2018-04-16 ENCOUNTER — Inpatient Hospital Stay (HOSPITAL_COMMUNITY): Payer: Medicaid Other

## 2018-04-16 DIAGNOSIS — L03115 Cellulitis of right lower limb: Secondary | ICD-10-CM

## 2018-04-16 DIAGNOSIS — I371 Nonrheumatic pulmonary valve insufficiency: Secondary | ICD-10-CM

## 2018-04-16 LAB — URINE CULTURE: CULTURE: NO GROWTH

## 2018-04-16 LAB — ECHOCARDIOGRAM COMPLETE
Height: 64 in
WEIGHTICAEL: 4790.15 [oz_av]

## 2018-04-16 LAB — BASIC METABOLIC PANEL
Anion gap: 7 (ref 5–15)
BUN: 36 mg/dL — ABNORMAL HIGH (ref 6–20)
CHLORIDE: 100 mmol/L — AB (ref 101–111)
CO2: 33 mmol/L — ABNORMAL HIGH (ref 22–32)
CREATININE: 2 mg/dL — AB (ref 0.44–1.00)
Calcium: 8.5 mg/dL — ABNORMAL LOW (ref 8.9–10.3)
GFR calc Af Amer: 29 mL/min — ABNORMAL LOW (ref >60)
GFR calc non Af Amer: 25 mL/min — ABNORMAL LOW (ref >60)
Glucose, Bld: 120 mg/dL — ABNORMAL HIGH (ref 65–99)
Potassium: 4.9 mmol/L (ref 3.5–5.1)
Sodium: 140 mmol/L (ref 135–145)

## 2018-04-16 LAB — CBC
HEMATOCRIT: 38.9 % (ref 36.0–46.0)
Hemoglobin: 11.2 g/dL — ABNORMAL LOW (ref 12.0–15.0)
MCH: 28.7 pg (ref 26.0–34.0)
MCHC: 28.8 g/dL — ABNORMAL LOW (ref 30.0–36.0)
MCV: 99.7 fL (ref 78.0–100.0)
PLATELETS: 190 10*3/uL (ref 150–400)
RBC: 3.9 MIL/uL (ref 3.87–5.11)
RDW: 15.4 % (ref 11.5–15.5)
WBC: 6.5 10*3/uL (ref 4.0–10.5)

## 2018-04-16 LAB — GLUCOSE, CAPILLARY
GLUCOSE-CAPILLARY: 117 mg/dL — AB (ref 65–99)
GLUCOSE-CAPILLARY: 110 mg/dL — AB (ref 65–99)
GLUCOSE-CAPILLARY: 106 mg/dL — AB (ref 65–99)
GLUCOSE-CAPILLARY: 137 mg/dL — AB (ref 65–99)
Glucose-Capillary: 129 mg/dL — ABNORMAL HIGH (ref 65–99)
Glucose-Capillary: 153 mg/dL — ABNORMAL HIGH (ref 65–99)

## 2018-04-16 NOTE — Progress Notes (Signed)
  Echocardiogram 2D Echocardiogram has been performed.  Kayla Hunt 04/16/2018, 11:24 AM

## 2018-04-16 NOTE — Clinical Social Work Note (Signed)
Patient is from [facility] and CSW has been attempting to reach patient's daughter, Phillis Haggis today to confirm discharge back to ALF when medially stable. CSW tried several times today (5) and got an automated VM and was unable to leave a voice mail. CSW talked with staff, Nigel Bridgeman, administrator and Guss Bunde, med tech regarding patient and confirmed that Ms. Nappi is from their facility. Per Administrator, patient has been there a few years. CSW was advised that they have the same phone number as the hospital and usually is able to leave a message. CSW consulted with clinical social work Tourist information centre manager regarding patient and discharge and she can return back to facility, even if we are unable to talk with daughter as patient came from [facility] and has been there several years. CSW reviewed MD's progress note for today and patient will probably be ready for discharge on Tuesday, 6/18. CSW will alert facility and attempt to reach daughter on the 70 th.  Lolah Coghlan Givens, MSW, LCSW Licensed Clinical Social Worker [place] [phone]

## 2018-04-16 NOTE — Progress Notes (Addendum)
[facility]  PROGRESS NOTE  Kayla Hunt [id number]:[id number] DOB: Apr 16, 1951 DOA: 04/11/2018 PCP: Reymundo Poll, MD   Brief HPI:   67 y.o. female with history of diabetes mellitus type 2, chronic kidney disease stage IV, hypertension, hypothyroidism, gout was referred from the skilled nursing facility after patient potassium was found to be around 6.8.  Patient states she was noticed to have lower extremity edema and had lab works done which showed an elevated potassium and was referred to the ER patient denies any chest pain shortness of breath nausea vomiting or diarrhea.    Subjective   Patient seen and examined, breathing is better. Good diuresis with IV Lasix   Assessment/Plan:     1. Hyperkalemia, with history of CKD stage IV - improved, today potassium is 4.6-patient came with potassium of 6.8 and was given patiromer as potassium was still elevated at 5.8, she was given Kayexalate 30 g p.o. x1 .  Hyperkalemia likely  being on lisinopril with underlying CKD.  Lisinopril is currently on hold.  Patient was initially started on  started on patiromer, which was changed to Kayexalate after one dose, also was given D50 and IV insulin with bicarb.  Will repeat BMP in am.  Continue Lasix.   2. Low-grade fever-patient had low-grade fever 100.1,urine culture showed no growth 3. ? Cellulitis- Right lower extremity is warm to touch, and tender to palpation, continue Rocephin. 4. Pulmonary edema/lower extremity edema-venous duplex of lower extremities is negative for DVT.   Patient was started on p.o. Lasix, which was discontinued and patient started on Lasix 20 mg IV every 12 hours.  Good diuresis.  Echocardiogram has been ordered and is currently pending. Repeat chest x-ray showed persistent interstitial edema.   5. Hypertension-lisinopril is currently on hold.  Continue metoprolol, hydralazine 6. CKD stage IV-creatinine is around 2 which is at her baseline.  Patient is on IV Lasix, follow BMP  in am 7. Diabetes mellitus-patient had an episode of hypoglycemia in the ED, oral hypoglycemics currently on hold.  Continue sliding scale insulin with NovoLog. 8. Hypothyroidism-continue Synthroid 9. Anemia likely from underlying CKD-stable .     DVT prophylaxis: Heparin  Code Status: Full code  Family Communication: No family at bedside  Disposition Plan: likely back to skilled facility in a.m.   Consultants:  None  Procedures:  None   Antibiotics:   Anti-infectives (From admission, onward)   None       Objective   Vitals:   04/16/18 0510 04/16/18 0630 04/16/18 0739 04/16/18 0949  BP: (!) 154/137   (!) 156/58  Pulse: 77   70  Resp: 20   20  Temp: 100.2 F (37.9 C) (!) 100.6 F (38.1 C) 99.6 F (37.6 C) 99.1 F (37.3 C)  TempSrc: Oral Oral  Oral  SpO2: 91%     Weight:      Height:        Intake/Output Summary (Last 24 hours) at 04/16/2018 1302 Last data filed at 04/16/2018 1221 Gross per 24 hour  Intake 360 ml  Output 700 ml  Net -340 ml   Filed Weights   04/13/18 2015 04/14/18 2015 04/15/18 2017  Weight: 136 kg (299 lb 13.2 oz) (!) 137.2 kg (302 lb 7.5 oz) 135.8 kg (299 lb 6.2 oz)     Physical Examination:     Neck: Supple, no deformities, masses, or tenderness Lungs: Normal respiratory effort, decreased breath sounds bilaterally Heart: Regular rate and rhythm, S1 and S2 normal, no murmurs, rubs  auscultated Abdomen: BS normoactive,soft,nondistended,non-tender to palpation,no organomegaly Extremities:  Trace edema, RLE is warm to touch, tender to palpation Neuro : Alert and oriented to time, place and person, No focal deficits Skin: No rashes seen on exam      Data Reviewed: I have personally reviewed following labs and imaging studies  CBG: Recent Labs  Lab 04/15/18 2020 04/16/18 0025 04/16/18 0506 04/16/18 0737 04/16/18 1133  GLUCAP 191* 129* 117* 110* 106*    CBC: Recent Labs  Lab 04/11/18 1827 04/12/18 1337  04/16/18 0643  WBC 4.5 4.1 6.5  NEUTROABS 2.2  --   --   HGB 10.8* 11.0* 11.2*  HCT 37.2 38.1 38.9  MCV 98.4 98.4 99.7  PLT 193 212 620    Basic Metabolic Panel: Recent Labs  Lab 04/13/18 0802 04/13/18 1631 04/14/18 0540 04/15/18 0822 04/16/18 0643  NA 134* 138 138 139 140  K 5.7* 5.5* 5.1 4.6 4.9  CL 101 104 102 102 100*  CO2 27 26 29 29  33*  GLUCOSE 120* 110* 122* 85 120*  BUN 31* 32* 33* 35* 36*  CREATININE 1.84* 1.92* 2.16* 2.17* 2.00*  CALCIUM 8.6* 8.6* 8.5* 8.4* 8.5*    Recent Results (from the past 240 hour(s))  MRSA PCR Screening     Status: None   Collection Time: 04/12/18  7:57 PM  Result Value Ref Range Status   MRSA by PCR NEGATIVE NEGATIVE Final    Comment:        The GeneXpert MRSA Assay (FDA approved for NASAL specimens only), is one component of a comprehensive MRSA colonization surveillance program. It is not intended to diagnose MRSA infection nor to guide or monitor treatment for MRSA infections. Performed at [facility], [place] [address], [place], [place] [address]   Culture, Urine     Status: None   Collection Time: 04/15/18 12:53 PM  Result Value Ref Range Status   Specimen Description URINE, RANDOM  Final   Special Requests NONE  Final   Culture   Final    NO GROWTH Performed at [facility], [place] [address] [place], [place] [address]    Report Status 04/16/2018 FINAL  Final     Liver Function Tests: Recent Labs  Lab 04/11/18 1827  AST 26  ALT 13*  ALKPHOS 65  BILITOT 0.6  PROT 6.7  ALBUMIN 2.9*   No results for input(s): LIPASE, AMYLASE in the last 168 hours. No results for input(s): AMMONIA in the last 168 hours.  Cardiac Enzymes: No results for input(s): CKTOTAL, CKMB, CKMBINDEX, TROPONINI in the last 168 hours. BNP (last 3 results) No results for input(s): BNP in the last 8760 hours.  ProBNP (last 3 results) No results for input(s): PROBNP in the last 8760 hours.    Studies: No results  found.  Scheduled Meds: . allopurinol  100 mg Oral Daily  . dextrose  1 ampule Intravenous Once  . furosemide  20 mg Intravenous Q12H  . heparin  5,000 Units Subcutaneous Q8H  . hydrALAZINE  50 mg Oral BID  . levothyroxine  112 mcg Oral QAC breakfast  . metoprolol tartrate  50 mg Oral BID  . simvastatin  20 mg Oral Daily      Time spent: 25 min  [facility] Pager [phone]. If 7PM-7AM, please contact night-coverage at [email or web address], Office  [phone]  password [email or web address]  04/16/2018, 1:02 PM  LOS: 3 days

## 2018-04-17 LAB — BASIC METABOLIC PANEL
Anion gap: 8 (ref 5–15)
BUN: 31 mg/dL — AB (ref 6–20)
CO2: 33 mmol/L — ABNORMAL HIGH (ref 22–32)
CREATININE: 1.83 mg/dL — AB (ref 0.44–1.00)
Calcium: 8.5 mg/dL — ABNORMAL LOW (ref 8.9–10.3)
Chloride: 100 mmol/L — ABNORMAL LOW (ref 101–111)
GFR calc Af Amer: 33 mL/min — ABNORMAL LOW (ref >60)
GFR, EST NON AFRICAN AMERICAN: 28 mL/min — AB (ref >60)
GLUCOSE: 100 mg/dL — AB (ref 65–99)
Potassium: 4.2 mmol/L (ref 3.5–5.1)
SODIUM: 141 mmol/L (ref 135–145)

## 2018-04-17 LAB — GLUCOSE, CAPILLARY
GLUCOSE-CAPILLARY: 110 mg/dL — AB (ref 65–99)
GLUCOSE-CAPILLARY: 125 mg/dL — AB (ref 65–99)
Glucose-Capillary: 89 mg/dL (ref 65–99)
Glucose-Capillary: 199 mg/dL — ABNORMAL HIGH (ref 65–99)
Glucose-Capillary: 163 mg/dL — ABNORMAL HIGH (ref 65–99)

## 2018-04-17 MED ORDER — HYDRALAZINE HCL 25 MG PO TABS
25.0000 mg | ORAL_TABLET | Freq: Two times a day (BID) | ORAL | Status: DC
  Administered 2018-04-17 – 2018-04-23 (×12): 25 mg via ORAL
  Filled 2018-04-17 (×12): qty 1

## 2018-04-17 MED ORDER — BENZONATATE 100 MG PO CAPS
200.0000 mg | ORAL_CAPSULE | Freq: Three times a day (TID) | ORAL | Status: DC | PRN
  Administered 2018-04-17 – 2018-04-20 (×4): 200 mg via ORAL
  Filled 2018-04-17 (×4): qty 2

## 2018-04-17 NOTE — Progress Notes (Signed)
[facility]  PROGRESS NOTE  Kayla Hunt MBW:[id number] DOB: 06-26-1951 DOA: 04/11/2018 PCP: Reymundo Poll, MD   Brief HPI:   67 y.o. female with history of diabetes mellitus type 2, chronic kidney disease stage IV, hypertension, hypothyroidism, gout was referred from the skilled nursing facility after patient potassium was found to be around 6.8.  Patient states she was noticed to have lower extremity edema and had lab works done which showed an elevated potassium and was referred to the ER patient denies any chest pain shortness of breath nausea vomiting or diarrhea.    Subjective    Patient seen and examined, breathing is slowly improving.  Assessment/Plan:     1. Hyperkalemia, with history of CKD stage IV - improved, today potassium is 4.6-patient came with potassium of 6.8 and was given patiromer as potassium was still elevated at 5.8, she was given Kayexalate 30 g p.o. x1 .  Hyperkalemia likely  being on lisinopril with underlying CKD.  Lisinopril is currently on hold.  Patient was initially started on  started on patiromer, which was changed to Kayexalate after one dose, also was given D50 and IV insulin with bicarb.  Will repeat BMP in am.  Continue Lasix.   2. Low-grade fever-patient had low-grade fever 100.1,urine culture showed no growth 3. ? Cellulitis-improved, right lower extremity was warm to touch, and tender to palpation, continue Rocephin. 4. Pulmonary edema/lower extremity edema-venous duplex of lower extremities is negative for DVT.   Patient was started on p.o. Lasix, which was discontinued and patient started on Lasix 20 mg IV every 12 hours.  Good diuresis.  Echocardiogram showed diastolic dysfunction. Repeat chest x-ray showed persistent interstitial edema.  Consider switching to p.o. Lasix in 1 to 2 days. 5. Hypertension-lisinopril is currently on hold.  Continue metoprolol, hydralazine.   6. CKD stage IV-creatinine is around 2 which is at her baseline.   Patient is on IV Lasix, follow BMP in am. Creatinine is slowly improving with diuresis.  Today creatinine is 1.83. 7. Diabetes mellitus-patient had an episode of hypoglycemia in the ED, oral hypoglycemics currently on hold.  Continue sliding scale insulin with NovoLog. 8. Hypothyroidism-continue Synthroid 9. Anemia likely from underlying CKD-stable .     DVT prophylaxis: Heparin  Code Status: Full code  Family Communication: No family at bedside  Disposition Plan: likely back to skilled facility in 1-2 days   Consultants:  None  Procedures:  None   Antibiotics:   Anti-infectives (From admission, onward)   None       Objective   Vitals:   04/17/18 0749 04/17/18 1027 04/17/18 1040 04/17/18 1050  BP: (!) 121/46 (!) 148/47  (!) 140/44  Pulse: 69 78    Resp:      Temp: 99.2 F (37.3 C)     TempSrc: Oral     SpO2: 100% 99% 97%   Weight:      Height:        Intake/Output Summary (Last 24 hours) at 04/17/2018 1327 Last data filed at 04/17/2018 0600 Gross per 24 hour  Intake 120 ml  Output 650 ml  Net -530 ml   Filed Weights   04/14/18 2015 04/15/18 2017 04/17/18 0557  Weight: (!) 137.2 kg (302 lb 7.5 oz) 135.8 kg (299 lb 6.2 oz) (!) 136.1 kg (300 lb 0.7 oz)     Physical Examination:     Neck: Supple, no deformities, masses, or tenderness Lungs: Normal respiratory effort, decreased breath sounds bilaterally Heart: Regular rate and rhythm,  S1 and S2 normal, no murmurs, rubs auscultated Abdomen: BS normoactive,soft,nondistended,non-tender to palpation,no organomegaly Extremities: Trace edema in the lower extremities, nontender to palpation, no warmth on palpation Neuro : Alert and oriented to time, place and person, No focal deficits       Data Reviewed: I have personally reviewed following labs and imaging studies  CBG: Recent Labs  Lab 04/16/18 1704 04/16/18 2029 04/17/18 0027 04/17/18 0752 04/17/18 1129  GLUCAP 153* 137* 110* 89 199*     CBC: Recent Labs  Lab 04/11/18 1827 04/12/18 1337 04/16/18 0643  WBC 4.5 4.1 6.5  NEUTROABS 2.2  --   --   HGB 10.8* 11.0* 11.2*  HCT 37.2 38.1 38.9  MCV 98.4 98.4 99.7  PLT 193 212 573    Basic Metabolic Panel: Recent Labs  Lab 04/13/18 1631 04/14/18 0540 04/15/18 0822 04/16/18 0643 04/17/18 0510  NA 138 138 139 140 141  K 5.5* 5.1 4.6 4.9 4.2  CL 104 102 102 100* 100*  CO2 26 29 29  33* 33*  GLUCOSE 110* 122* 85 120* 100*  BUN 32* 33* 35* 36* 31*  CREATININE 1.92* 2.16* 2.17* 2.00* 1.83*  CALCIUM 8.6* 8.5* 8.4* 8.5* 8.5*    Recent Results (from the past 240 hour(s))  MRSA PCR Screening     Status: None   Collection Time: 04/12/18  7:57 PM  Result Value Ref Range Status   MRSA by PCR NEGATIVE NEGATIVE Final    Comment:        The GeneXpert MRSA Assay (FDA approved for NASAL specimens only), is one component of a comprehensive MRSA colonization surveillance program. It is not intended to diagnose MRSA infection nor to guide or monitor treatment for MRSA infections. Performed at [facility], [place] [address], [place], [place] [address]   Culture, Urine     Status: None   Collection Time: 04/15/18 12:53 PM  Result Value Ref Range Status   Specimen Description URINE, RANDOM  Final   Special Requests NONE  Final   Culture   Final    NO GROWTH Performed at [facility], [place] [address], [place], [place] [address]    Report Status 04/16/2018 FINAL  Final     Liver Function Tests: Recent Labs  Lab 04/11/18 1827  AST 26  ALT 13*  ALKPHOS 65  BILITOT 0.6  PROT 6.7  ALBUMIN 2.9*   No results for input(s): LIPASE, AMYLASE in the last 168 hours. No results for input(s): AMMONIA in the last 168 hours.  Cardiac Enzymes: No results for input(s): CKTOTAL, CKMB, CKMBINDEX, TROPONINI in the last 168 hours. BNP (last 3 results) No results for input(s): BNP in the last 8760 hours.  ProBNP (last 3 results) No results for input(s): PROBNP in  the last 8760 hours.    Studies: No results found.  Scheduled Meds: . allopurinol  100 mg Oral Daily  . dextrose  1 ampule Intravenous Once  . furosemide  20 mg Intravenous Q12H  . heparin  5,000 Units Subcutaneous Q8H  . hydrALAZINE  25 mg Oral BID  . levothyroxine  112 mcg Oral QAC breakfast  . metoprolol tartrate  50 mg Oral BID  . simvastatin  20 mg Oral Daily      Time spent: 25 min  [facility] Pager [phone]. If 7PM-7AM, please contact night-coverage at [email or web address], Office  [phone]  password [email or web address]  04/17/2018, 1:27 PM  LOS: 4 days

## 2018-04-18 ENCOUNTER — Inpatient Hospital Stay (HOSPITAL_COMMUNITY): Payer: Medicaid Other

## 2018-04-18 DIAGNOSIS — F70 Mild intellectual disabilities: Secondary | ICD-10-CM

## 2018-04-18 DIAGNOSIS — E875 Hyperkalemia: Principal | ICD-10-CM

## 2018-04-18 DIAGNOSIS — I10 Essential (primary) hypertension: Secondary | ICD-10-CM

## 2018-04-18 DIAGNOSIS — N2581 Secondary hyperparathyroidism of renal origin: Secondary | ICD-10-CM

## 2018-04-18 LAB — BLOOD CULTURE ID PANEL (REFLEXED)

## 2018-04-18 LAB — BASIC METABOLIC PANEL
Anion gap: 8 (ref 5–15)
BUN: 34 mg/dL — AB (ref 6–20)
CALCIUM: 8.4 mg/dL — AB (ref 8.9–10.3)
CHLORIDE: 100 mmol/L — AB (ref 101–111)
CO2: 32 mmol/L (ref 22–32)
CREATININE: 2.01 mg/dL — AB (ref 0.44–1.00)
GFR calc non Af Amer: 25 mL/min — ABNORMAL LOW (ref >60)
GFR, EST AFRICAN AMERICAN: 29 mL/min — AB (ref >60)
Glucose, Bld: 134 mg/dL — ABNORMAL HIGH (ref 65–99)
Potassium: 4.4 mmol/L (ref 3.5–5.1)
Sodium: 140 mmol/L (ref 135–145)

## 2018-04-18 LAB — CBC
HCT: 33.7 % — ABNORMAL LOW (ref 36.0–46.0)
Hemoglobin: 9.9 g/dL — ABNORMAL LOW (ref 12.0–15.0)
MCH: 28.9 pg (ref 26.0–34.0)
MCHC: 29.4 g/dL — ABNORMAL LOW (ref 30.0–36.0)
MCV: 98.5 fL (ref 78.0–100.0)
Platelets: 174 10*3/uL (ref 150–400)
RBC: 3.42 MIL/uL — ABNORMAL LOW (ref 3.87–5.11)
RDW: 14.9 % (ref 11.5–15.5)
WBC: 8.5 10*3/uL (ref 4.0–10.5)

## 2018-04-18 LAB — GLUCOSE, CAPILLARY
GLUCOSE-CAPILLARY: 164 mg/dL — AB (ref 65–99)
GLUCOSE-CAPILLARY: 165 mg/dL — AB (ref 65–99)
Glucose-Capillary: 106 mg/dL — ABNORMAL HIGH (ref 65–99)
Glucose-Capillary: 172 mg/dL — ABNORMAL HIGH (ref 65–99)

## 2018-04-18 MED ORDER — DIPHENHYDRAMINE-ZINC ACETATE 2-0.1 % EX CREA
TOPICAL_CREAM | Freq: Two times a day (BID) | CUTANEOUS | Status: DC | PRN
  Administered 2018-04-18: 15:00:00 via TOPICAL
  Filled 2018-04-18: qty 28

## 2018-04-18 MED ORDER — FUROSEMIDE 10 MG/ML IJ SOLN
40.0000 mg | Freq: Two times a day (BID) | INTRAMUSCULAR | Status: DC
  Administered 2018-04-18 – 2018-04-20 (×5): 40 mg via INTRAVENOUS
  Filled 2018-04-18 (×5): qty 4

## 2018-04-18 MED ORDER — FUROSEMIDE 10 MG/ML IJ SOLN
20.0000 mg | Freq: Once | INTRAMUSCULAR | Status: AC
  Administered 2018-04-18: 20 mg via INTRAVENOUS
  Filled 2018-04-18: qty 2

## 2018-04-18 MED ORDER — SODIUM CHLORIDE 0.9 % IV BOLUS
500.0000 mL | Freq: Once | INTRAVENOUS | Status: AC
  Administered 2018-04-18: 500 mL via INTRAVENOUS

## 2018-04-18 NOTE — Progress Notes (Signed)
PROGRESS NOTE    Kayla Hunt  WPY:[id number] DOB: 1951/05/20 DOA: 04/11/2018 PCP: Reymundo Poll, MD    Brief Narrative:  67 y.o.femalewithhistory of diabetes mellitus type 2, chronic kidney disease stage IV, hypertension, hypothyroidism, gout was referred from the skilled nursing facility after patient potassium was found to be around 6.8. Patient states she was noticed to have lower extremity edema and had lab works done which showed an elevated potassium and was referred to the ER patient denies any chest pain shortness of breath nausea vomiting or diarrhea.  Assessment & Plan:   Principal Problem:   Hyperkalemia Active Problems:   Hypothyroidism   Gout, unspecified   MILD MENTAL RETARDATION   Essential hypertension, benign   CHRONIC KIDNEY DISEASE STAGE III (MODERATE)   Secondary renal hyperparathyroidism (HCC)   Type II diabetes mellitus with renal manifestations ([place])   1. Hyperkalemia, with history of CKD stage IV - improved, today potassium is 4.6-patient came with potassium of 6.8 and was given patiromer as potassium was still elevated at 5.8, she was given Kayexalate 30 g p.o. x1 .  Hyperkalemia likely  being on lisinopril with underlying CKD.  Lisinopril had been on hold.  Patient was initially started on  started on patiromer, which was changed to Kayexalate after one dose, also was given D50 and IV insulin with bicarb.   1. Potassium level has improved 2. Low-grade fever-patient had low-grade fever 100.1 1. CXR clear, reviewed 2. urine culture showed no growth 3. Off abx 4. Checked core temp, noted to be afebrile this afternoon 3. Possible Cellulitis: Patient noted to have right lower extremity was warm to touch, and tender to palpation. Patient was continued on rocephin 4. Pulmonary edema/lower extremity edema-venous duplex of lower extremities is negative for DVT.    1. Patient was initially started on p.o. Lasix, which was later transitioned to Lasix 20 mg IV  every 12 hours. 2. Patient's weight today 136.1, was 138.3 on admit 3. Patient with O2 sats of 88% on standing, improved to 91% at rest, see below 4. Increase lasix to 40mg  IV bid 5. Repeat bmet in AM 5. Hypertension- 1. lisinopril is currently on hold per above. 2. Patent is continued on metoprolol, hydralazine.   6. CKD stage IV-creatinine is around 2 which is at her baseline.   1. Increased lasix per above 2. Repeat bmet in AM 7. Diabetes mellitus-patient had an episode of hypoglycemia in the ED, oral hypoglycemics currently on hold.  Continue sliding scale insulin with NovoLog as needed. 8. Hypothyroidism-continue Synthroid 1. Stable at present 9. Anemia likely from underlying CKD- 1. Remains hemodynamically stable at this time  DVT prophylaxis: Heparin subQ Code Status: Full Family Communication: Pt in room, family not at bedside Disposition Plan: Return to ALF possibly in 1-2 days  Consultants:     Procedures:     Antimicrobials: Anti-infectives (From admission, onward)   None       Subjective: Reports feeling well, no complaints  Objective: Vitals:   04/17/18 2032 04/18/18 0026 04/18/18 0512 04/18/18 1003  BP: (!) 140/103 (!) 145/103 (!) 109/54 (!) 140/59  Pulse: 79 83 84 66  Resp: 18 16 (!) 24 18  Temp: (!) 100.5 F (38.1 C) (!) 102.1 F (38.9 C) 99.7 F (37.6 C) 98.9 F (37.2 C)  TempSrc: Oral Oral Oral Oral  SpO2: 92% 91% 93% 96%  Weight:  (!) 136.1 kg (300 lb 1 oz)    Height:        Intake/Output  Summary (Last 24 hours) at 04/18/2018 1535 Last data filed at 04/18/2018 1531 Gross per 24 hour  Intake 1100 ml  Output 825 ml  Net 275 ml   Filed Weights   04/15/18 2017 04/17/18 0557 04/18/18 0026  Weight: 135.8 kg (299 lb 6.2 oz) (!) 136.1 kg (300 lb 0.7 oz) (!) 136.1 kg (300 lb 1 oz)    Examination:  General exam: Appears calm and comfortable  Respiratory system: Clear to auscultation. Respiratory effort normal. Cardiovascular system: S1  & S2 heard, RRR Gastrointestinal system: Abdomen is nondistended, soft and nontender. No organomegaly or masses felt. Normal bowel sounds heard. Central nervous system: Alert and oriented. No focal neurological deficits. Extremities: Symmetric 5 x 5 power. Skin: No rashes, lesions Psychiatry: Judgement and insight appear normal. Mood & affect appropriate.   Data Reviewed: I have personally reviewed following labs and imaging studies  CBC: Recent Labs  Lab 04/11/18 1827 04/12/18 1337 04/16/18 0643 04/18/18 0123  WBC 4.5 4.1 6.5 8.5  NEUTROABS 2.2  --   --   --   HGB 10.8* 11.0* 11.2* 9.9*  HCT 37.2 38.1 38.9 33.7*  MCV 98.4 98.4 99.7 98.5  PLT 193 212 190 782   Basic Metabolic Panel: Recent Labs  Lab 04/14/18 0540 04/15/18 0822 04/16/18 0643 04/17/18 0510 04/18/18 0123  NA 138 139 140 141 140  K 5.1 4.6 4.9 4.2 4.4  CL 102 102 100* 100* 100*  CO2 29 29 33* 33* 32  GLUCOSE 122* 85 120* 100* 134*  BUN 33* 35* 36* 31* 34*  CREATININE 2.16* 2.17* 2.00* 1.83* 2.01*  CALCIUM 8.5* 8.4* 8.5* 8.5* 8.4*   GFR: Estimated Creatinine Clearance: 39 mL/min (A) (by C-G formula based on SCr of 2.01 mg/dL (H)). Liver Function Tests: Recent Labs  Lab 04/11/18 1827  AST 26  ALT 13*  ALKPHOS 65  BILITOT 0.6  PROT 6.7  ALBUMIN 2.9*   No results for input(s): LIPASE, AMYLASE in the last 168 hours. No results for input(s): AMMONIA in the last 168 hours. Coagulation Profile: No results for input(s): INR, PROTIME in the last 168 hours. Cardiac Enzymes: No results for input(s): CKTOTAL, CKMB, CKMBINDEX, TROPONINI in the last 168 hours. BNP (last 3 results) No results for input(s): PROBNP in the last 8760 hours. HbA1C: No results for input(s): HGBA1C in the last 72 hours. CBG: Recent Labs  Lab 04/17/18 1129 04/17/18 1731 04/17/18 2034 04/18/18 0739 04/18/18 1130  GLUCAP 199* 163* 125* 106* 172*   Lipid Profile: No results for input(s): CHOL, HDL, LDLCALC, TRIG, CHOLHDL,  LDLDIRECT in the last 72 hours. Thyroid Function Tests: No results for input(s): TSH, T4TOTAL, FREET4, T3FREE, THYROIDAB in the last 72 hours. Anemia Panel: No results for input(s): VITAMINB12, FOLATE, FERRITIN, TIBC, IRON, RETICCTPCT in the last 72 hours. Sepsis Labs: No results for input(s): PROCALCITON, LATICACIDVEN in the last 168 hours.  Recent Results (from the past 240 hour(s))  MRSA PCR Screening     Status: None   Collection Time: 04/12/18  7:57 PM  Result Value Ref Range Status   MRSA by PCR NEGATIVE NEGATIVE Final    Comment:        The GeneXpert MRSA Assay (FDA approved for NASAL specimens only), is one component of a comprehensive MRSA colonization surveillance program. It is not intended to diagnose MRSA infection nor to guide or monitor treatment for MRSA infections. Performed at [facility], [place] [address] [place], [place] [address]   Culture, Urine  Status: None   Collection Time: 04/15/18 12:53 PM  Result Value Ref Range Status   Specimen Description URINE, RANDOM  Final   Special Requests NONE  Final   Culture   Final    NO GROWTH Performed at [facility], [address], [place], [place] [address]    Report Status 04/16/2018 FINAL  Final     Radiology Studies: Dg Chest Port 1 View  Result Date: 04/18/2018 CLINICAL DATA:  Fever. EXAM: PORTABLE CHEST 1 VIEW COMPARISON:  Radiographs of April 13, 2018. FINDINGS: Stable cardiomegaly with central pulmonary vascular congestion. No pneumothorax or pleural effusion is noted. No consolidative process is noted. Bony thorax is unremarkable. IMPRESSION: Stable cardiomegaly with central pulmonary vascular congestion. Electronically Signed   By: Marijo Conception, M.D.   On: 04/18/2018 08:17    Scheduled Meds: . allopurinol  100 mg Oral Daily  . dextrose  1 ampule Intravenous Once  . furosemide  20 mg Intravenous Once  . furosemide  40 mg Intravenous Q12H  . heparin  5,000 Units Subcutaneous Q8H  .  hydrALAZINE  25 mg Oral BID  . levothyroxine  112 mcg Oral QAC breakfast  . metoprolol tartrate  50 mg Oral BID  . simvastatin  20 mg Oral Daily   Continuous Infusions:   LOS: 5 days   Marylu Lund, MD [facility] Pager [phone]  If 7PM-7AM, please contact night-coverage [email or web address] Password [email or web address] 04/18/2018, 3:35 PM

## 2018-04-18 NOTE — Progress Notes (Signed)
NP made aware of elevated temperature. PRN given.

## 2018-04-18 NOTE — Progress Notes (Signed)
Patient continues to take oxygen off. Saturation averages to mid 70's to low 80's on room air. Encouraged to keep oxygen on. Patient is alert and oriented x 2-3. Will continue to monitor.

## 2018-04-18 NOTE — Clinical Social Work Note (Signed)
Clinical Social Work Assessment  Patient Details  Name: Kayla Hunt MRN: [id number] Date of Birth: 1951-07-14  Date of referral:  04/18/18               Reason for consult:  Discharge Planning                Permission sought to share information with:  Family Supports Permission granted to share information::  Yes, Verbal Permission Granted  Name::     Phillis Haggis  Agency::     Relationship::  Daughter  Contact Information:  [phone]  Housing/Transportation Living arrangements for the past 2 months:  [place] of Information:  Patient Patient Interpreter Needed:  None Criminal Activity/Legal Involvement Pertinent to Current Situation/Hospitalization:  No - Comment as needed Significant Relationships:  Adult Children, Other Family Members Lives with:  [facility]) Do you feel safe going back to the place where you live?  Yes Need for family participation in patient care:  No (Coment)  Care giving concerns:  Patient expressed no concerns regarding her care at [facility].   Social Worker assessment / plan:  CSW talked with patient at the bedside regarding her discharge disposition. [name] confirmed that she came from [facility] and plans to return. When asked, patient responded that she has been at facility since 2016. [name] was informed that CSW had been unsuccessful in reaching her daughter and provided her with phone number used, and patient indicated that the number is correct. [name] advised CSW that she will call her daughter once she gets back to ALF.   Employment status:  Disabled (Comment on whether or not currently receiving Disability) Insurance information:  Medicaid [place] PT Recommendations:  Not assessed at this time Information / Referral to community resources:  Other (Comment Required)(None requested or needed as patient from ALF)  Patient/Family's Response to  care:  Patient expressed no concerns regarding her care during hospitalization.  Patient/Family's Understanding of and Emotional Response to Diagnosis, Current Treatment, and Prognosis:  Patient did not talk with CSW regarding what brought her to hospital or treatment since admission.    Emotional Assessment Appearance:  Appears stated age Attitude/Demeanor/Rapport:  Engaged Affect (typically observed):  Pleasant, Appropriate Orientation:  Oriented to Self, Oriented to Place, Oriented to Situation Alcohol / Substance use:  Tobacco Use, Alcohol Use, Illicit Drugs(Patient reported that she used to use smokeless tobacco but does not use now, and does not drink or use illicit drugs) Psych involvement (Current and /or in the community):  No (Comment)  Discharge Needs  Concerns to be addressed:  Discharge Planning Concerns Readmission within the last 30 days:  No Current discharge risk:  None Barriers to Discharge:  No Barriers Identified   [name], LCSW 04/18/2018, 3:16 PM

## 2018-04-18 NOTE — NC FL2 (Signed)
[place] LEVEL OF CARE SCREENING TOOL     IDENTIFICATION  Patient Name: Kayla Hunt Birthdate: Oct 16, 1951 Sex: female Admission Date (Current Location): 04/11/2018  [place] and [place] Number:  Kayla Hunt [id number] [place] and Address:  [facility], [place] [address] [place], [place] [address]      Provider Number: [id number]  Attending Physician Name and Address:  Donne Hazel, MD  Relative Name and Phone Number:  Phillis Haggis - daughter, [phone]    Current Level of Care: Hospital Recommended Level of Care: Assisted Living Facility(Patient from [facility]) Prior Approval Number:    Date Approved/Denied:   PASRR Number:    Discharge Plan: Other (Comment)(ALF)    Current Diagnoses: Patient Active Problem List   Diagnosis Date Noted  . Hyperkalemia 04/11/2018  . Type II diabetes mellitus with renal manifestations ([place]) 03/11/2015  . Osteoarthritis of both knees 03/11/2015  . Dyslipidemia 10/02/2007  . MILD MENTAL RETARDATION 10/02/2007  . DEAFNESS, UNILATERAL 10/02/2007  . Secondary renal hyperparathyroidism ([place]) 01/04/2007  . CHRONIC KIDNEY DISEASE STAGE III (MODERATE) 11/27/2004  . Gout, unspecified 01/06/2003  . Hypothyroidism 05/28/1998  . Essential hypertension, benign 04/15/1998    Orientation RESPIRATION BLADDER Height & Weight     Self, Place, Situation  Normal Continent Weight: (!) 300 lb 1 oz (136.1 kg) Height:  5\' 4"  (162.6 cm)  BEHAVIORAL SYMPTOMS/MOOD NEUROLOGICAL BOWEL NUTRITION STATUS      Continent Diet(Regular)  AMBULATORY STATUS COMMUNICATION OF NEEDS Skin   Total Care(Uses a wheelchair) Verbally Normal                       Personal Care Assistance Level of Assistance  Bathing, Feeding, Dressing Bathing Assistance: Limited assistance Feeding assistance: Independent Dressing Assistance: Independent     Functional Limitations Info  Sight, Hearing, Speech Sight Info:  Adequate Hearing Info: Adequate Speech Info: Adequate    SPECIAL CARE FACTORS FREQUENCY                       Contractures Contractures Info: Not present    Additional Factors Info  Code Status, Allergies Code Status Info: Full Allergies Info: No known allergies           Current Medications (04/18/2018):  This is the current hospital active medication list Current Facility-Administered Medications  Medication Dose Route Frequency Provider Last Rate Last Dose  . acetaminophen (TYLENOL) tablet 650 mg  650 mg Oral Q6H PRN Rise Patience, MD   650 mg at 04/18/18 0038   Or  . acetaminophen (TYLENOL) suppository 650 mg  650 mg Rectal Q6H PRN Rise Patience, MD      . allopurinol (ZYLOPRIM) tablet 100 mg  100 mg Oral Daily Rise Patience, MD   100 mg at 04/18/18 0905  . benzonatate (TESSALON) capsule 200 mg  200 mg Oral TID PRN Oswald Hillock, MD   200 mg at 04/17/18 2315  . dextrose 50 % solution 50 mL  1 ampule Intravenous Once Rise Patience, MD      . diphenhydrAMINE-zinc acetate (BENADRYL) 2-0.1 % cream   Topical BID PRN Donne Hazel, MD      . furosemide (LASIX) injection 20 mg  20 mg Intravenous Q12H Oswald Hillock, MD   20 mg at 04/18/18 0553  . heparin injection 5,000 Units  5,000 Units Subcutaneous Q8H Rise Patience, MD   5,000 Units at 04/18/18 0554  .  hydrALAZINE (APRESOLINE) tablet 25 mg  25 mg Oral BID Oswald Hillock, MD   25 mg at 04/18/18 0905  . levothyroxine (SYNTHROID, LEVOTHROID) tablet 112 mcg  112 mcg Oral QAC breakfast Rise Patience, MD   112 mcg at 04/18/18 0905  . metoprolol tartrate (LOPRESSOR) tablet 50 mg  50 mg Oral BID Rise Patience, MD   50 mg at 04/18/18 0905  . ondansetron (ZOFRAN) tablet 4 mg  4 mg Oral Q6H PRN Rise Patience, MD       Or  . ondansetron [facility]) injection 4 mg  4 mg Intravenous Q6H PRN Rise Patience, MD      . simvastatin (ZOCOR) tablet 20 mg  20 mg Oral Daily Rise Patience, MD   20 mg at 04/18/18 4680     Discharge Medications: Please see discharge summary for a list of discharge medications.  Relevant Imaging Results:  Relevant Lab Results:   Additional Information [id number].  DISCHARGE MEDICATIONS:  Medication List    STOP taking these medications   colchicine 0.6 MG tablet   lisinopril 40 MG tablet Commonly known as:  PRINIVIL,ZESTRIL     TAKE these medications   allopurinol 100 MG tablet Commonly known as:  ZYLOPRIM Take 100 mg by mouth daily.   cephALEXin 500 MG capsule Commonly known as:  KEFLEX Take 1 capsule (500 mg total) by mouth every 12 (twelve) hours for 5 days.   furosemide 40 MG tablet Commonly known as:  LASIX Take 1 tablet (40 mg total) by mouth daily. What changed:    medication strength  how much to take   glipiZIDE 5 MG tablet Commonly known as:  GLUCOTROL Take 5 mg by mouth 2 (two) times daily before a meal.   guaiFENesin-dextromethorphan 100-10 MG/5ML syrup Commonly known as:  ROBITUSSIN DM Take 10 mLs by mouth every 4 (four) hours as needed for cough.   hydrALAZINE 50 MG tablet Commonly known as:  APRESOLINE Take 50 mg by mouth 2 (two) times daily.   levothyroxine 112 MCG tablet Commonly known as:  SYNTHROID, LEVOTHROID Take 112 mcg by mouth daily before breakfast.   loratadine 10 MG tablet Commonly known as:  CLARITIN Take 10 mg by mouth daily as needed for allergies.   metoprolol tartrate 50 MG tablet Commonly known as:  LOPRESSOR Take 50 mg by mouth 2 (two) times daily.   simvastatin 20 MG tablet Commonly known as:  ZOCOR Take 20 mg by mouth daily.         [name], LCSW

## 2018-04-18 NOTE — Clinical Social Work Note (Signed)
CSW advised by nurse (3:33 pm) and then later by MD that patient will not discharge today. Per Dr. Wyline Copas, he will be giving patient more lasix due to edema. Call made to [facility] and med tech [place] informed that patient won't discharge today. CSW will continue to follow and facilitate discharge back to ALF once medically stable.  Loryn Haacke Givens, MSW, LCSW Licensed Clinical Social Worker [place] [phone]

## 2018-04-19 DIAGNOSIS — N183 Chronic kidney disease, stage 3 (moderate): Secondary | ICD-10-CM

## 2018-04-19 LAB — BASIC METABOLIC PANEL
Anion gap: 11 (ref 5–15)
BUN: 32 mg/dL — ABNORMAL HIGH (ref 6–20)
CO2: 31 mmol/L (ref 22–32)
Calcium: 8.7 mg/dL — ABNORMAL LOW (ref 8.9–10.3)
Chloride: 100 mmol/L — ABNORMAL LOW (ref 101–111)
Creatinine, Ser: 1.69 mg/dL — ABNORMAL HIGH (ref 0.44–1.00)
GFR calc Af Amer: 36 mL/min — ABNORMAL LOW (ref >60)
GFR, EST NON AFRICAN AMERICAN: 31 mL/min — AB (ref >60)
GLUCOSE: 100 mg/dL — AB (ref 65–99)
POTASSIUM: 4.5 mmol/L (ref 3.5–5.1)
SODIUM: 142 mmol/L (ref 135–145)

## 2018-04-19 LAB — GLUCOSE, CAPILLARY
GLUCOSE-CAPILLARY: 106 mg/dL — AB (ref 65–99)
GLUCOSE-CAPILLARY: 123 mg/dL — AB (ref 65–99)
GLUCOSE-CAPILLARY: 159 mg/dL — AB (ref 65–99)
GLUCOSE-CAPILLARY: 190 mg/dL — AB (ref 65–99)

## 2018-04-19 MED ORDER — VANCOMYCIN HCL 10 G IV SOLR: 1750.0000 mg | INTRAVENOUS | Status: DC

## 2018-04-19 MED ORDER — LACTULOSE 10 GM/15ML PO SOLN
10.0000 g | Freq: Two times a day (BID) | ORAL | Status: DC | PRN
  Administered 2018-04-19 – 2018-04-20 (×2): 10 g via ORAL
  Filled 2018-04-19 (×3): qty 15

## 2018-04-19 MED ORDER — POLYETHYLENE GLYCOL 3350 17 G PO PACK
17.0000 g | PACK | Freq: Every day | ORAL | Status: DC
  Administered 2018-04-19 – 2018-04-23 (×5): 17 g via ORAL
  Filled 2018-04-19 (×5): qty 1

## 2018-04-19 MED ORDER — VANCOMYCIN HCL 10 G IV SOLR
2000.0000 mg | Freq: Once | INTRAVENOUS | Status: AC
  Administered 2018-04-19: 2000 mg via INTRAVENOUS
  Filled 2018-04-19 (×2): qty 2000

## 2018-04-19 MED ORDER — HYDRALAZINE HCL 20 MG/ML IJ SOLN: 5.0000 mg | INTRAMUSCULAR | Status: DC | PRN

## 2018-04-19 NOTE — Progress Notes (Signed)
PROGRESS NOTE    XOE HOE  WVP:[id number] DOB: October 15, 1951 DOA: 04/11/2018 PCP: Reymundo Poll, MD    Brief Narrative:  67 y.o.femalewithhistory of diabetes mellitus type 2, chronic kidney disease stage IV, hypertension, hypothyroidism, gout was referred from the skilled nursing facility after patient potassium was found to be around 6.8. Patient states she was noticed to have lower extremity edema and had lab works done which showed an elevated potassium and was referred to the ER patient denies any chest pain shortness of breath nausea vomiting or diarrhea.  Assessment & Plan:   Principal Problem:   Hyperkalemia Active Problems:   Hypothyroidism   Gout, unspecified   MILD MENTAL RETARDATION   Essential hypertension, benign   CHRONIC KIDNEY DISEASE STAGE III (MODERATE)   Secondary renal hyperparathyroidism (HCC)   Type II diabetes mellitus with renal manifestations ([place])   1. Hyperkalemia, with history of CKD stage IV - improved, today potassium is 4.6-patient came with potassium of 6.8 and was given patiromer as potassium was still elevated at 5.8, she was given Kayexalate 30 g p.o. x1 .  Hyperkalemia suspected to be related to outpatient lisinopril with underlying CKD.  Lisinopril is been stopped.  Patient was initially started on  started on patiromer, which was changed to Kayexalate after one dose, also was given D50 and IV insulin with bicarb.   1. Potassium has since normalized 2. We will repeat basic metabolic panel in the morning 2. Patient noted to have recent low-grade temperature taken orally, of around 100 F. 1. CXR clear, reviewed 2. urine culture showed no growth 3. Patient had remained off antibiotics. 4. Cultures are positive for 1 out of 2 coag negative staph species, likely contaminant. 5. Of note, core temperature/rectal temperature was checked and patient was noted to be afebrile. 3. Possible Cellulitis: Patient noted to have right lower extremity was  warm to touch, and tender to palpation.  Patient had been treated with a course of Rocephin.  No significant erythema appreciated at this time. 4. Pulmonary edema/lower extremity edema-venous duplex of lower extremities is negative for DVT.    1. Patient was initially started on p.o. Lasix, which was later transitioned to Lasix 20 mg IV every 12 hours with no significant drop in weight.  Lasix dose was increased to 40 mg IV every 12 hours with very good urine output overnight. 2. Weight today noted be 131 kg (weight was reported to be 36 kg yesterday). 3. To void well with current dose of Lasix, will continue. 4. Repeat basic metabolic panel in the morning 5. Hypertension- 1. lisinopril is currently on hold per above. 2. Patent is continued on metoprolol, hydralazine.  3. Blood pressure trends reviewed, controlled  6. CKD stage IV-creatinine is around 2 which is at her baseline.   1. Increased lasix per above 2. Renal function is improving on increased dose of Lasix 3. Repeat basic metabolic panel in the morning 7. Diabetes mellitus-patient had an episode of hypoglycemia in the ED, oral hypoglycemics currently on hold.  Continue sliding scale insulin with NovoLog as needed. 1. Glucose trends reviewed, relatively controlled 8. Hypothyroidism-continue Synthroid 1. Stable at present 9. Anemia likely from underlying CKD- 1. Remains hemodynamically stable at this time  DVT prophylaxis: Heparin subQ Code Status: Full Family Communication: Pt in room, family not at bedside Disposition Plan: Return to ALF when fully diuresed  Consultants:     Procedures:     Antimicrobials: Anti-infectives (From admission, onward)   Start  Dose/Rate Route Frequency Ordered Stop   04/21/18 0600  vancomycin (VANCOCIN) 1,750 mg in sodium chloride 0.9 % 500 mL IVPB  Status:  Discontinued     1,750 mg 250 mL/hr over 120 Minutes Intravenous Every 48 hours 04/19/18 0028 04/19/18 1410   04/19/18 0030   vancomycin (VANCOCIN) 2,000 mg in sodium chloride 0.9 % 500 mL IVPB     2,000 mg 250 mL/hr over 120 Minutes Intravenous  Once 04/19/18 0028 04/19/18 0501      Subjective: Patient eager to return to assisted living.  Denies chest pains.  Reports breathing better today.  Objective: Vitals:   04/18/18 1746 04/18/18 2054 04/19/18 0449 04/19/18 0835  BP: (!) 143/59 (!) 149/47 (!) 92/52 (!) 142/65  Pulse: 63 67 70 70  Resp: 20 18 (!) 24 (!) 25  Temp: 99.1 F (37.3 C) 99 F (37.2 C) 98.9 F (37.2 C) 98.5 F (36.9 C)  TempSrc: Oral Oral Oral Oral  SpO2: 95% 96% 99% 97%  Weight:  131.3 kg (289 lb 7.4 oz)    Height:        Intake/Output Summary (Last 24 hours) at 04/19/2018 1540 Last data filed at 04/19/2018 1529 Gross per 24 hour  Intake 1000 ml  Output 2175 ml  Net -1175 ml   Filed Weights   04/17/18 0557 04/18/18 0026 04/18/18 2054  Weight: (!) 136.1 kg (300 lb 0.7 oz) (!) 136.1 kg (300 lb 1 oz) 131.3 kg (289 lb 7.4 oz)    Examination: General exam: Awake, laying in bed, in nad Respiratory system: Normal respiratory effort, no wheezing Cardiovascular system: regular rate, s1, s2 Gastrointestinal system: Soft, nondistended, positive BS Central nervous system: CN2-12 grossly intact, strength intact Extremities: Perfused, no clubbing Skin: Normal skin turgor, no notable skin lesions seen Psychiatry: Mood normal // no visual hallucinations    Data Reviewed: I have personally reviewed following labs and imaging studies  CBC: Recent Labs  Lab 04/16/18 0643 04/18/18 0123  WBC 6.5 8.5  HGB 11.2* 9.9*  HCT 38.9 33.7*  MCV 99.7 98.5  PLT 190 474   Basic Metabolic Panel: Recent Labs  Lab 04/15/18 0822 04/16/18 0643 04/17/18 0510 04/18/18 0123 04/19/18 0606  NA 139 140 141 140 142  K 4.6 4.9 4.2 4.4 4.5  CL 102 100* 100* 100* 100*  CO2 29 33* 33* 32 31  GLUCOSE 85 120* 100* 134* 100*  BUN 35* 36* 31* 34* 32*  CREATININE 2.17* 2.00* 1.83* 2.01* 1.69*  CALCIUM  8.4* 8.5* 8.5* 8.4* 8.7*   GFR: Estimated Creatinine Clearance: 45.3 mL/min (A) (by C-G formula based on SCr of 1.69 mg/dL (H)). Liver Function Tests: No results for input(s): AST, ALT, ALKPHOS, BILITOT, PROT, ALBUMIN in the last 168 hours. No results for input(s): LIPASE, AMYLASE in the last 168 hours. No results for input(s): AMMONIA in the last 168 hours. Coagulation Profile: No results for input(s): INR, PROTIME in the last 168 hours. Cardiac Enzymes: No results for input(s): CKTOTAL, CKMB, CKMBINDEX, TROPONINI in the last 168 hours. BNP (last 3 results) No results for input(s): PROBNP in the last 8760 hours. HbA1C: No results for input(s): HGBA1C in the last 72 hours. CBG: Recent Labs  Lab 04/18/18 1130 04/18/18 1648 04/18/18 2058 04/19/18 0732 04/19/18 1200  GLUCAP 172* 164* 165* 106* 123*   Lipid Profile: No results for input(s): CHOL, HDL, LDLCALC, TRIG, CHOLHDL, LDLDIRECT in the last 72 hours. Thyroid Function Tests: No results for input(s): TSH, T4TOTAL, FREET4, T3FREE, THYROIDAB in the last  72 hours. Anemia Panel: No results for input(s): VITAMINB12, FOLATE, FERRITIN, TIBC, IRON, RETICCTPCT in the last 72 hours. Sepsis Labs: No results for input(s): PROCALCITON, LATICACIDVEN in the last 168 hours.  Recent Results (from the past 240 hour(s))  MRSA PCR Screening     Status: None   Collection Time: 04/12/18  7:57 PM  Result Value Ref Range Status   MRSA by PCR NEGATIVE NEGATIVE Final    Comment:        The GeneXpert MRSA Assay (FDA approved for NASAL specimens only), is one component of a comprehensive MRSA colonization surveillance program. It is not intended to diagnose MRSA infection nor to guide or monitor treatment for MRSA infections. Performed at [facility], [place] [address], [place], [place] [address]   Culture, Urine     Status: None   Collection Time: 04/15/18 12:53 PM  Result Value Ref Range Status   Specimen Description URINE, RANDOM   Final   Special Requests NONE  Final   Culture   Final    NO GROWTH Performed at [facility], [place] [address], [place], [place] [address]    Report Status 04/16/2018 FINAL  Final  Culture, blood (routine x 2)     Status: None (Preliminary result)   Collection Time: 04/18/18  1:24 AM  Result Value Ref Range Status   Specimen Description BLOOD RIGHT ANTECUBITAL  Final   Special Requests   Final    BOTTLES DRAWN AEROBIC AND ANAEROBIC Blood Culture adequate volume   Culture   Final    NO GROWTH 1 DAY Performed at [facility], [place] [address], [place], [place] [address]    Report Status PENDING  Incomplete  Culture, blood (routine x 2)     Status: Abnormal (Preliminary result)   Collection Time: 04/18/18  1:26 AM  Result Value Ref Range Status   Specimen Description BLOOD RIGHT HAND  Final   Special Requests   Final    BOTTLES DRAWN AEROBIC AND ANAEROBIC Blood Culture adequate volume   Culture  Setup Time   Final    GRAM POSITIVE COCCI ANAEROBIC BOTTLE ONLY CRITICAL RESULT CALLED TO, READ BACK BY AND VERIFIED WITH: Karsten Ro [facility] 04/18/18 2353 JDW Performed at [facility], [place] [address], [place], [place] [address]    Culture STAPHYLOCOCCUS SPECIES (COAGULASE NEGATIVE) (A)  Final   Report Status PENDING  Incomplete  Blood Culture ID Panel (Reflexed)     Status: Abnormal   Collection Time: 04/18/18  1:26 AM  Result Value Ref Range Status   Enterococcus species NOT DETECTED NOT DETECTED Final   Listeria monocytogenes NOT DETECTED NOT DETECTED Final   Staphylococcus species DETECTED (A) NOT DETECTED Final    Comment: Methicillin (oxacillin) resistant coagulase negative staphylococcus. Possible blood culture contaminant (unless isolated from more than one blood culture draw or clinical case suggests pathogenicity). No antibiotic treatment is indicated for blood  culture contaminants. CRITICAL RESULT CALLED TO, READ BACK BY AND VERIFIED WITH: J LEDFORD [facility]  04/18/18 2353 JDW    Staphylococcus aureus NOT DETECTED NOT DETECTED Final   Methicillin resistance DETECTED (A) NOT DETECTED Final    Comment: CRITICAL RESULT CALLED TO, READ BACK BY AND VERIFIED WITH: J LEDFORD [facility] 04/18/18 2353 JDW    Streptococcus species NOT DETECTED NOT DETECTED Final   Streptococcus agalactiae NOT DETECTED NOT DETECTED Final   Streptococcus pneumoniae NOT DETECTED NOT DETECTED Final   Streptococcus pyogenes NOT DETECTED NOT DETECTED Final  Acinetobacter baumannii NOT DETECTED NOT DETECTED Final   Enterobacteriaceae species NOT DETECTED NOT DETECTED Final   Enterobacter cloacae complex NOT DETECTED NOT DETECTED Final   Escherichia coli NOT DETECTED NOT DETECTED Final   Klebsiella oxytoca NOT DETECTED NOT DETECTED Final   Klebsiella pneumoniae NOT DETECTED NOT DETECTED Final   Proteus species NOT DETECTED NOT DETECTED Final   Serratia marcescens NOT DETECTED NOT DETECTED Final   Haemophilus influenzae NOT DETECTED NOT DETECTED Final   Neisseria meningitidis NOT DETECTED NOT DETECTED Final   Pseudomonas aeruginosa NOT DETECTED NOT DETECTED Final   Candida albicans NOT DETECTED NOT DETECTED Final   Candida glabrata NOT DETECTED NOT DETECTED Final   Candida krusei NOT DETECTED NOT DETECTED Final   Candida parapsilosis NOT DETECTED NOT DETECTED Final   Candida tropicalis NOT DETECTED NOT DETECTED Final    Comment: Performed at [facility], [place] [address], [place], [place] [address]     Radiology Studies: Dg Chest Port 1 View  Result Date: 04/18/2018 CLINICAL DATA:  Fever. EXAM: PORTABLE CHEST 1 VIEW COMPARISON:  Radiographs of April 13, 2018. FINDINGS: Stable cardiomegaly with central pulmonary vascular congestion. No pneumothorax or pleural effusion is noted. No consolidative process is noted. Bony thorax is unremarkable. IMPRESSION: Stable cardiomegaly with central pulmonary vascular congestion. Electronically Signed   By: Marijo Conception, M.D.   On:  04/18/2018 08:17    Scheduled Meds: . allopurinol  100 mg Oral Daily  . dextrose  1 ampule Intravenous Once  . furosemide  40 mg Intravenous Q12H  . heparin  5,000 Units Subcutaneous Q8H  . hydrALAZINE  25 mg Oral BID  . levothyroxine  112 mcg Oral QAC breakfast  . metoprolol tartrate  50 mg Oral BID  . simvastatin  20 mg Oral Daily   Continuous Infusions:   LOS: 6 days   Marylu Lund, MD [facility] Pager [phone]  If 7PM-7AM, please contact night-coverage [email or web address] Password [facility] 04/19/2018, 3:40 PM

## 2018-04-19 NOTE — Progress Notes (Signed)
PHARMACY - PHYSICIAN COMMUNICATION CRITICAL VALUE ALERT - BLOOD CULTURE IDENTIFICATION (BCID)  Kayla Hunt is an 67 y.o. female who presented to [facility] on 04/11/2018 with a chief complaint of hyperkalemia.   Assessment:  Pt has had on and off low grade fever, also some question of cellulitis  Name of physician (or Provider) Contacted: K Schorr ([facility])  Current antibiotics: None  Changes to prescribed antibiotics recommended:  Start vancomycin per pharmacy -Vancomycin 2000 mg IV x 1, then give 1750 mg IV q48h -Trend WBC, temp, renal function  -F/U infectious work-up -Drug levels as indicated   Results for orders placed or performed during the hospital encounter of 04/11/18  Blood Culture ID Panel (Reflexed) (Collected: 04/18/2018  1:26 AM)  Result Value Ref Range   Enterococcus species NOT DETECTED NOT DETECTED   Listeria monocytogenes NOT DETECTED NOT DETECTED   Staphylococcus species DETECTED (A) NOT DETECTED   Staphylococcus aureus NOT DETECTED NOT DETECTED   Methicillin resistance DETECTED (A) NOT DETECTED   Streptococcus species NOT DETECTED NOT DETECTED   Streptococcus agalactiae NOT DETECTED NOT DETECTED   Streptococcus pneumoniae NOT DETECTED NOT DETECTED   Streptococcus pyogenes NOT DETECTED NOT DETECTED   Acinetobacter baumannii NOT DETECTED NOT DETECTED   Enterobacteriaceae species NOT DETECTED NOT DETECTED   Enterobacter cloacae complex NOT DETECTED NOT DETECTED   Escherichia coli NOT DETECTED NOT DETECTED   Klebsiella oxytoca NOT DETECTED NOT DETECTED   Klebsiella pneumoniae NOT DETECTED NOT DETECTED   Proteus species NOT DETECTED NOT DETECTED   Serratia marcescens NOT DETECTED NOT DETECTED   Haemophilus influenzae NOT DETECTED NOT DETECTED   Neisseria meningitidis NOT DETECTED NOT DETECTED   Pseudomonas aeruginosa NOT DETECTED NOT DETECTED   Candida albicans NOT DETECTED NOT DETECTED   Candida glabrata NOT DETECTED NOT DETECTED   Candida krusei NOT  DETECTED NOT DETECTED   Candida parapsilosis NOT DETECTED NOT DETECTED   Candida tropicalis NOT DETECTED NOT DETECTED    Narda Bonds 04/19/2018  12:02 AM

## 2018-04-20 LAB — BASIC METABOLIC PANEL
Anion gap: 8 (ref 5–15)
BUN: 31 mg/dL — ABNORMAL HIGH (ref 6–20)
CALCIUM: 8.4 mg/dL — AB (ref 8.9–10.3)
CHLORIDE: 95 mmol/L — AB (ref 101–111)
CO2: 39 mmol/L — AB (ref 22–32)
CREATININE: 1.68 mg/dL — AB (ref 0.44–1.00)
GFR calc non Af Amer: 31 mL/min — ABNORMAL LOW (ref >60)
GFR, EST AFRICAN AMERICAN: 36 mL/min — AB (ref >60)
Glucose, Bld: 116 mg/dL — ABNORMAL HIGH (ref 65–99)
Potassium: 3.7 mmol/L (ref 3.5–5.1)
SODIUM: 142 mmol/L (ref 135–145)

## 2018-04-20 LAB — GLUCOSE, CAPILLARY
GLUCOSE-CAPILLARY: 145 mg/dL — AB (ref 65–99)
Glucose-Capillary: 107 mg/dL — ABNORMAL HIGH (ref 65–99)
Glucose-Capillary: 194 mg/dL — ABNORMAL HIGH (ref 65–99)
Glucose-Capillary: 199 mg/dL — ABNORMAL HIGH (ref 65–99)

## 2018-04-20 LAB — CBC
HCT: 33.8 % — ABNORMAL LOW (ref 36.0–46.0)
Hemoglobin: 10 g/dL — ABNORMAL LOW (ref 12.0–15.0)
MCH: 28.2 pg (ref 26.0–34.0)
MCHC: 29.6 g/dL — ABNORMAL LOW (ref 30.0–36.0)
MCV: 95.2 fL (ref 78.0–100.0)
PLATELETS: 210 10*3/uL (ref 150–400)
RBC: 3.55 MIL/uL — ABNORMAL LOW (ref 3.87–5.11)
RDW: 14.6 % (ref 11.5–15.5)
WBC: 6.6 10*3/uL (ref 4.0–10.5)

## 2018-04-20 MED ORDER — FUROSEMIDE 10 MG/ML IJ SOLN: 60.0000 mg | Freq: Two times a day (BID) | INTRAMUSCULAR | Status: DC

## 2018-04-20 MED ORDER — FUROSEMIDE 10 MG/ML IJ SOLN
60.0000 mg | Freq: Two times a day (BID) | INTRAMUSCULAR | Status: DC
  Administered 2018-04-21 – 2018-04-23 (×5): 60 mg via INTRAVENOUS
  Filled 2018-04-20 (×7): qty 6

## 2018-04-20 MED ORDER — FUROSEMIDE 10 MG/ML IJ SOLN
20.0000 mg | Freq: Once | INTRAMUSCULAR | Status: AC
  Administered 2018-04-20: 20 mg via INTRAVENOUS

## 2018-04-20 NOTE — Clinical Social Work Note (Addendum)
CSW continuing to follow patient's progress. MD note reviewed and indicates, "Return to ALF when fully diuresed". Handoff completed for weekend CSW and if no d/c over weekend will continue to follow until patient is medically stable for discharge back to [facility].  Vane Yapp Givens, MSW, LCSW Licensed Clinical Social Worker [place] [phone]

## 2018-04-20 NOTE — Progress Notes (Signed)
PROGRESS NOTE    Kayla Hunt  FHL:[id number] DOB: 1951/02/19 DOA: 04/11/2018 PCP: Reymundo Poll, MD    Brief Narrative:  67 y.o.femalewithhistory of diabetes mellitus type 2, chronic kidney disease stage IV, hypertension, hypothyroidism, gout was referred from the skilled nursing facility after patient potassium was found to be around 6.8. Patient states she was noticed to have lower extremity edema and had lab works done which showed an elevated potassium and was referred to the ER patient denies any chest pain shortness of breath nausea vomiting or diarrhea.  Assessment & Plan:   Principal Problem:   Hyperkalemia Active Problems:   Hypothyroidism   Gout, unspecified   MILD MENTAL RETARDATION   Essential hypertension, benign   CHRONIC KIDNEY DISEASE STAGE III (MODERATE)   Secondary renal hyperparathyroidism (HCC)   Type II diabetes mellitus with renal manifestations ([place])   1. Hyperkalemia, with history of CKD stage IV - improved, today potassium is 4.6-patient came with potassium of 6.8 and was given patiromer as potassium was still elevated at 5.8, she was given Kayexalate 30 g p.o. x1 .  Hyperkalemia suspected to be related to outpatient lisinopril with underlying CKD.  Lisinopril is been stopped.  Patient was initially started on  started on patiromer, which was changed to Kayexalate after one dose, also was given D50 and IV insulin with bicarb.   1. Potassium has since normalized 2. Recheck bmet in AM 2. Patient noted to have recent low-grade temperature taken orally, of around 100 F. 1. CXR clear, reviewed 2. urine culture showed no growth 3. Patient had remained off antibiotics. 4. Cultures are positive for 1 out of 2 coag negative staph species, likely contaminant. 5. Of note, core temperature/rectal temperature was recently checked and patient was noted to be afebrile. 6. Stable at present 3. Possible Cellulitis: Patient noted to have right lower extremity was  warm to touch, and tender to palpation.  Patient had been treated with a course of Rocephin.  No significant erythema appreciated at this time. 1. Remains afebrile with no leukocytosis 4. Pulmonary edema/lower extremity edema-venous duplex of lower extremities is negative for DVT.    1. Patient was initially started on p.o. Lasix, which was later transitioned to Lasix 20 mg IV every 12 hours with no significant drop in weight.  Lasix dose was increased to 40 mg IV every 12 hours with very good urine output thus far 2. Weight today remains around 131 kg (weight was reported to be 136 kg yesterday). 3. Will increase lasix to 60mg  bid 4. Repeat bmet in AM 5. Hypertension- 1. lisinopril is currently on hold per above. 2. Patent is continued on metoprolol, hydralazine.  3. Blood pressure trends reviewed,remains stable at this time 6. CKD stage IV-creatinine is around 2 which is at her baseline.   1. Increased lasix per above 2. Renal function is improving on increased dose of Lasix 3. Recheck bmet in AM 7. Diabetes mellitus-patient had an episode of hypoglycemia in the ED, oral hypoglycemics currently on hold.  Will continue with SSI coverage as needed 1. Glucose trends reviewed, relatively controlled 8. Hypothyroidism-continue Synthroid 1. Stable currently 9. Anemia likely from underlying CKD- 1. Remains hemodynamically presently  DVT prophylaxis: Heparin subQ Code Status: Full Family Communication: Pt in room, family not at bedside Disposition Plan: Return to ALF when fully diuresed  Consultants:     Procedures:     Antimicrobials: Anti-infectives (From admission, onward)   Start     Dose/Rate Route Frequency Ordered Stop  04/21/18 0600  vancomycin (VANCOCIN) 1,750 mg in sodium chloride 0.9 % 500 mL IVPB  Status:  Discontinued     1,750 mg 250 mL/hr over 120 Minutes Intravenous Every 48 hours 04/19/18 0028 04/19/18 1410   04/19/18 0030  vancomycin (VANCOCIN) 2,000 mg in sodium  chloride 0.9 % 500 mL IVPB     2,000 mg 250 mL/hr over 120 Minutes Intravenous  Once 04/19/18 0028 04/19/18 0501      Subjective: Patient eager to return to assisted living.  Denies chest pains.  Reports breathing better today.  Objective: Vitals:   04/19/18 2039 04/20/18 0600 04/20/18 0922 04/20/18 1601  BP: (!) 139/56 (!) 141/63 (!) 136/59 (!) 146/58  Pulse: 68 75 70 68  Resp: 18 18 20 18   Temp: 99.1 F (37.3 C) 98 F (36.7 C) 98.7 F (37.1 C) 100.3 F (37.9 C)  TempSrc: Oral Oral Oral Oral  SpO2: 100% 100% 99% 97%  Weight: 131.1 kg (289 lb)     Height:        Intake/Output Summary (Last 24 hours) at 04/20/2018 1648 Last data filed at 04/20/2018 1111 Gross per 24 hour  Intake 240 ml  Output 1200 ml  Net -960 ml   Filed Weights   04/18/18 0026 04/18/18 2054 04/19/18 2039  Weight: (!) 136.1 kg (300 lb 1 oz) 131.3 kg (289 lb 7.4 oz) 131.1 kg (289 lb)    Examination: General exam: Conversant, in no acute distress Respiratory system: normal chest rise, clear, no audible wheezing Cardiovascular system: regular rhythm, s1-s2 Gastrointestinal system: Nondistended, nontender, pos BS Central nervous system: No seizures, no tremors Extremities: No cyanosis, no joint deformities Skin: No rashes, no pallor Psychiatry: Affect normal // no auditory hallucinations   Data Reviewed: I have personally reviewed following labs and imaging studies  CBC: Recent Labs  Lab 04/16/18 0643 04/18/18 0123 04/20/18 0725  WBC 6.5 8.5 6.6  HGB 11.2* 9.9* 10.0*  HCT 38.9 33.7* 33.8*  MCV 99.7 98.5 95.2  PLT 190 174 993   Basic Metabolic Panel: Recent Labs  Lab 04/16/18 0643 04/17/18 0510 04/18/18 0123 04/19/18 0606 04/20/18 0725  NA 140 141 140 142 142  K 4.9 4.2 4.4 4.5 3.7  CL 100* 100* 100* 100* 95*  CO2 33* 33* 32 31 39*  GLUCOSE 120* 100* 134* 100* 116*  BUN 36* 31* 34* 32* 31*  CREATININE 2.00* 1.83* 2.01* 1.69* 1.68*  CALCIUM 8.5* 8.5* 8.4* 8.7* 8.4*    GFR: Estimated Creatinine Clearance: 45.6 mL/min (A) (by C-G formula based on SCr of 1.68 mg/dL (H)). Liver Function Tests: No results for input(s): AST, ALT, ALKPHOS, BILITOT, PROT, ALBUMIN in the last 168 hours. No results for input(s): LIPASE, AMYLASE in the last 168 hours. No results for input(s): AMMONIA in the last 168 hours. Coagulation Profile: No results for input(s): INR, PROTIME in the last 168 hours. Cardiac Enzymes: No results for input(s): CKTOTAL, CKMB, CKMBINDEX, TROPONINI in the last 168 hours. BNP (last 3 results) No results for input(s): PROBNP in the last 8760 hours. HbA1C: No results for input(s): HGBA1C in the last 72 hours. CBG: Recent Labs  Lab 04/19/18 1701 04/19/18 2038 04/20/18 0746 04/20/18 1137 04/20/18 1635  GLUCAP 159* 190* 107* 145* 194*   Lipid Profile: No results for input(s): CHOL, HDL, LDLCALC, TRIG, CHOLHDL, LDLDIRECT in the last 72 hours. Thyroid Function Tests: No results for input(s): TSH, T4TOTAL, FREET4, T3FREE, THYROIDAB in the last 72 hours. Anemia Panel: No results for input(s): VITAMINB12, FOLATE, FERRITIN,  TIBC, IRON, RETICCTPCT in the last 72 hours. Sepsis Labs: No results for input(s): PROCALCITON, LATICACIDVEN in the last 168 hours.  Recent Results (from the past 240 hour(s))  MRSA PCR Screening     Status: None   Collection Time: 04/12/18  7:57 PM  Result Value Ref Range Status   MRSA by PCR NEGATIVE NEGATIVE Final    Comment:        The GeneXpert MRSA Assay (FDA approved for NASAL specimens only), is one component of a comprehensive MRSA colonization surveillance program. It is not intended to diagnose MRSA infection nor to guide or monitor treatment for MRSA infections. Performed at [facility], [place] [address] [place], [place] [address]   Culture, Urine     Status: None   Collection Time: 04/15/18 12:53 PM  Result Value Ref Range Status   Specimen Description URINE, RANDOM  Final   Special Requests  NONE  Final   Culture   Final    NO GROWTH Performed at [facility], [place] [address], [place], [place] [address]    Report Status 04/16/2018 FINAL  Final  Culture, blood (routine x 2)     Status: None (Preliminary result)   Collection Time: 04/18/18  1:24 AM  Result Value Ref Range Status   Specimen Description BLOOD RIGHT ANTECUBITAL  Final   Special Requests   Final    BOTTLES DRAWN AEROBIC AND ANAEROBIC Blood Culture adequate volume   Culture   Final    NO GROWTH 2 DAYS Performed at [facility], [place] [address], [place], [place] [address]    Report Status PENDING  Incomplete  Culture, blood (routine x 2)     Status: Abnormal (Preliminary result)   Collection Time: 04/18/18  1:26 AM  Result Value Ref Range Status   Specimen Description BLOOD RIGHT HAND  Final   Special Requests   Final    BOTTLES DRAWN AEROBIC AND ANAEROBIC Blood Culture adequate volume   Culture  Setup Time   Final    GRAM POSITIVE COCCI ANAEROBIC BOTTLE ONLY CRITICAL RESULT CALLED TO, READ BACK BY AND VERIFIED WITH: J LEDFORD [facility] 04/18/18 2353 JDW    Culture (A)  Final    STAPHYLOCOCCUS SPECIES (COAGULASE NEGATIVE) THE SIGNIFICANCE OF ISOLATING THIS ORGANISM FROM A SINGLE SET OF BLOOD CULTURES WHEN MULTIPLE SETS ARE DRAWN IS UNCERTAIN. PLEASE NOTIFY THE MICROBIOLOGY DEPARTMENT WITHIN ONE WEEK IF SPECIATION AND SENSITIVITIES ARE REQUIRED. Performed at [facility], [place] [address] [place], [place] [address]    Report Status PENDING  Incomplete  Blood Culture ID Panel (Reflexed)     Status: Abnormal   Collection Time: 04/18/18  1:26 AM  Result Value Ref Range Status   Enterococcus species NOT DETECTED NOT DETECTED Final   Listeria monocytogenes NOT DETECTED NOT DETECTED Final   Staphylococcus species DETECTED (A) NOT DETECTED Final    Comment: Methicillin (oxacillin) resistant coagulase negative staphylococcus. Possible blood culture contaminant (unless isolated from more than one blood  culture draw or clinical case suggests pathogenicity). No antibiotic treatment is indicated for blood  culture contaminants. CRITICAL RESULT CALLED TO, READ BACK BY AND VERIFIED WITH: J LEDFORD [facility] 04/18/18 2353 JDW    Staphylococcus aureus NOT DETECTED NOT DETECTED Final   Methicillin resistance DETECTED (A) NOT DETECTED Final    Comment: CRITICAL RESULT CALLED TO, READ BACK BY AND VERIFIED WITH: J LEDFORD [facility] 04/18/18 2353 JDW    Streptococcus species NOT DETECTED NOT DETECTED Final  Streptococcus agalactiae NOT DETECTED NOT DETECTED Final   Streptococcus pneumoniae NOT DETECTED NOT DETECTED Final   Streptococcus pyogenes NOT DETECTED NOT DETECTED Final   Acinetobacter baumannii NOT DETECTED NOT DETECTED Final   Enterobacteriaceae species NOT DETECTED NOT DETECTED Final   Enterobacter cloacae complex NOT DETECTED NOT DETECTED Final   Escherichia coli NOT DETECTED NOT DETECTED Final   Klebsiella oxytoca NOT DETECTED NOT DETECTED Final   Klebsiella pneumoniae NOT DETECTED NOT DETECTED Final   Proteus species NOT DETECTED NOT DETECTED Final   Serratia marcescens NOT DETECTED NOT DETECTED Final   Haemophilus influenzae NOT DETECTED NOT DETECTED Final   Neisseria meningitidis NOT DETECTED NOT DETECTED Final   Pseudomonas aeruginosa NOT DETECTED NOT DETECTED Final   Candida albicans NOT DETECTED NOT DETECTED Final   Candida glabrata NOT DETECTED NOT DETECTED Final   Candida krusei NOT DETECTED NOT DETECTED Final   Candida parapsilosis NOT DETECTED NOT DETECTED Final   Candida tropicalis NOT DETECTED NOT DETECTED Final    Comment: Performed at [facility], [place] [address], [place], [place] [address]     Radiology Studies: No results found.  Scheduled Meds: . allopurinol  100 mg Oral Daily  . dextrose  1 ampule Intravenous Once  . furosemide  40 mg Intravenous Q12H  . heparin  5,000 Units Subcutaneous Q8H  . hydrALAZINE  25 mg Oral BID  . levothyroxine  112 mcg Oral QAC  breakfast  . metoprolol tartrate  50 mg Oral BID  . polyethylene glycol  17 g Oral Daily  . simvastatin  20 mg Oral Daily   Continuous Infusions:   LOS: 7 days   Marylu Lund, MD [facility] Pager [phone]  If 7PM-7AM, please contact night-coverage [email or web address] Password [facility] 04/20/2018, 4:48 PM

## 2018-04-20 NOTE — Evaluation (Signed)
Physical Therapy Evaluation Patient Details Name: Kayla Hunt MRN: [id number] DOB: 01-Dec-1950 Today's Date: 04/20/2018   History of Present Illness  Pt is a 67 y/o female admitted from ALF secondary to hyperkalemia. Chest imaging revealed stable cardiomegaly. PMH includes HTN, gout, CKD, and DM.   Clinical Impression  Pt admitted secondary to problem above with deficits below. Pt used WC for primary mobility at ALF and was independent with transfers. Upon eval, pt presenting with increased pain and soreness in L knee which limited mobility. Attempted to stand X1, however, pt with increased pain and refusing to attempt further mobility. Educated about need for increased assist at ALF for transfers, and pt reports staff can assist with transfers to [facility] if needed. If staff unable to assist with transfers, will need short term SNF to increase independence prior to return to ALF. Will continue to follow acutely to maximize functional mobility independence and safety.     Follow Up Recommendations Home health PT;Supervision/Assistance - 24 hour(if staff at ALF cannot assist with transfers will need SNF )    Equipment Recommendations  None recommended by PT    Recommendations for Other Services       Precautions / Restrictions Precautions Precautions: Fall Restrictions Weight Bearing Restrictions: No      Mobility  Bed Mobility Overal bed mobility: Needs Assistance Bed Mobility: Supine to Sit;Sit to Supine     Supine to sit: Supervision;HOB elevated Sit to supine: Mod assist   General bed mobility comments: Supervision and increased time to come to sitting. Required use of bed rails and elevated HOB. Mod A for LE assist to return to supine.   Transfers                 General transfer comment: Attempted transfer X1, however, pt with increased pain and refused further attempts.   Ambulation/Gait                Stairs            Wheelchair Mobility     Modified Rankin (Stroke Patients Only)       Balance Overall balance assessment: Needs assistance Sitting-balance support: No upper extremity supported;Feet supported Sitting balance-Leahy Scale: Fair                                       Pertinent Vitals/Pain Pain Assessment: Faces Faces Pain Scale: Hurts even more Pain Location: L knee  Pain Descriptors / Indicators: Aching;Sore Pain Intervention(s): Limited activity within patient's tolerance;Monitored during session;Repositioned    Home Living Family/patient expects to be discharged to:: Assisted living               Home Equipment: Walker - 2 wheels;Wheelchair - manual      Prior Function Level of Independence: Needs assistance   Gait / Transfers Assistance Needed: Pt reports she was independent with transfers to [facility], and used WC for primary mobility.   ADL's / Homemaking Assistance Needed: Pt reports staff assisted with ADL tasks.         Hand Dominance        Extremity/Trunk Assessment   Upper Extremity Assessment Upper Extremity Assessment: Defer to OT evaluation    Lower Extremity Assessment Lower Extremity Assessment: LLE deficits/detail;Generalized weakness LLE Deficits / Details: LLE pain limited ROM. Pt reports pain is new. Able to perform LAQ insitting, however, very guarded.     Cervical /  Trunk Assessment Cervical / Trunk Assessment: Normal  Communication   Communication: HOH  Cognition Arousal/Alertness: Awake/alert Behavior During Therapy: WFL for tasks assessed/performed Overall Cognitive Status: Within Functional Limits for tasks assessed                                        General Comments General comments (skin integrity, edema, etc.): Discussed need for increased assist at ALF, and pt reports staff can assist with transfers to [facility].     Exercises     Assessment/Plan    PT Assessment Patient needs continued PT services  PT Problem List  Decreased strength;Decreased range of motion;Decreased balance;Decreased activity tolerance;Decreased mobility;Decreased knowledge of use of DME;Decreased knowledge of precautions;Pain       PT Treatment Interventions DME instruction;Functional mobility training;Therapeutic activities;Therapeutic exercise;Balance training;Patient/family education    PT Goals (Current goals can be found in the Care Plan section)  Acute Rehab PT Goals Patient Stated Goal: to go back to ALF ASAP  PT Goal Formulation: With patient Time For Goal Achievement: 05/04/18 Potential to Achieve Goals: Good    Frequency Min 3X/week   Barriers to discharge        Co-evaluation               AM-PAC PT "6 Clicks" Daily Activity  Outcome Measure Difficulty turning over in bed (including adjusting bedclothes, sheets and blankets)?: A Little Difficulty moving from lying on back to sitting on the side of the bed? : A Lot Difficulty sitting down on and standing up from a chair with arms (e.g., wheelchair, bedside commode, etc,.)?: Unable Help needed moving to and from a bed to chair (including a wheelchair)?: A Lot Help needed walking in hospital room?: Total Help needed climbing 3-5 steps with a railing? : Total 6 Click Score: 10    End of Session Equipment Utilized During Treatment: Gait belt Activity Tolerance: Patient limited by pain Patient left: in bed;with call bell/phone within reach;with bed alarm set Nurse Communication: Mobility status PT Visit Diagnosis: Unsteadiness on feet (R26.81);Muscle weakness (generalized) (M62.81);Pain Pain - Right/Left: Left Pain - part of body: Knee    Time: 1208-1229 PT Time Calculation (min) (ACUTE ONLY): 21 min   Charges:   PT Evaluation $PT Eval Moderate Complexity: 1 Mod     PT G Codes:        Leighton Ruff, PT, DPT  Acute Rehabilitation Services  Pager: [phone]   Rudean Hitt 04/20/2018, 12:37 PM

## 2018-04-21 LAB — CULTURE, BLOOD (ROUTINE X 2): Special Requests: ADEQUATE

## 2018-04-21 LAB — BASIC METABOLIC PANEL
Anion gap: 11 (ref 5–15)
BUN: 33 mg/dL — AB (ref 6–20)
CALCIUM: 8.5 mg/dL — AB (ref 8.9–10.3)
CO2: 39 mmol/L — ABNORMAL HIGH (ref 22–32)
CREATININE: 1.84 mg/dL — AB (ref 0.44–1.00)
Chloride: 93 mmol/L — ABNORMAL LOW (ref 101–111)
GFR calc Af Amer: 32 mL/min — ABNORMAL LOW (ref >60)
GFR, EST NON AFRICAN AMERICAN: 28 mL/min — AB (ref >60)
GLUCOSE: 122 mg/dL — AB (ref 65–99)
Potassium: 3.8 mmol/L (ref 3.5–5.1)
Sodium: 143 mmol/L (ref 135–145)

## 2018-04-21 LAB — GLUCOSE, CAPILLARY
Glucose-Capillary: 120 mg/dL — ABNORMAL HIGH (ref 65–99)
Glucose-Capillary: 172 mg/dL — ABNORMAL HIGH (ref 65–99)
Glucose-Capillary: 112 mg/dL — ABNORMAL HIGH (ref 65–99)
Glucose-Capillary: 154 mg/dL — ABNORMAL HIGH (ref 65–99)

## 2018-04-21 NOTE — Social Work (Signed)
Pt from [place], now called [facility]. Following for discharge disposition.   [place], [place] Work [phone]

## 2018-04-21 NOTE — Progress Notes (Signed)
PROGRESS NOTE    Kayla Hunt  WUJ:[id number] DOB: 1951-09-18 DOA: 04/11/2018 PCP: Reymundo Poll, MD    Brief Narrative:  67 y.o.femalewithhistory of diabetes mellitus type 2, chronic kidney disease stage IV, hypertension, hypothyroidism, gout was referred from the skilled nursing facility after patient potassium was found to be around 6.8. Patient states she was noticed to have lower extremity edema and had lab works done which showed an elevated potassium and was referred to the ER patient denies any chest pain shortness of breath nausea vomiting or diarrhea.  Assessment & Plan:   Principal Problem:   Hyperkalemia Active Problems:   Hypothyroidism   Gout, unspecified   MILD MENTAL RETARDATION   Essential hypertension, benign   CHRONIC KIDNEY DISEASE STAGE III (MODERATE)   Secondary renal hyperparathyroidism (HCC)   Type II diabetes mellitus with renal manifestations ([place])   1. Hyperkalemia, with history of CKD stage IV - improved, today potassium is 4.6-patient came with potassium of 6.8 and was given patiromer as potassium was still elevated at 5.8, she was given Kayexalate 30 g p.o. x1 .  Hyperkalemia suspected to be related to outpatient lisinopril with underlying CKD.  Lisinopril is been stopped.  Patient was initially started on  started on patiromer, which was changed to Kayexalate after one dose, also was given D50 and IV insulin with bicarb.   1. Potassium has since normalized 2. Repeat bmet in AM 2. Patient noted to have recent low-grade temperature taken orally, of around 100 F. 1. CXR clear, reviewed 2. urine culture showed no growth 3. Patient had remained off antibiotics. 4. Cultures are positive for 1 out of 2 coag negative staph species, likely contaminant. 5. Of note, core temperature/rectal temperature was recently checked and patient was noted to be afebrile. 6. Remains stable at present 3. Possible Cellulitis: Patient noted to have right lower extremity  was warm to touch, and tender to palpation.  Patient had been treated with a course of Rocephin.  No significant erythema appreciated at this time. 1. Currently afebrile 2. No leukocytosis 3. Stable 4. Off abx 4. Pulmonary edema/lower extremity edema-venous duplex of lower extremities is negative for DVT.    1. Patient was initially started on p.o. Lasix, which was later transitioned to Lasix 20 mg IV every 12 hours with no significant drop in weight.  Lasix dose was increased to 40 mg IV every 12 hours with very good urine output thus far 2. Weight was reported to be 136 kg on admit 3. Continue lasix to 60mg  bid 4. Recheck bmet in AM 5. Wt today 125kg. Edema is improving 5. Hypertension- 1. lisinopril is currently on hold per above. 2. Patent is continued on metoprolol, hydralazine.  3. Blood pressure trends reviewed, controlled at this time 6. CKD stage IV-creatinine is around 2 which is at her baseline.   1. Increased lasix per above 2. Renal function is improving on increased dose of Lasix 3. Repeat bmet in AM 7. Diabetes mellitus-patient had an episode of hypoglycemia in the ED, oral hypoglycemics currently on hold.  Will continue with SSI coverage as needed 1. Glucose trends reviewed, stable at present 2. Continue current regimen 8. Hypothyroidism-continue Synthroid 1. Stable currently at this time 9. Anemia likely from underlying CKD- 1. Remains hemodynamically at this time  DVT prophylaxis: Heparin subQ Code Status: Full Family Communication: Pt in room, family not at bedside Disposition Plan: Return to ALF when fully diuresed  Consultants:     Procedures:  Antimicrobials: Anti-infectives (From admission, onward)   Start     Dose/Rate Route Frequency Ordered Stop   04/21/18 0600  vancomycin (VANCOCIN) 1,750 mg in sodium chloride 0.9 % 500 mL IVPB  Status:  Discontinued     1,750 mg 250 mL/hr over 120 Minutes Intravenous Every 48 hours 04/19/18 0028 04/19/18  1410   04/19/18 0030  vancomycin (VANCOCIN) 2,000 mg in sodium chloride 0.9 % 500 mL IVPB     2,000 mg 250 mL/hr over 120 Minutes Intravenous  Once 04/19/18 0028 04/19/18 0501      Subjective: Very eager to return to ALF today  Objective: Vitals:   04/20/18 2042 04/21/18 0420 04/21/18 0918 04/21/18 1500  BP: (!) 132/58 (!) 134/57 (!) 124/51   Pulse: 72 67 70   Resp: (!) 24 18 18    Temp: 99.1 F (37.3 C) 99.1 F (37.3 C) 97.8 F (36.6 C)   TempSrc: Oral Oral Oral   SpO2: 96% 92% 95%   Weight:    125.2 kg (276 lb)  Height:        Intake/Output Summary (Last 24 hours) at 04/21/2018 1640 Last data filed at 04/21/2018 1505 Gross per 24 hour  Intake 720 ml  Output 2250 ml  Net -1530 ml   Filed Weights   04/18/18 2054 04/19/18 2039 04/21/18 1500  Weight: 131.3 kg (289 lb 7.4 oz) 131.1 kg (289 lb) 125.2 kg (276 lb)    Examination: General exam: Awake, laying in bed, in nad Respiratory system: Normal respiratory effort, no wheezing Cardiovascular system: regular rate, s1, s2 Gastrointestinal system: Soft, nondistended, positive BS Central nervous system: CN2-12 grossly intact, strength intact Extremities: Perfused, no clubbing Skin: Normal skin turgor, no notable skin lesions seen, LE edema improving bilaterally Psychiatry: Mood normal // no visual hallucinations   Data Reviewed: I have personally reviewed following labs and imaging studies  CBC: Recent Labs  Lab 04/16/18 0643 04/18/18 0123 04/20/18 0725  WBC 6.5 8.5 6.6  HGB 11.2* 9.9* 10.0*  HCT 38.9 33.7* 33.8*  MCV 99.7 98.5 95.2  PLT 190 174 212   Basic Metabolic Panel: Recent Labs  Lab 04/17/18 0510 04/18/18 0123 04/19/18 0606 04/20/18 0725 04/21/18 0548  NA 141 140 142 142 143  K 4.2 4.4 4.5 3.7 3.8  CL 100* 100* 100* 95* 93*  CO2 33* 32 31 39* 39*  GLUCOSE 100* 134* 100* 116* 122*  BUN 31* 34* 32* 31* 33*  CREATININE 1.83* 2.01* 1.69* 1.68* 1.84*  CALCIUM 8.5* 8.4* 8.7* 8.4* 8.5*    GFR: Estimated Creatinine Clearance: 40.4 mL/min (A) (by C-G formula based on SCr of 1.84 mg/dL (H)). Liver Function Tests: No results for input(s): AST, ALT, ALKPHOS, BILITOT, PROT, ALBUMIN in the last 168 hours. No results for input(s): LIPASE, AMYLASE in the last 168 hours. No results for input(s): AMMONIA in the last 168 hours. Coagulation Profile: No results for input(s): INR, PROTIME in the last 168 hours. Cardiac Enzymes: No results for input(s): CKTOTAL, CKMB, CKMBINDEX, TROPONINI in the last 168 hours. BNP (last 3 results) No results for input(s): PROBNP in the last 8760 hours. HbA1C: No results for input(s): HGBA1C in the last 72 hours. CBG: Recent Labs  Lab 04/20/18 1137 04/20/18 1635 04/20/18 2107 04/21/18 0731 04/21/18 1155  GLUCAP 145* 194* 199* 120* 172*   Lipid Profile: No results for input(s): CHOL, HDL, LDLCALC, TRIG, CHOLHDL, LDLDIRECT in the last 72 hours. Thyroid Function Tests: No results for input(s): TSH, T4TOTAL, FREET4, T3FREE, THYROIDAB in the last  72 hours. Anemia Panel: No results for input(s): VITAMINB12, FOLATE, FERRITIN, TIBC, IRON, RETICCTPCT in the last 72 hours. Sepsis Labs: No results for input(s): PROCALCITON, LATICACIDVEN in the last 168 hours.  Recent Results (from the past 240 hour(s))  MRSA PCR Screening     Status: None   Collection Time: 04/12/18  7:57 PM  Result Value Ref Range Status   MRSA by PCR NEGATIVE NEGATIVE Final    Comment:        The GeneXpert MRSA Assay (FDA approved for NASAL specimens only), is one component of a comprehensive MRSA colonization surveillance program. It is not intended to diagnose MRSA infection nor to guide or monitor treatment for MRSA infections. Performed at [facility], [place] [address], [place], [place] [address]   Culture, Urine     Status: None   Collection Time: 04/15/18 12:53 PM  Result Value Ref Range Status   Specimen Description URINE, RANDOM  Final   Special Requests  NONE  Final   Culture   Final    NO GROWTH Performed at [facility], [place] [address] [place], [place] [address]    Report Status 04/16/2018 FINAL  Final  Culture, blood (routine x 2)     Status: None (Preliminary result)   Collection Time: 04/18/18  1:24 AM  Result Value Ref Range Status   Specimen Description BLOOD RIGHT ANTECUBITAL  Final   Special Requests   Final    BOTTLES DRAWN AEROBIC AND ANAEROBIC Blood Culture adequate volume   Culture   Final    NO GROWTH 3 DAYS Performed at [facility], [place] [address], [place], [place] [address]    Report Status PENDING  Incomplete  Culture, blood (routine x 2)     Status: Abnormal   Collection Time: 04/18/18  1:26 AM  Result Value Ref Range Status   Specimen Description BLOOD RIGHT HAND  Final   Special Requests   Final    BOTTLES DRAWN AEROBIC AND ANAEROBIC Blood Culture adequate volume   Culture  Setup Time   Final    GRAM POSITIVE COCCI ANAEROBIC BOTTLE ONLY CRITICAL RESULT CALLED TO, READ BACK BY AND VERIFIED WITH: J LEDFORD [facility] 04/18/18 2353 JDW    Culture (A)  Final    STAPHYLOCOCCUS SPECIES (COAGULASE NEGATIVE) THE SIGNIFICANCE OF ISOLATING THIS ORGANISM FROM A SINGLE SET OF BLOOD CULTURES WHEN MULTIPLE SETS ARE DRAWN IS UNCERTAIN. PLEASE NOTIFY THE MICROBIOLOGY DEPARTMENT WITHIN ONE WEEK IF SPECIATION AND SENSITIVITIES ARE REQUIRED. Performed at [facility], [place] [address] [place],  [address]    Report Status 04/21/2018 FINAL  Final  Blood Culture ID Panel (Reflexed)     Status: Abnormal   Collection Time: 04/18/18  1:26 AM  Result Value Ref Range Status   Enterococcus species NOT DETECTED NOT DETECTED Final   Listeria monocytogenes NOT DETECTED NOT DETECTED Final   Staphylococcus species DETECTED (A) NOT DETECTED Final    Comment: Methicillin (oxacillin) resistant coagulase negative staphylococcus. Possible blood culture contaminant (unless isolated from more than one blood culture draw or  clinical case suggests pathogenicity). No antibiotic treatment is indicated for blood  culture contaminants. CRITICAL RESULT CALLED TO, READ BACK BY AND VERIFIED WITH: J LEDFORD [facility] 04/18/18 2353 JDW    Staphylococcus aureus NOT DETECTED NOT DETECTED Final   Methicillin resistance DETECTED (A) NOT DETECTED Final    Comment: CRITICAL RESULT CALLED TO, READ BACK BY AND VERIFIED WITH: J [facility] 04/18/18 2353 JDW  Streptococcus species NOT DETECTED NOT DETECTED Final   Streptococcus agalactiae NOT DETECTED NOT DETECTED Final   Streptococcus pneumoniae NOT DETECTED NOT DETECTED Final   Streptococcus pyogenes NOT DETECTED NOT DETECTED Final   Acinetobacter baumannii NOT DETECTED NOT DETECTED Final   Enterobacteriaceae species NOT DETECTED NOT DETECTED Final   Enterobacter cloacae complex NOT DETECTED NOT DETECTED Final   Escherichia coli NOT DETECTED NOT DETECTED Final   Klebsiella oxytoca NOT DETECTED NOT DETECTED Final   Klebsiella pneumoniae NOT DETECTED NOT DETECTED Final   Proteus species NOT DETECTED NOT DETECTED Final   Serratia marcescens NOT DETECTED NOT DETECTED Final   Haemophilus influenzae NOT DETECTED NOT DETECTED Final   Neisseria meningitidis NOT DETECTED NOT DETECTED Final   Pseudomonas aeruginosa NOT DETECTED NOT DETECTED Final   Candida albicans NOT DETECTED NOT DETECTED Final   Candida glabrata NOT DETECTED NOT DETECTED Final   Candida krusei NOT DETECTED NOT DETECTED Final   Candida parapsilosis NOT DETECTED NOT DETECTED Final   Candida tropicalis NOT DETECTED NOT DETECTED Final    Comment: Performed at [facility], [place] [address], [place], [place] [address]     Radiology Studies: No results found.  Scheduled Meds: . allopurinol  100 mg Oral Daily  . dextrose  1 ampule Intravenous Once  . furosemide  60 mg Intravenous Q12H  . heparin  5,000 Units Subcutaneous Q8H  . hydrALAZINE  25 mg Oral BID  . levothyroxine  112 mcg Oral QAC breakfast  .  metoprolol tartrate  50 mg Oral BID  . polyethylene glycol  17 g Oral Daily  . simvastatin  20 mg Oral Daily   Continuous Infusions:   LOS: 8 days   Marylu Lund, MD [facility] Pager [phone]  If 7PM-7AM, please contact night-coverage [email or web address] Password [facility] 04/21/2018, 4:40 PM

## 2018-04-21 NOTE — NC FL2 (Signed)
[place] LEVEL OF CARE SCREENING TOOL     IDENTIFICATION  Patient Name: Kayla Hunt Birthdate: 07/14/1951 Sex: female Admission Date (Current Location): 04/11/2018  [place] and [place] Number:  Kayla Hunt [id number] [place] and Address:  [facility] [place]. [facility], [place] [address], [place], [place] [address]      Provider Number: [id number]  Attending Physician Name and Address:  Donne Hazel, MD  Relative Name and Phone Number:  Phillis Haggis - daughter, [phone]    Current Level of Care: Hospital Recommended Level of Care: Assisted Living Facility(Patient from [facility]F) Prior Approval Number:    Date Approved/Denied:   PASRR Number:    Discharge Plan: Other (Comment)(ALF)    Current Diagnoses: Patient Active Problem List   Diagnosis Date Noted  . Hyperkalemia 04/11/2018  . Type II diabetes mellitus with renal manifestations ([place]) 03/11/2015  . Osteoarthritis of both knees 03/11/2015  . Dyslipidemia 10/02/2007  . MILD MENTAL RETARDATION 10/02/2007  . DEAFNESS, UNILATERAL 10/02/2007  . Secondary renal hyperparathyroidism ([place]) 01/04/2007  . CHRONIC KIDNEY DISEASE STAGE III (MODERATE) 11/27/2004  . Gout, unspecified 01/06/2003  . Hypothyroidism 05/28/1998  . Essential hypertension, benign 04/15/1998    Orientation RESPIRATION BLADDER Height & Weight     Self, Place, Situation  Normal Continent Weight: 289 lb (131.1 kg) Height:  5\' 4"  (162.6 cm)  BEHAVIORAL SYMPTOMS/MOOD NEUROLOGICAL BOWEL NUTRITION STATUS      Continent Diet(Regular)  AMBULATORY STATUS COMMUNICATION OF NEEDS Skin   Total Care(Uses a wheelchair) Verbally Normal                       Personal Care Assistance Level of Assistance  Bathing, Feeding, Dressing Bathing Assistance: Limited assistance Feeding assistance: Independent Dressing Assistance: Independent     Functional Limitations Info  Sight, Hearing, Speech Sight Info: Adequate Hearing  Info: Adequate Speech Info: Adequate    SPECIAL CARE FACTORS FREQUENCY  PT (By licensed PT)     PT Frequency: 3x week              Contractures Contractures Info: Not present    Additional Factors Info  Code Status, Allergies Code Status Info: Full Allergies Info: No known allergies           Current Medications (04/21/2018):  This is the current hospital active medication list Current Facility-Administered Medications  Medication Dose Route Frequency Provider Last Rate Last Dose  . acetaminophen (TYLENOL) tablet 650 mg  650 mg Oral Q6H PRN Rise Patience, MD   650 mg at 04/18/18 0038   Or  . acetaminophen (TYLENOL) suppository 650 mg  650 mg Rectal Q6H PRN Rise Patience, MD      . allopurinol (ZYLOPRIM) tablet 100 mg  100 mg Oral Daily Rise Patience, MD   100 mg at 04/20/18 1041  . benzonatate (TESSALON) capsule 200 mg  200 mg Oral TID PRN Oswald Hillock, MD   200 mg at 04/20/18 2209  . dextrose 50 % solution 50 mL  1 ampule Intravenous Once Rise Patience, MD      . diphenhydrAMINE-zinc acetate (BENADRYL) 2-0.1 % cream   Topical BID PRN Donne Hazel, MD      . furosemide (LASIX) injection 60 mg  60 mg Intravenous Q12H Donne Hazel, MD   60 mg at 04/21/18 0543  . heparin injection 5,000 Units  5,000 Units Subcutaneous Q8H Rise Patience, MD   5,000 Units at 04/21/18  9093  . hydrALAZINE (APRESOLINE) injection 5 mg  5 mg Intravenous Q4H PRN Donne Hazel, MD      . hydrALAZINE (APRESOLINE) tablet 25 mg  25 mg Oral BID Oswald Hillock, MD   25 mg at 04/20/18 2209  . lactulose (CHRONULAC) 10 GM/15ML solution 10 g  10 g Oral BID PRN Donne Hazel, MD   10 g at 04/20/18 2212  . levothyroxine (SYNTHROID, LEVOTHROID) tablet 112 mcg  112 mcg Oral QAC breakfast Rise Patience, MD   112 mcg at 04/21/18 0840  . metoprolol tartrate (LOPRESSOR) tablet 50 mg  50 mg Oral BID Rise Patience, MD   50 mg at 04/20/18 2209  . ondansetron  (ZOFRAN) tablet 4 mg  4 mg Oral Q6H PRN Rise Patience, MD       Or  . ondansetron [facility]) injection 4 mg  4 mg Intravenous Q6H PRN Rise Patience, MD      . polyethylene glycol (MIRALAX / GLYCOLAX) packet 17 g  17 g Oral Daily Donne Hazel, MD   17 g at 04/20/18 1040  . simvastatin (ZOCOR) tablet 20 mg  20 mg Oral Daily Rise Patience, MD   20 mg at 04/20/18 1040     Discharge Medications: Please see discharge summary for a list of discharge medications.  Relevant Imaging Results:  Relevant Lab Results:   Additional Information ss#[id number].    Alexander Mt, LCSWA

## 2018-04-21 NOTE — Evaluation (Signed)
Occupational Therapy Evaluation Patient Details Name: Kayla Hunt MRN: [id number] DOB: [birth year] Today's Date: 04/21/2018    History of Present Illness Pt is a 67 y/o female admitted from ALF secondary to hyperkalemia. Chest imaging revealed stable cardiomegaly. PMH includes HTN, gout, CKD, and DM.    Clinical Impression   PTA, pt was living at ALF and reports that she performed BADLs and transfers to her w/c. Pt currently requiring Mod A for bathing/dressing, Max A for toileting, and Mod A +2 for functional transfer to recliner. Pt presenting with decreased strength, balance, and awareness. Pt would benefit from further acute OT to facilitate safe dc. Recommend return to ALF pending support. If physical support is not provided, then recommend dc to SNF for further OT to optimize safety, independence with ADLs, and return to PLOF.      Follow Up Recommendations  SNF;Supervision/Assistance - 24 hour(Pending amount of physcal support at ALF)    Equipment Recommendations  None recommended by OT    Recommendations for Other Services PT consult     Precautions / Restrictions Precautions Precautions: Fall Restrictions Weight Bearing Restrictions: No      Mobility Bed Mobility Overal bed mobility: Needs Assistance Bed Mobility: Supine to Sit;Sit to Supine;Rolling Rolling: Min assist   Supine to sit: HOB elevated;Min assist     General bed mobility comments: Min A to roll for cleaning at bed level. Min A to facilitate hips towards EOB with bed pad  Transfers Overall transfer level: Needs assistance   Transfers: Sit to/from [employer] Transfers Sit to Stand: Min assist;+2 physical assistance   Squat pivot transfers: Mod assist;+2 physical assistance     General transfer comment: Min A to power up into standing. Requiring Bil knees blocked. Pt required Mod A +2 to facilitate wiehgt shift and pivot towards drop arm recliner. Use pf bed pad to facilitate hip  movement    Balance Overall balance assessment: Needs assistance Sitting-balance support: No upper extremity supported;Feet supported Sitting balance-Leahy Scale: Fair     Standing balance support: Bilateral upper extremity supported;During functional activity Standing balance-Leahy Scale: Poor Standing balance comment: Reliant on physical A                           ADL either performed or assessed with clinical judgement   ADL Overall ADL's : Needs assistance/impaired Eating/Feeding: Set up;Sitting   Grooming: Set up;Supervision/safety;Sitting   Upper Body Bathing: Moderate assistance;Sitting   Lower Body Bathing: Sit to/from stand;Moderate assistance   Upper Body Dressing : Moderate assistance;Sitting   Lower Body Dressing: Moderate assistance;Sit to/from stand Lower Body Dressing Details (indicate cue type and reason): Pt able to bring RLE to EOB to don bedroom slippers. Pt requiirng assistance for donning of left slipper Toilet Transfer: Moderate assistance;+2 for physical assistance;Squat-pivot Toilet Transfer Details (indicate cue type and reason): Pt performing squat pivot of two from EOB to drop arm recliner. Pt able to power up into standing with Min A +2 but then requires increased assistance to facilitate pivot and weight shift Toileting- Clothing Manipulation and Hygiene: Maximal assistance;Bed level Toileting - Clothing Manipulation Details (indicate cue type and reason): Min A to roll for cleaning at bed level. Max A for toilet hygiene and pt unable to reach peri area     Functional mobility during ADLs: Moderate assistance;+2 for physical assistance General ADL Comments: Pt agreeable to therapy and stating "I am ready to go home"     Vision  Perception     Praxis      Pertinent Vitals/Pain Pain Assessment: Faces Faces Pain Scale: Hurts a little bit Pain Location: generalized Pain Descriptors / Indicators: Aching;Sore Pain  Intervention(s): Monitored during session     Hand Dominance     Extremity/Trunk Assessment Upper Extremity Assessment Upper Extremity Assessment: Generalized weakness   Lower Extremity Assessment Lower Extremity Assessment: Defer to PT evaluation LLE Deficits / Details: LLE pain limited ROM. Pt reports pain is new. Able to perform LAQ insitting, however, very guarded.    Cervical / Trunk Assessment Cervical / Trunk Assessment: Other exceptions Cervical / Trunk Exceptions: increased body habitus   Communication Communication Communication: HOH   Cognition Arousal/Alertness: Awake/alert Behavior During Therapy: WFL for tasks assessed/performed Overall Cognitive Status: No family/caregiver present to determine baseline cognitive functioning                                     General Comments       Exercises     Shoulder Instructions      Home Living Family/patient expects to be discharged to:: Assisted living                             Home Equipment: Walker - 2 wheels;Wheelchair - manual          Prior Functioning/Environment Level of Independence: Needs assistance  Gait / Transfers Assistance Needed: Pt reports she was independent with transfers to [facility], and used WC for primary mobility.  ADL's / Homemaking Assistance Needed: Pt reports she is able to do BADLs with some staff assistance.             OT Problem List: Decreased strength;Decreased range of motion;Decreased activity tolerance;Impaired balance (sitting and/or standing);Decreased cognition;Decreased safety awareness;Decreased knowledge of use of DME or AE;Decreased knowledge of precautions;Pain      OT Treatment/Interventions: Self-care/ADL training;Therapeutic exercise;Energy conservation;DME and/or AE instruction;Therapeutic activities;Patient/family education    OT Goals(Current goals can be found in the care plan section) Acute Rehab OT Goals Patient Stated Goal: to  go back to ALF ASAP  OT Goal Formulation: With patient Time For Goal Achievement: 05/05/18 Potential to Achieve Goals: Good ADL Goals Pt Will Perform Upper Body Dressing: with set-up;with supervision;sitting Pt Will Perform Lower Body Dressing: with min guard assist;sit to/from stand Pt Will Transfer to Toilet: with min guard assist;stand pivot transfer;bedside commode Pt Will Perform Toileting - Clothing Manipulation and hygiene: with min guard assist;sitting/lateral leans;with adaptive equipment;sit to/from stand  OT Frequency: Min 2X/week   Barriers to D/C:            Co-evaluation              AM-PAC PT "6 Clicks" Daily Activity     Outcome Measure Help from another person eating meals?: A Little Help from another person taking care of personal grooming?: A Little Help from another person toileting, which includes using toliet, bedpan, or urinal?: A Lot Help from another person bathing (including washing, rinsing, drying)?: A Lot Help from another person to put on and taking off regular upper body clothing?: A Lot Help from another person to put on and taking off regular lower body clothing?: A Lot 6 Click Score: 14   End of Session Equipment Utilized During Treatment: Gait belt Nurse Communication: Mobility status(Skin tears on sacrum)  Activity Tolerance: Patient tolerated treatment well  Patient left: in chair;with call bell/phone within reach  OT Visit Diagnosis: Unsteadiness on feet (R26.81);Other abnormalities of gait and mobility (R26.89);Muscle weakness (generalized) (M62.81);Pain Pain - part of body: (Generalized)                Time: 3235-5732 OT Time Calculation (min): 18 min Charges:  OT General Charges $OT Visit: 1 Visit OT Evaluation $OT Eval Moderate Complexity: 1 Mod G-Codes:     [place] MSOT, OTR/L Acute Rehab Pager: [phone] Office: [place] 04/21/2018, 4:42 PM

## 2018-04-22 LAB — BASIC METABOLIC PANEL
ANION GAP: 10 (ref 5–15)
BUN: 31 mg/dL — ABNORMAL HIGH (ref 6–20)
CHLORIDE: 88 mmol/L — AB (ref 101–111)
CO2: 43 mmol/L — ABNORMAL HIGH (ref 22–32)
CREATININE: 1.74 mg/dL — AB (ref 0.44–1.00)
Calcium: 8.8 mg/dL — ABNORMAL LOW (ref 8.9–10.3)
GFR calc non Af Amer: 30 mL/min — ABNORMAL LOW (ref >60)
GFR, EST AFRICAN AMERICAN: 35 mL/min — AB (ref >60)
Glucose, Bld: 119 mg/dL — ABNORMAL HIGH (ref 65–99)
Potassium: 3.7 mmol/L (ref 3.5–5.1)
Sodium: 141 mmol/L (ref 135–145)

## 2018-04-22 LAB — GLUCOSE, CAPILLARY
GLUCOSE-CAPILLARY: 158 mg/dL — AB (ref 65–99)
GLUCOSE-CAPILLARY: 201 mg/dL — AB (ref 65–99)
Glucose-Capillary: 125 mg/dL — ABNORMAL HIGH (ref 65–99)
Glucose-Capillary: 143 mg/dL — ABNORMAL HIGH (ref 65–99)

## 2018-04-22 NOTE — Progress Notes (Signed)
PROGRESS NOTE    Kayla Hunt  YKZ:[id number] DOB: April 16, 1951 DOA: 04/11/2018 PCP: Reymundo Poll, MD    Brief Narrative:  67 y.o.femalewithhistory of diabetes mellitus type 2, chronic kidney disease stage IV, hypertension, hypothyroidism, gout was referred from the skilled nursing facility after patient potassium was found to be around 6.8. Patient states she was noticed to have lower extremity edema and had lab works done which showed an elevated potassium and was referred to the ER patient denies any chest pain shortness of breath nausea vomiting or diarrhea.  Assessment & Plan:   Principal Problem:   Hyperkalemia Active Problems:   Hypothyroidism   Gout, unspecified   MILD MENTAL RETARDATION   Essential hypertension, benign   CHRONIC KIDNEY DISEASE STAGE III (MODERATE)   Secondary renal hyperparathyroidism (HCC)   Type II diabetes mellitus with renal manifestations ([place])   1. Hyperkalemia, with history of CKD stage IV - improved, today potassium is 4.6-patient came with potassium of 6.8 and was given patiromer as potassium was still elevated at 5.8, she was given Kayexalate 30 g p.o. x1 .  Hyperkalemia suspected to be related to outpatient lisinopril with underlying CKD.  Lisinopril is been stopped.  Patient was initially started on  started on patiromer, which was changed to Kayexalate after one dose, also was given D50 and IV insulin with bicarb.   1. Potassium has since normalized 2. Recheck bmet in AM 2. Patient noted to have recent low-grade temperature taken orally, of around 100 F. 1. CXR clear, reviewed 2. urine culture showed no growth 3. Patient had remained off antibiotics. 4. Cultures are positive for 1 out of 2 coag negative staph species, likely contaminant. 5. Of note, core temperature/rectal temperature was recently checked and patient was noted to be afebrile. 6. Currently stable 7. Afebrile 3. Possible Cellulitis: Patient noted to have right lower  extremity was warm to touch, and tender to palpation.  Patient had been treated with a course of Rocephin.  No significant erythema appreciated at this time. 1. Currently afebrile 2. No leukocytosis 3. Stable 4. Remains off abx 4. Pulmonary edema/lower extremity edema-venous duplex of lower extremities is negative for DVT.    1. Patient was initially started on p.o. Lasix, which was later transitioned to Lasix 20 mg IV every 12 hours with no significant drop in weight.  Lasix dose was increased to 40 mg IV every 12 hours with very good urine output thus far 2. Weight was reported to be 136 kg on admit 3. Continue lasix to 60mg  bid 4. Recheck bmet in AM 5. Reported wt 126kg. Edema is improving nicely 5. Hypertension- 1. lisinopril is currently on hold per above. 2. Patent is continued on metoprolol, hydralazine.  3. Blood pressure trends reviewed, controlled at this time 6. CKD stage IV-creatinine is around 2 which is at her baseline.   1. Increased lasix per above 2. Renal function is improving on increased dose of Lasix 3. Recheck bmet in AM 7. Diabetes mellitus-patient had an episode of hypoglycemia in the ED, oral hypoglycemics currently on hold.  Will continue with SSI coverage as needed 1. Glucose trends reviewed, stable at present 2. Will cont current regimen 8. Hypothyroidism-continue Synthroid 1. Stable currently at this time 9. Anemia likely from underlying CKD- 1. Presently stable  DVT prophylaxis: Heparin subQ Code Status: Full Family Communication: Pt in room, family not at bedside Disposition Plan: Return to ALF when fully diuresed  Consultants:     Procedures:  Antimicrobials: Anti-infectives (From admission, onward)   Start     Dose/Rate Route Frequency Ordered Stop   04/21/18 0600  vancomycin (VANCOCIN) 1,750 mg in sodium chloride 0.9 % 500 mL IVPB  Status:  Discontinued     1,750 mg 250 mL/hr over 120 Minutes Intravenous Every 48 hours 04/19/18 0028  04/19/18 1410   04/19/18 0030  vancomycin (VANCOCIN) 2,000 mg in sodium chloride 0.9 % 500 mL IVPB     2,000 mg 250 mL/hr over 120 Minutes Intravenous  Once 04/19/18 0028 04/19/18 0501      Subjective: Eager to be discharged  Objective: Vitals:   04/21/18 1730 04/21/18 2227 04/22/18 0508 04/22/18 0924  BP: 122/60  135/64 126/61  Pulse: 74  73 76  Resp: 20  20 20   Temp: 98 F (36.7 C)  99.2 F (37.3 C) 98.6 F (37 C)  TempSrc: Oral  Oral Oral  SpO2: 96%  95% 95%  Weight:  126.3 kg (278 lb 7.1 oz)    Height:        Intake/Output Summary (Last 24 hours) at 04/22/2018 1550 Last data filed at 04/22/2018 1015 Gross per 24 hour  Intake 540 ml  Output 1625 ml  Net -1085 ml   Filed Weights   04/19/18 2039 04/21/18 1500 04/21/18 2227  Weight: 131.1 kg (289 lb) 125.2 kg (276 lb) 126.3 kg (278 lb 7.1 oz)    Examination: General exam: Conversant, in no acute distress Respiratory system: normal chest rise, clear, no audible wheezing Cardiovascular system: regular rhythm, s1-s2 Gastrointestinal system: Nondistended, nontender, pos BS Central nervous system: No seizures, no tremors Extremities: No cyanosis, no joint deformities, edema improving Skin: No rashes, no pallor Psychiatry: Affect normal // no auditory hallucinations   Data Reviewed: I have personally reviewed following labs and imaging studies  CBC: Recent Labs  Lab 04/16/18 0643 04/18/18 0123 04/20/18 0725  WBC 6.5 8.5 6.6  HGB 11.2* 9.9* 10.0*  HCT 38.9 33.7* 33.8*  MCV 99.7 98.5 95.2  PLT 190 174 213   Basic Metabolic Panel: Recent Labs  Lab 04/18/18 0123 04/19/18 0606 04/20/18 0725 04/21/18 0548 04/22/18 0733  NA 140 142 142 143 141  K 4.4 4.5 3.7 3.8 3.7  CL 100* 100* 95* 93* 88*  CO2 32 31 39* 39* 43*  GLUCOSE 134* 100* 116* 122* 119*  BUN 34* 32* 31* 33* 31*  CREATININE 2.01* 1.69* 1.68* 1.84* 1.74*  CALCIUM 8.4* 8.7* 8.4* 8.5* 8.8*   GFR: Estimated Creatinine Clearance: 43 mL/min (A) (by  C-G formula based on SCr of 1.74 mg/dL (H)). Liver Function Tests: No results for input(s): AST, ALT, ALKPHOS, BILITOT, PROT, ALBUMIN in the last 168 hours. No results for input(s): LIPASE, AMYLASE in the last 168 hours. No results for input(s): AMMONIA in the last 168 hours. Coagulation Profile: No results for input(s): INR, PROTIME in the last 168 hours. Cardiac Enzymes: No results for input(s): CKTOTAL, CKMB, CKMBINDEX, TROPONINI in the last 168 hours. BNP (last 3 results) No results for input(s): PROBNP in the last 8760 hours. HbA1C: No results for input(s): HGBA1C in the last 72 hours. CBG: Recent Labs  Lab 04/21/18 1155 04/21/18 1643 04/21/18 2155 04/22/18 0744 04/22/18 1144  GLUCAP 172* 112* 154* 125* 158*   Lipid Profile: No results for input(s): CHOL, HDL, LDLCALC, TRIG, CHOLHDL, LDLDIRECT in the last 72 hours. Thyroid Function Tests: No results for input(s): TSH, T4TOTAL, FREET4, T3FREE, THYROIDAB in the last 72 hours. Anemia Panel: No results for input(s): VITAMINB12, FOLATE,  FERRITIN, TIBC, IRON, RETICCTPCT in the last 72 hours. Sepsis Labs: No results for input(s): PROCALCITON, LATICACIDVEN in the last 168 hours.  Recent Results (from the past 240 hour(s))  MRSA PCR Screening     Status: None   Collection Time: 04/12/18  7:57 PM  Result Value Ref Range Status   MRSA by PCR NEGATIVE NEGATIVE Final    Comment:        The GeneXpert MRSA Assay (FDA approved for NASAL specimens only), is one component of a comprehensive MRSA colonization surveillance program. It is not intended to diagnose MRSA infection nor to guide or monitor treatment for MRSA infections. Performed at [facility], [place] [address], [place], [place] [address]   Culture, Urine     Status: None   Collection Time: 04/15/18 12:53 PM  Result Value Ref Range Status   Specimen Description URINE, RANDOM  Final   Special Requests NONE  Final   Culture   Final    NO GROWTH Performed at  [facility], [place] [address], [place], [place] [address]    Report Status 04/16/2018 FINAL  Final  Culture, blood (routine x 2)     Status: None (Preliminary result)   Collection Time: 04/18/18  1:24 AM  Result Value Ref Range Status   Specimen Description BLOOD RIGHT ANTECUBITAL  Final   Special Requests   Final    BOTTLES DRAWN AEROBIC AND ANAEROBIC Blood Culture adequate volume   Culture   Final    NO GROWTH 4 DAYS Performed at [facility], [place] [address], [place],  [address]    Report Status PENDING  Incomplete  Culture, blood (routine x 2)     Status: Abnormal   Collection Time: 04/18/18  1:26 AM  Result Value Ref Range Status   Specimen Description BLOOD RIGHT HAND  Final   Special Requests   Final    BOTTLES DRAWN AEROBIC AND ANAEROBIC Blood Culture adequate volume   Culture  Setup Time   Final    GRAM POSITIVE COCCI ANAEROBIC BOTTLE ONLY CRITICAL RESULT CALLED TO, READ BACK BY AND VERIFIED WITH: J LEDFORD [facility] 04/18/18 2353 JDW    Culture (A)  Final    STAPHYLOCOCCUS SPECIES (COAGULASE NEGATIVE) THE SIGNIFICANCE OF ISOLATING THIS ORGANISM FROM A SINGLE SET OF BLOOD CULTURES WHEN MULTIPLE SETS ARE DRAWN IS UNCERTAIN. PLEASE NOTIFY THE MICROBIOLOGY DEPARTMENT WITHIN ONE WEEK IF SPECIATION AND SENSITIVITIES ARE REQUIRED. Performed at [facility], [place] [address] [place],  [address]    Report Status 04/21/2018 FINAL  Final  Blood Culture ID Panel (Reflexed)     Status: Abnormal   Collection Time: 04/18/18  1:26 AM  Result Value Ref Range Status   Enterococcus species NOT DETECTED NOT DETECTED Final   Listeria monocytogenes NOT DETECTED NOT DETECTED Final   Staphylococcus species DETECTED (A) NOT DETECTED Final    Comment: Methicillin (oxacillin) resistant coagulase negative staphylococcus. Possible blood culture contaminant (unless isolated from more than one blood culture draw or clinical case suggests pathogenicity). No antibiotic  treatment is indicated for blood  culture contaminants. CRITICAL RESULT CALLED TO, READ BACK BY AND VERIFIED WITH: J LEDFORD [facility] 04/18/18 2353 JDW    Staphylococcus aureus NOT DETECTED NOT DETECTED Final   Methicillin resistance DETECTED (A) NOT DETECTED Final    Comment: CRITICAL RESULT CALLED TO, READ BACK BY AND VERIFIED WITH: J LEDFORD [facility] 04/18/18 2353 JDW    Streptococcus species NOT DETECTED NOT DETECTED Final  Streptococcus agalactiae NOT DETECTED NOT DETECTED Final   Streptococcus pneumoniae NOT DETECTED NOT DETECTED Final   Streptococcus pyogenes NOT DETECTED NOT DETECTED Final   Acinetobacter baumannii NOT DETECTED NOT DETECTED Final   Enterobacteriaceae species NOT DETECTED NOT DETECTED Final   Enterobacter cloacae complex NOT DETECTED NOT DETECTED Final   Escherichia coli NOT DETECTED NOT DETECTED Final   Klebsiella oxytoca NOT DETECTED NOT DETECTED Final   Klebsiella pneumoniae NOT DETECTED NOT DETECTED Final   Proteus species NOT DETECTED NOT DETECTED Final   Serratia marcescens NOT DETECTED NOT DETECTED Final   Haemophilus influenzae NOT DETECTED NOT DETECTED Final   Neisseria meningitidis NOT DETECTED NOT DETECTED Final   Pseudomonas aeruginosa NOT DETECTED NOT DETECTED Final   Candida albicans NOT DETECTED NOT DETECTED Final   Candida glabrata NOT DETECTED NOT DETECTED Final   Candida krusei NOT DETECTED NOT DETECTED Final   Candida parapsilosis NOT DETECTED NOT DETECTED Final   Candida tropicalis NOT DETECTED NOT DETECTED Final    Comment: Performed at [facility], [place] [address], [place], [place] [address]     Radiology Studies: No results found.  Scheduled Meds: . allopurinol  100 mg Oral Daily  . dextrose  1 ampule Intravenous Once  . furosemide  60 mg Intravenous Q12H  . heparin  5,000 Units Subcutaneous Q8H  . hydrALAZINE  25 mg Oral BID  . levothyroxine  112 mcg Oral QAC breakfast  . metoprolol tartrate  50 mg Oral BID  . polyethylene  glycol  17 g Oral Daily  . simvastatin  20 mg Oral Daily   Continuous Infusions:   LOS: 9 days   Marylu Lund, MD [facility] Pager [phone]  If 7PM-7AM, please contact night-coverage [email or web address] Password [email or web address] 04/22/2018, 3:50 PM

## 2018-04-23 LAB — CULTURE, BLOOD (ROUTINE X 2)
Culture: NO GROWTH
Special Requests: ADEQUATE

## 2018-04-23 LAB — BASIC METABOLIC PANEL
Anion gap: 14 (ref 5–15)
BUN: 38 mg/dL — AB (ref 6–20)
CHLORIDE: 88 mmol/L — AB (ref 101–111)
CO2: 39 mmol/L — ABNORMAL HIGH (ref 22–32)
CREATININE: 2.11 mg/dL — AB (ref 0.44–1.00)
Calcium: 8.5 mg/dL — ABNORMAL LOW (ref 8.9–10.3)
GFR calc Af Amer: 27 mL/min — ABNORMAL LOW (ref >60)
GFR calc non Af Amer: 24 mL/min — ABNORMAL LOW (ref >60)
GLUCOSE: 144 mg/dL — AB (ref 65–99)
POTASSIUM: 3.8 mmol/L (ref 3.5–5.1)
SODIUM: 141 mmol/L (ref 135–145)

## 2018-04-23 LAB — GLUCOSE, CAPILLARY
GLUCOSE-CAPILLARY: 161 mg/dL — AB (ref 65–99)
Glucose-Capillary: 130 mg/dL — ABNORMAL HIGH (ref 65–99)
Glucose-Capillary: 169 mg/dL — ABNORMAL HIGH (ref 65–99)

## 2018-04-23 MED ORDER — CEPHALEXIN 500 MG PO CAPS: 500.0000 mg | ORAL_CAPSULE | Freq: Two times a day (BID) | ORAL | 0 refills | Status: AC

## 2018-04-23 MED ORDER — CEPHALEXIN 500 MG PO CAPS
500.0000 mg | ORAL_CAPSULE | Freq: Two times a day (BID) | ORAL | Status: DC
  Administered 2018-04-23: 500 mg via ORAL
  Filled 2018-04-23: qty 1

## 2018-04-23 MED ORDER — FUROSEMIDE 40 MG PO TABS: 40.0000 mg | ORAL_TABLET | Freq: Every day | ORAL | 0 refills | Status: DC

## 2018-04-23 NOTE — Clinical Social Work Note (Signed)
Patient medically stable for discharge back to [facility], transported by ambulance. CSW talked with Wells Guiles regarding patient's discharge and transmitted discharge paperwork to facility. Clinicals reviewed by Peter Congo and approved. CSW again attempted to reach patient's daughter Phillis Haggis - [phone] without success.  Jacquilyn Seldon Givens, MSW, LCSW Licensed Clinical Social Worker [place] [phone]

## 2018-04-23 NOTE — Care Management Note (Addendum)
Case Management Note  Patient Details  Name: MAGDELINE PRANGE MRN: [id number] Date of Birth: 10-Oct-1951  Subjective/Objective:                    Action/Plan:  Spoke to Cervatie at ALF. [facility] PT recommending HHPT. Cervatie instructed NCM to arrange with [facility]. Paged MD for order. Received order and referral given to [facility] with [facility]. Order faxed to ALF also as requested Expected Discharge Date:  04/23/18               Expected Discharge Plan:  Assisted Living / Rest Home  In-House Referral:     Discharge planning Services  CM Consult  Post Acute Care Choice:  Home Health Choice offered to:     DME Arranged:  N/A DME Agency:  NA  HH Arranged:  PT HH Agency:     Status of Service:  In process, will continue to follow  If discussed at Long Length of Stay Meetings, dates discussed:    Additional Comments:  Marilu Favre, RN 04/23/2018, 3:50 PM

## 2018-04-23 NOTE — Plan of Care (Addendum)
Nutrition Education Note  RD consulted for nutrition education regarding CHF.  RD provided "Heart Failure Nutrition Therapy" handout from the Academy of [employer]. Reviewed patient's dietary recall. Pt resides in ALF and reports she eats the meals that are provided for her and she likes the food and believes that what she is provided is low in sodium. Discouraged intake of processed foods and use of salt shaker. Pt verbalizes she does not use the salt shaker and likes the Mrs.Dash. Encouraged fresh fruits and vegetables as well as whole grain sources of carbohydrates to maximize fiber intake.   RD discussed why it is important for patient to adhere to diet recommendations, and emphasized the role of fluids, foods to avoid, and importance of weighing self daily. Teach back method used.  Also provided handout on "Potassium and CKD." Encouraged patient to limit high potassium foods in her diet. Avoid salt substitutes  Expect good compliance.  Body mass index is 46.17 kg/m. Pt meets criteria for morbid obesity based on current BMI.  Current diet order is Renal/Carb Modified, patient is consuming approximately 50-100%% of meals at this time. Labs and medications reviewed. No further nutrition interventions warranted at this time. RD contact information provided. If additional nutrition issues arise, please re-consult RD.   Kerman Passey MS, RD, [place], [place] [phone] Pager  [phone] Weekend/On-Call Pager

## 2018-04-23 NOTE — Progress Notes (Signed)
Physical Therapy Treatment Patient Details Name: Kayla Hunt MRN: [id number] DOB: Oct 23, 1951 Today's Date: 04/23/2018    History of Present Illness Pt is a 67 y/o female admitted from ALF secondary to hyperkalemia. Chest imaging revealed stable cardiomegaly. PMH includes HTN, gout, CKD, and DM.     PT Comments    Continuing work on functional mobility and activity tolerance, and Kayla Hunt indicated that she feels her transfers are improving, and today's were smoother than last session's; Session was conducted on [place], and her O2 sats remained greater than or equal to 92%;   Definitive DC plan will depend on how much assist is available to her at her ALF; if they can ramp up services and she has consistent help with transfers, familiar home environment with HHPT follow up at SNF will work; If she doesn't have the assist, must consider SNF.  Follow Up Recommendations  Home health PT;Supervision/Assistance - 24 hour(if staff at ALF cannot assist with transfers will need SNF )     Equipment Recommendations  None recommended by PT    Recommendations for Other Services       Precautions / Restrictions Precautions Precautions: Fall    Mobility  Bed Mobility Overal bed mobility: Needs Assistance Bed Mobility: Supine to Sit     Supine to sit: HOB elevated;Min assist     General bed mobility comments: Cues for technique; heavy use of rails; able to push self up from L semi-sidelying to sit, min assist to help scoot R hip to EOB  Transfers Overall transfer level: Needs assistance Equipment used: Rolling walker (2 wheeled) Transfers: Public house manager;Sit to/from Stand Sit to Stand: Min assist;+2 physical assistance   Squat pivot transfers: Mod assist;+2 physical assistance     General transfer comment: Light Mod assist and support at R side and shoulder girdle during squat pivot transfer to Pt's R side; Min assist to stand from chair, cues for hand placement and  safety; decr control of descent to sit  Ambulation/Gait Ambulation/Gait assistance: Mod assist;+2 safety/equipment           General Gait Details: small pivot steps performed during pivot transfer   Stairs             Wheelchair Mobility    Modified Rankin (Stroke Patients Only)       Balance Overall balance assessment: Needs assistance Sitting-balance support: No upper extremity supported;Feet supported Sitting balance-Leahy Scale: Fair       Standing balance-Leahy Scale: Poor Standing balance comment: Reliant on physical A                            Cognition Arousal/Alertness: Awake/alert Behavior During Therapy: WFL for tasks assessed/performed Overall Cognitive Status: No family/caregiver present to determine baseline cognitive functioning                                        Exercises      General Comments General comments (skin integrity, edema, etc.): Not completely sure staff at ALF can provide assist with transfers; this will need to be verified      Pertinent Vitals/Pain Pain Assessment: No/denies pain    Home Living                      Prior Function  PT Goals (current goals can now be found in the care plan section) Acute Rehab PT Goals Patient Stated Goal: to go back to ALF ASAP  PT Goal Formulation: With patient Time For Goal Achievement: 05/04/18 Potential to Achieve Goals: Good Progress towards PT goals: Progressing toward goals    Frequency    Min 3X/week      PT Plan Current plan remains appropriate(and will depend on how much assist is at ALF)    Co-evaluation              AM-PAC PT "6 Clicks" Daily Activity  Outcome Measure  Difficulty turning over in bed (including adjusting bedclothes, sheets and blankets)?: A Little Difficulty moving from lying on back to sitting on the side of the bed? : A Lot Difficulty sitting down on and standing up from a chair with  arms (e.g., wheelchair, bedside commode, etc,.)?: Unable Help needed moving to and from a bed to chair (including a wheelchair)?: A Lot Help needed walking in hospital room?: Total Help needed climbing 3-5 steps with a railing? : Total 6 Click Score: 10    End of Session Equipment Utilized During Treatment: Gait belt Activity Tolerance: Patient tolerated treatment well Patient left: in chair;with call bell/phone within reach;with nursing/sitter in room Nurse Communication: Mobility status PT Visit Diagnosis: Unsteadiness on feet (R26.81);Muscle weakness (generalized) (M62.81);Pain Pain - Right/Left: Left Pain - part of body: Knee     Time: 7681-1572 PT Time Calculation (min) (ACUTE ONLY): 19 min  Charges:  $Therapeutic Activity: 8-22 mins                    G Codes:       Roney Marion, PT  Acute Rehabilitation Services Pager [phone] Office [phone]    Colletta [place] 04/23/2018, 12:58 PM

## 2018-04-23 NOTE — Discharge Summary (Addendum)
Physician Discharge Summary  Kayla Hunt ZJI:[id number] DOB: 01-25-1951 DOA: 04/11/2018  PCP: Reymundo Poll, MD  Admit date: 04/11/2018 Discharge date: 04/23/2018  Admitted From: ALF Disposition:  ALF  Recommendations for Outpatient Follow-up:  1. Follow up with PCP in 1 week 2. Recommend daily weights, please notify MD if patient gains 5 pounds or more in one week (discharge weight 122kg = 268.4 lbs). May need increased lasix dose if weight goes up 3. Recommend referral to outpatient sleep study to rule out sleep apnea 4. Recommend repeat bmet in one week 5. Recommend physical therapy  Discharge Condition:Improved CODE STATUS:Full Diet recommendation: Regular, Low sodium recommended   Brief/Interim Summary: 67 y.o.femalewithhistory of diabetes mellitus type 2, chronic kidney disease stage IV, hypertension, hypothyroidism, gout was referred from the skilled nursing facility after patient potassium was found to be around 6.8. Patient states she was noticed to have lower extremity edema and had lab works done which showed an elevated potassium and was referred to the ER patient denies any chest pain shortness of breath nausea vomiting or diarrhea.  1. Hyperkalemia, with history of CKD stage IV - improved, today potassium is 4.6-patient came with potassium of 6.8 and was given patiromer as potassium was still elevated at 5.8, she was given Kayexalate 30 g p.o. x1 . Hyperkalemia suspected to be related to outpatient lisinopril with underlying CKD. Lisinopril is been stopped. Patient was initially started on started on patiromer, which was changed to Kayexalate after one dose, also was given D50 and IV insulin with bicarb.  1. Potassium has since normalized 2. Recommend repeat bmet in 1 week 2. Patient noted to have recent low-grade temperature taken orally, of around 100 F. 1. CXR clear, reviewed 2. urine culture showed no growth 3. Patient had remained off  antibiotics. 4. Cultures are positive for 1 out of 2 coag negative staph species, likely contaminant. 5. Of note, core temperature/rectal temperature was recently checked and patient was noted to be afebrile. 6. Currently stable 7. Afebrile 3. Possible Cellulitis: Patient noted to have right lower extremitywaswarm to touch, and tender to palpation.  Patient had been treated with a course of Rocephin.  No significant erythema appreciated at this time. 1. Currently afebrile 2. No leukocytosis 3. Stable 4. Persistent erythema noted at time of discharge. Will prescribe 5 days of keflex on discharge 4. Pulmonary edema/lower extremity edema secondary to acute on chronic diastolic chf-venous duplex of lower extremities is negative for DVT.  1. Very good urine output with IV lasix 2. LE edema much improved 3. Recommend repeat bmet in 1 week 4. Reported wt 122kg at time of discharge 5. Hypertension- 1. lisinopril is currently on hold per above. 2. Patent is continued on metoprolol, hydralazine. 3. Blood pressure trends reviewed, controlled at this time 6. CKD stage IV-creatinine is around 2 which is at her baseline.  1. Increased lasix per above 2. Renal function had been improving with lasix, now trending up at time of discharge to 2.11 3. Recommend repeat bmet in one week 7. Diabetes mellitus-patient had an episode of hypoglycemia in the ED, oral hypoglycemics currently on hold. Will continue with SSI coverage as needed 1. Glucose trends reviewed, stable at present 2. Will cont current regimen 8. Hypothyroidism-continue Synthroid 1. Stable currently at this time 9. Anemia likely from underlying CKD- 1. Presently stable 10. Hypoxia 1. Noted to be primarily at night. Question OSA. Recommend sleep study as outpatient 2. O2 sats much better during day while awake. Currently on room  air 11. Morbid Obesity  Discharge Diagnoses:  Principal Problem:   Hyperkalemia Active Problems:    Hypothyroidism   Gout, unspecified   MILD MENTAL RETARDATION   Essential hypertension, benign   CHRONIC KIDNEY DISEASE STAGE III (MODERATE)   Secondary renal hyperparathyroidism (HCC)   Type II diabetes mellitus with renal manifestations ([place])    Discharge Instructions   Allergies as of 04/23/2018   No Known Allergies     Medication List    STOP taking these medications   colchicine 0.6 MG tablet   lisinopril 40 MG tablet Commonly known as:  PRINIVIL,ZESTRIL     TAKE these medications   allopurinol 100 MG tablet Commonly known as:  ZYLOPRIM Take 100 mg by mouth daily.   cephALEXin 500 MG capsule Commonly known as:  KEFLEX Take 1 capsule (500 mg total) by mouth every 12 (twelve) hours for 5 days.   furosemide 40 MG tablet Commonly known as:  LASIX Take 1 tablet (40 mg total) by mouth daily. What changed:    medication strength  how much to take   glipiZIDE 5 MG tablet Commonly known as:  GLUCOTROL Take 5 mg by mouth 2 (two) times daily before a meal.   guaiFENesin-dextromethorphan 100-10 MG/5ML syrup Commonly known as:  ROBITUSSIN DM Take 10 mLs by mouth every 4 (four) hours as needed for cough.   hydrALAZINE 50 MG tablet Commonly known as:  APRESOLINE Take 50 mg by mouth 2 (two) times daily.   levothyroxine 112 MCG tablet Commonly known as:  SYNTHROID, LEVOTHROID Take 112 mcg by mouth daily before breakfast.   loratadine 10 MG tablet Commonly known as:  CLARITIN Take 10 mg by mouth daily as needed for allergies.   metoprolol tartrate 50 MG tablet Commonly known as:  LOPRESSOR Take 50 mg by mouth 2 (two) times daily.   simvastatin 20 MG tablet Commonly known as:  ZOCOR Take 20 mg by mouth daily.       No Known Allergies    Procedures/Studies: Dg Chest 2 View  Result Date: 04/13/2018 CLINICAL DATA:  Shortness of Breath EXAM: CHEST - 2 VIEW COMPARISON:  04/12/2018 FINDINGS: Cardiomegaly with vascular congestion. Diffuse interstitial  prominence throughout the lungs could reflect early interstitial edema. Small effusions suspected. No acute bony abnormality. IMPRESSION: Suspect mild interstitial edema/CHF and small effusions. Electronically Signed   By: Rolm Baptise M.D.   On: 04/13/2018 11:14   Dg Chest Port 1 View  Result Date: 04/18/2018 CLINICAL DATA:  Fever. EXAM: PORTABLE CHEST 1 VIEW COMPARISON:  Radiographs of April 13, 2018. FINDINGS: Stable cardiomegaly with central pulmonary vascular congestion. No pneumothorax or pleural effusion is noted. No consolidative process is noted. Bony thorax is unremarkable. IMPRESSION: Stable cardiomegaly with central pulmonary vascular congestion. Electronically Signed   By: Marijo Conception, M.D.   On: 04/18/2018 08:17   Dg Chest Port 1 View  Result Date: 04/12/2018 CLINICAL DATA:  Edema EXAM: PORTABLE CHEST 1 VIEW COMPARISON:  05/06/2011 FINDINGS: Cardiac enlargement with pulmonary vascular congestion. Probable interstitial edema. Negative for effusion. Lungs are well aerated. IMPRESSION: Cardiac enlargement with vascular congestion and mild interstitial edema. Electronically Signed   By: Franchot Gallo M.D.   On: 04/12/2018 07:27    Subjective: Eager to be discharged  Discharge Exam: Vitals:   04/23/18 0459 04/23/18 0938  BP: 129/61 (!) 112/56  Pulse: 70 72  Resp: 17 18  Temp: 98.1 F (36.7 C) 98.5 F (36.9 C)  SpO2: 94% 95%   Vitals:  04/22/18 2108 04/23/18 0459 04/23/18 0500 04/23/18 0938  BP: 140/61 129/61  (!) 112/56  Pulse: 73 70  72  Resp: 18 17  18   Temp: 99.3 F (37.4 C) 98.1 F (36.7 C)  98.5 F (36.9 C)  TempSrc: Oral Oral  Oral  SpO2: 95% 94%  95%  Weight: 122 kg (268 lb 15.4 oz)  122 kg (268 lb 15.4 oz)   Height:        General: Pt is alert, awake, not in acute distress Cardiovascular: RRR, S1/S2 +, no rubs, no gallops Respiratory: CTA bilaterally, no wheezing, no rhonchi Abdominal: Soft, NT, ND, bowel sounds + Extremities: no edema, no  cyanosis   The results of significant diagnostics from this hospitalization (including imaging, microbiology, ancillary and laboratory) are listed below for reference.     Microbiology: Recent Results (from the past 240 hour(s))  Culture, Urine     Status: None   Collection Time: 04/15/18 12:53 PM  Result Value Ref Range Status   Specimen Description URINE, RANDOM  Final   Special Requests NONE  Final   Culture   Final    NO GROWTH Performed at [facility], [address], [place], [place] [address]    Report Status 04/16/2018 FINAL  Final  Culture, blood (routine x 2)     Status: None   Collection Time: 04/18/18  1:24 AM  Result Value Ref Range Status   Specimen Description BLOOD RIGHT ANTECUBITAL  Final   Special Requests   Final    BOTTLES DRAWN AEROBIC AND ANAEROBIC Blood Culture adequate volume   Culture   Final    NO GROWTH 5 DAYS Performed at [facility], [place] [address], [place], [place] [address]    Report Status 04/23/2018 FINAL  Final  Culture, blood (routine x 2)     Status: Abnormal   Collection Time: 04/18/18  1:26 AM  Result Value Ref Range Status   Specimen Description BLOOD RIGHT HAND  Final   Special Requests   Final    BOTTLES DRAWN AEROBIC AND ANAEROBIC Blood Culture adequate volume   Culture  Setup Time   Final    GRAM POSITIVE COCCI ANAEROBIC BOTTLE ONLY CRITICAL RESULT CALLED TO, READ BACK BY AND VERIFIED WITH: J LEDFORD [facility] 04/18/18 2353 JDW    Culture (A)  Final    STAPHYLOCOCCUS SPECIES (COAGULASE NEGATIVE) THE SIGNIFICANCE OF ISOLATING THIS ORGANISM FROM A SINGLE SET OF BLOOD CULTURES WHEN MULTIPLE SETS ARE DRAWN IS UNCERTAIN. PLEASE NOTIFY THE MICROBIOLOGY DEPARTMENT WITHIN ONE WEEK IF SPECIATION AND SENSITIVITIES ARE REQUIRED. Performed at [facility], [place] [address], [place], [place] [address]    Report Status 04/21/2018 FINAL  Final  Blood Culture ID Panel (Reflexed)     Status: Abnormal   Collection Time: 04/18/18   1:26 AM  Result Value Ref Range Status   Enterococcus species NOT DETECTED NOT DETECTED Final   Listeria monocytogenes NOT DETECTED NOT DETECTED Final   Staphylococcus species DETECTED (A) NOT DETECTED Final    Comment: Methicillin (oxacillin) resistant coagulase negative staphylococcus. Possible blood culture contaminant (unless isolated from more than one blood culture draw or clinical case suggests pathogenicity). No antibiotic treatment is indicated for blood  culture contaminants. CRITICAL RESULT CALLED TO, READ BACK BY AND VERIFIED WITH: J LEDFORD [facility] 04/18/18 2353 JDW    Staphylococcus aureus NOT DETECTED NOT DETECTED Final   Methicillin resistance DETECTED (A) NOT DETECTED Final    Comment: CRITICAL RESULT CALLED  TO, READ BACK BY AND VERIFIED WITH: J LEDFORD [facility] 04/18/18 2353 JDW    Streptococcus species NOT DETECTED NOT DETECTED Final   Streptococcus agalactiae NOT DETECTED NOT DETECTED Final   Streptococcus pneumoniae NOT DETECTED NOT DETECTED Final   Streptococcus pyogenes NOT DETECTED NOT DETECTED Final   Acinetobacter baumannii NOT DETECTED NOT DETECTED Final   Enterobacteriaceae species NOT DETECTED NOT DETECTED Final   Enterobacter cloacae complex NOT DETECTED NOT DETECTED Final   Escherichia coli NOT DETECTED NOT DETECTED Final   Klebsiella oxytoca NOT DETECTED NOT DETECTED Final   Klebsiella pneumoniae NOT DETECTED NOT DETECTED Final   Proteus species NOT DETECTED NOT DETECTED Final   Serratia marcescens NOT DETECTED NOT DETECTED Final   Haemophilus influenzae NOT DETECTED NOT DETECTED Final   Neisseria meningitidis NOT DETECTED NOT DETECTED Final   Pseudomonas aeruginosa NOT DETECTED NOT DETECTED Final   Candida albicans NOT DETECTED NOT DETECTED Final   Candida glabrata NOT DETECTED NOT DETECTED Final   Candida krusei NOT DETECTED NOT DETECTED Final   Candida parapsilosis NOT DETECTED NOT DETECTED Final   Candida tropicalis NOT DETECTED NOT DETECTED Final     Comment: Performed at [facility], [place] [address] [place], [place] [address]     Labs: BNP (last 3 results) No results for input(s): BNP in the last 8760 hours. Basic Metabolic Panel: Recent Labs  Lab 04/19/18 0606 04/20/18 0725 04/21/18 0548 04/22/18 0733 04/23/18 0520  NA 142 142 143 141 141  K 4.5 3.7 3.8 3.7 3.8  CL 100* 95* 93* 88* 88*  CO2 31 39* 39* 43* 39*  GLUCOSE 100* 116* 122* 119* 144*  BUN 32* 31* 33* 31* 38*  CREATININE 1.69* 1.68* 1.84* 1.74* 2.11*  CALCIUM 8.7* 8.4* 8.5* 8.8* 8.5*   Liver Function Tests: No results for input(s): AST, ALT, ALKPHOS, BILITOT, PROT, ALBUMIN in the last 168 hours. No results for input(s): LIPASE, AMYLASE in the last 168 hours. No results for input(s): AMMONIA in the last 168 hours. CBC: Recent Labs  Lab 04/18/18 0123 04/20/18 0725  WBC 8.5 6.6  HGB 9.9* 10.0*  HCT 33.7* 33.8*  MCV 98.5 95.2  PLT 174 210   Cardiac Enzymes: No results for input(s): CKTOTAL, CKMB, CKMBINDEX, TROPONINI in the last 168 hours. BNP: Invalid input(s): POCBNP CBG: Recent Labs  Lab 04/22/18 1144 04/22/18 1630 04/22/18 2109 04/23/18 0751 04/23/18 1158  GLUCAP 158* 201* 143* 130* 161*   D-Dimer No results for input(s): DDIMER in the last 72 hours. Hgb A1c No results for input(s): HGBA1C in the last 72 hours. Lipid Profile No results for input(s): CHOL, HDL, LDLCALC, TRIG, CHOLHDL, LDLDIRECT in the last 72 hours. Thyroid function studies No results for input(s): TSH, T4TOTAL, T3FREE, THYROIDAB in the last 72 hours.  Invalid input(s): FREET3 Anemia work up No results for input(s): VITAMINB12, FOLATE, FERRITIN, TIBC, IRON, RETICCTPCT in the last 72 hours. Urinalysis    Component Value Date/Time   COLORURINE YELLOW 04/15/2018 1253   APPEARANCEUR CLEAR 04/15/2018 1253   LABSPEC 1.009 04/15/2018 1253   PHURINE 5.0 04/15/2018 1253   GLUCOSEU NEGATIVE 04/15/2018 1253   HGBUR NEGATIVE 04/15/2018 [place] [place]  04/15/2018 1253   KETONESUR NEGATIVE 04/15/2018 1253   PROTEINUR 100 (A) 04/15/2018 1253   UROBILINOGEN 0.2 03/07/2015 0923   NITRITE NEGATIVE 04/15/2018 1253   LEUKOCYTESUR NEGATIVE 04/15/2018 1253   Sepsis Labs Invalid input(s): PROCALCITONIN,  WBC,  LACTICIDVEN Microbiology Recent Results (from the past 240 hour(s))  Culture,  Urine     Status: None   Collection Time: 04/15/18 12:53 PM  Result Value Ref Range Status   Specimen Description URINE, RANDOM  Final   Special Requests NONE  Final   Culture   Final    NO GROWTH Performed at [facility], [address], [place], [place] [address]    Report Status 04/16/2018 FINAL  Final  Culture, blood (routine x 2)     Status: None   Collection Time: 04/18/18  1:24 AM  Result Value Ref Range Status   Specimen Description BLOOD RIGHT ANTECUBITAL  Final   Special Requests   Final    BOTTLES DRAWN AEROBIC AND ANAEROBIC Blood Culture adequate volume   Culture   Final    NO GROWTH 5 DAYS Performed at [facility], [place] [address] [place], [place] [address]    Report Status 04/23/2018 FINAL  Final  Culture, blood (routine x 2)     Status: Abnormal   Collection Time: 04/18/18  1:26 AM  Result Value Ref Range Status   Specimen Description BLOOD RIGHT HAND  Final   Special Requests   Final    BOTTLES DRAWN AEROBIC AND ANAEROBIC Blood Culture adequate volume   Culture  Setup Time   Final    GRAM POSITIVE COCCI ANAEROBIC BOTTLE ONLY CRITICAL RESULT CALLED TO, READ BACK BY AND VERIFIED WITH: J LEDFORD [facility] 04/18/18 2353 JDW    Culture (A)  Final    STAPHYLOCOCCUS SPECIES (COAGULASE NEGATIVE) THE SIGNIFICANCE OF ISOLATING THIS ORGANISM FROM A SINGLE SET OF BLOOD CULTURES WHEN MULTIPLE SETS ARE DRAWN IS UNCERTAIN. PLEASE NOTIFY THE MICROBIOLOGY DEPARTMENT WITHIN ONE WEEK IF SPECIATION AND SENSITIVITIES ARE REQUIRED. Performed at [facility], [place] [address], [place], [place] [address]    Report Status 04/21/2018 FINAL   Final  Blood Culture ID Panel (Reflexed)     Status: Abnormal   Collection Time: 04/18/18  1:26 AM  Result Value Ref Range Status   Enterococcus species NOT DETECTED NOT DETECTED Final   Listeria monocytogenes NOT DETECTED NOT DETECTED Final   Staphylococcus species DETECTED (A) NOT DETECTED Final    Comment: Methicillin (oxacillin) resistant coagulase negative staphylococcus. Possible blood culture contaminant (unless isolated from more than one blood culture draw or clinical case suggests pathogenicity). No antibiotic treatment is indicated for blood  culture contaminants. CRITICAL RESULT CALLED TO, READ BACK BY AND VERIFIED WITH: J LEDFORD [facility] 04/18/18 2353 JDW    Staphylococcus aureus NOT DETECTED NOT DETECTED Final   Methicillin resistance DETECTED (A) NOT DETECTED Final    Comment: CRITICAL RESULT CALLED TO, READ BACK BY AND VERIFIED WITH: J LEDFORD [facility] 04/18/18 2353 JDW    Streptococcus species NOT DETECTED NOT DETECTED Final   Streptococcus agalactiae NOT DETECTED NOT DETECTED Final   Streptococcus pneumoniae NOT DETECTED NOT DETECTED Final   Streptococcus pyogenes NOT DETECTED NOT DETECTED Final   Acinetobacter baumannii NOT DETECTED NOT DETECTED Final   Enterobacteriaceae species NOT DETECTED NOT DETECTED Final   Enterobacter cloacae complex NOT DETECTED NOT DETECTED Final   Escherichia coli NOT DETECTED NOT DETECTED Final   Klebsiella oxytoca NOT DETECTED NOT DETECTED Final   Klebsiella pneumoniae NOT DETECTED NOT DETECTED Final   Proteus species NOT DETECTED NOT DETECTED Final   Serratia marcescens NOT DETECTED NOT DETECTED Final   Haemophilus influenzae NOT DETECTED NOT DETECTED Final   Neisseria meningitidis NOT DETECTED NOT DETECTED Final   Pseudomonas aeruginosa NOT DETECTED NOT DETECTED Final  Candida albicans NOT DETECTED NOT DETECTED Final   Candida glabrata NOT DETECTED NOT DETECTED Final   Candida krusei NOT DETECTED NOT DETECTED Final   Candida  parapsilosis NOT DETECTED NOT DETECTED Final   Candida tropicalis NOT DETECTED NOT DETECTED Final    Comment: Performed at [facility], [place] [address] [place],  [address]   Time spent: 63min  SIGNED:   Marylu Lund, MD  [facility] 04/23/2018, 2:59 PM  If 7PM-7AM, please contact night-coverage [email or web address] Password [email or web address]

## 2018-04-23 NOTE — Progress Notes (Signed)
Attempted to give report at [employer], left a voicemail.

## 2018-10-30 IMAGING — DX DG CHEST 2V
2 series · 2 of 2 positions shown · non-contrast
Comparison: 04/12/2018

CLINICAL DATA: Shortness of Breath

EXAM:
CHEST - 2 VIEW

[chest lat]
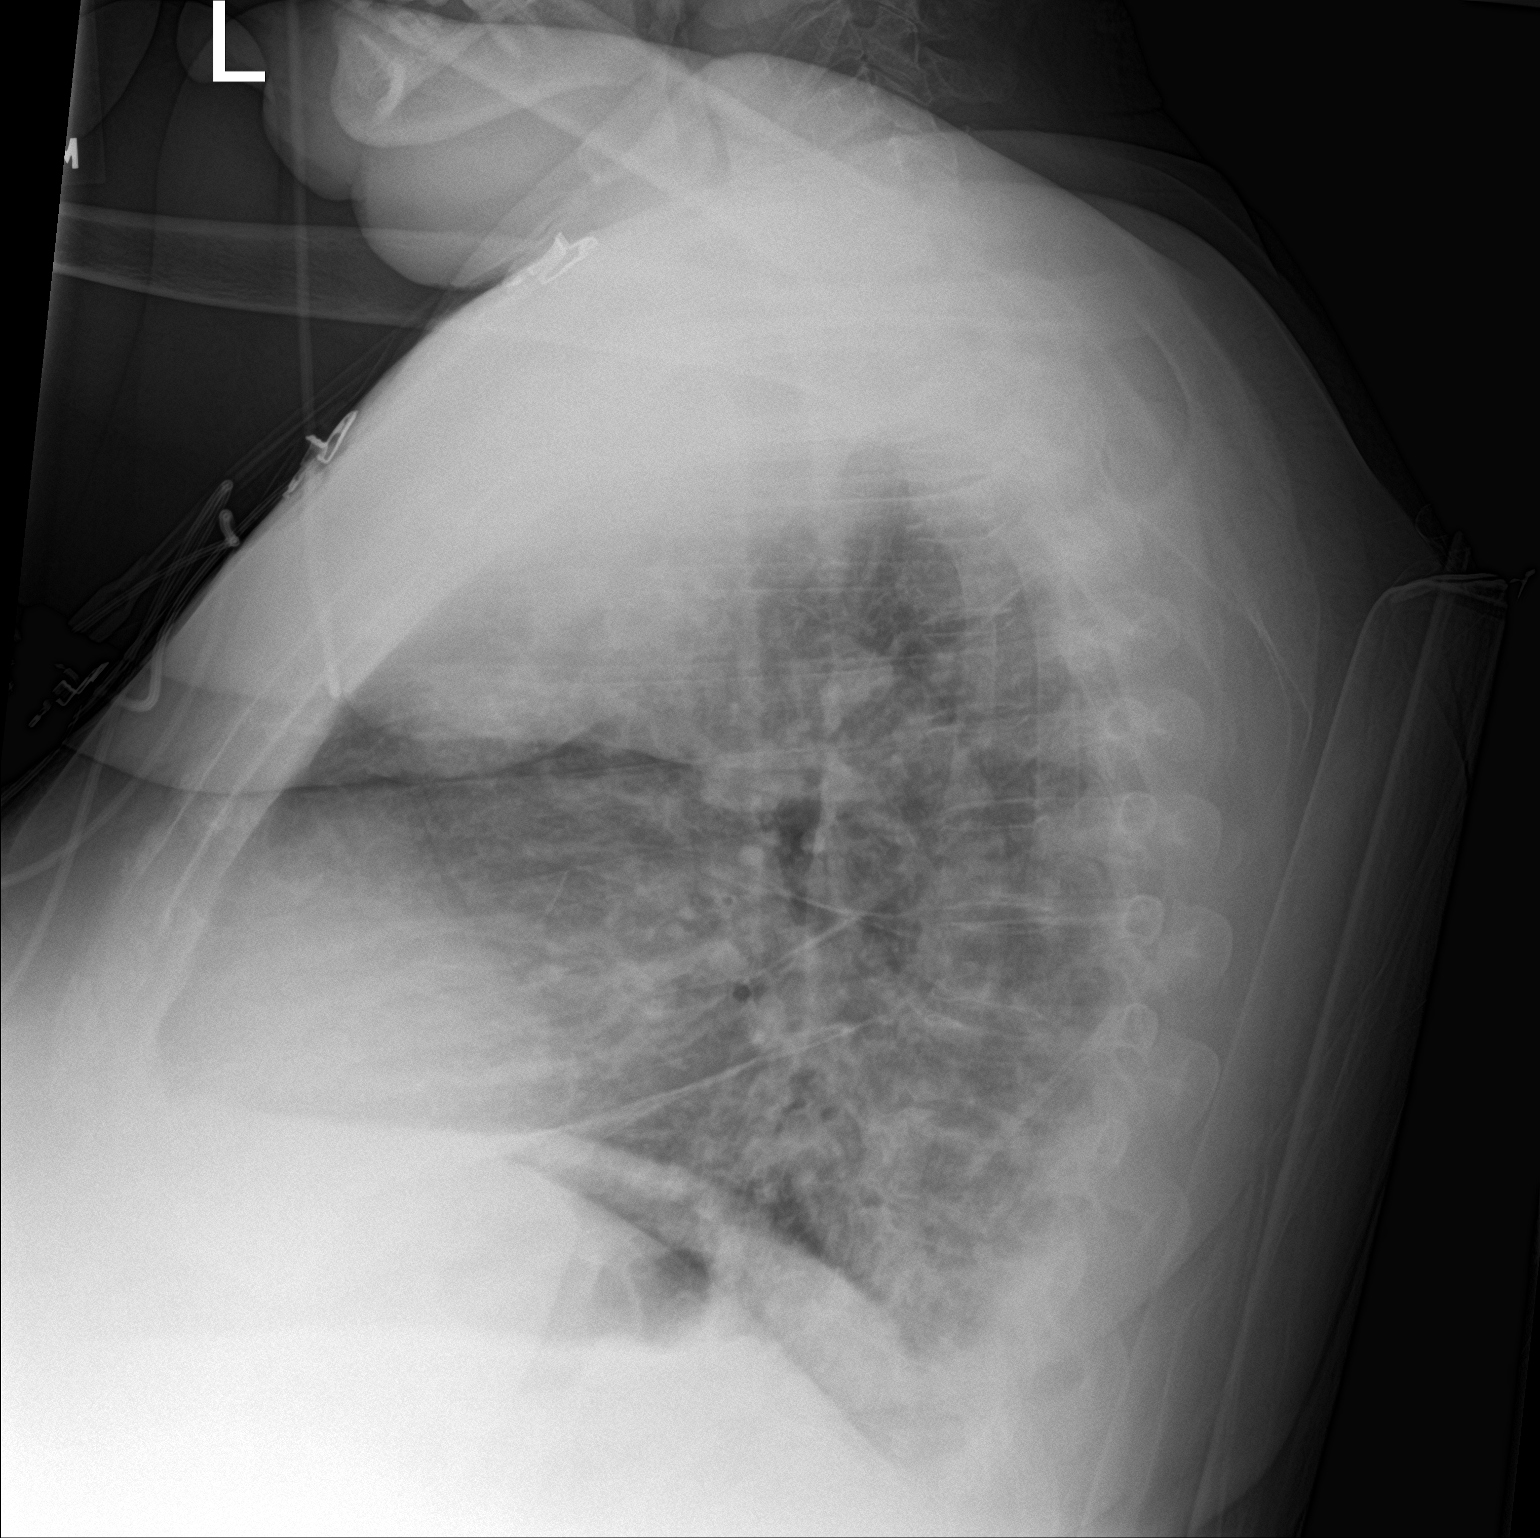

[chest ap]
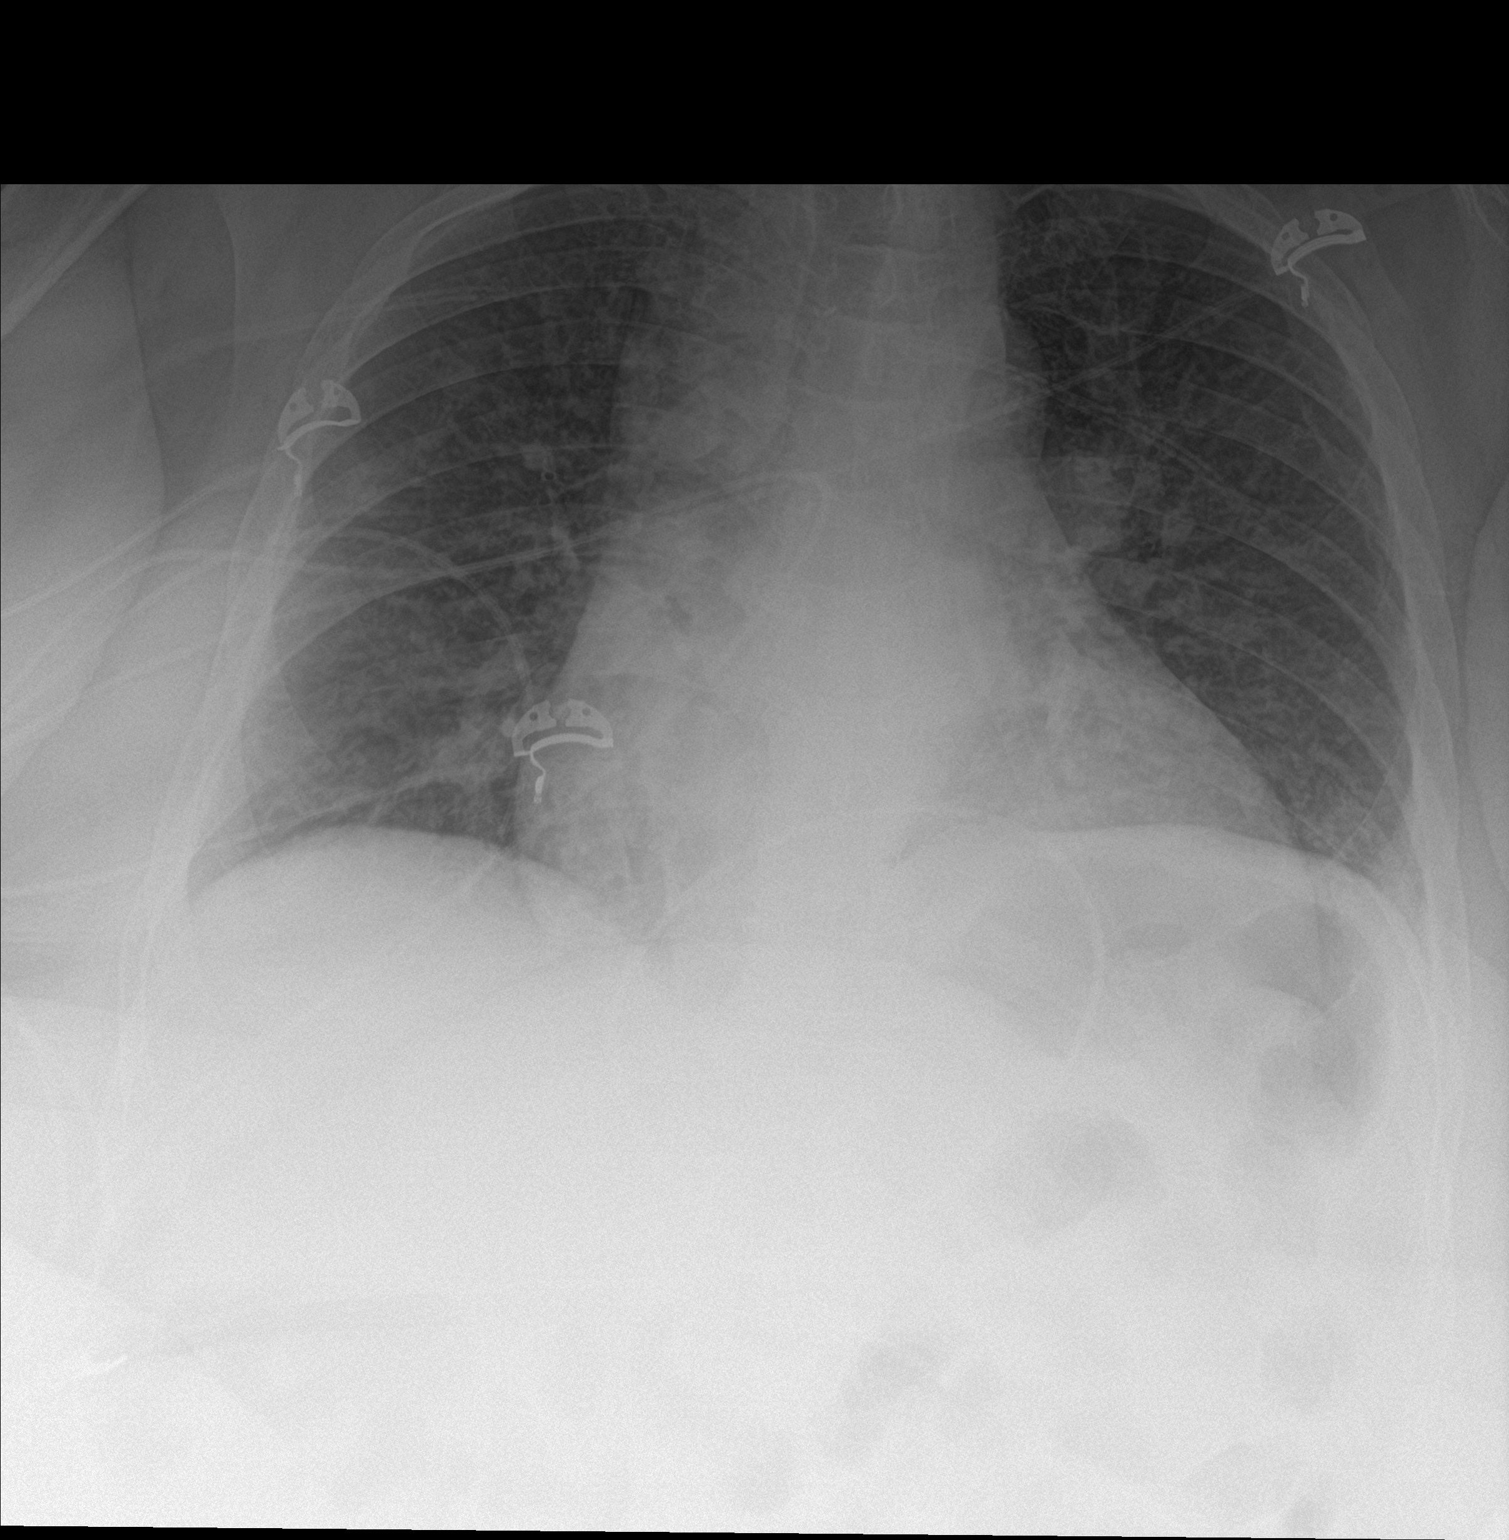

[2 of 2 positions shown; findings below may reference images not displayed]

FINDINGS: Cardiomegaly with vascular congestion. Diffuse interstitial
prominence throughout the lungs could reflect early interstitial
edema. Small effusions suspected. No acute bony abnormality.
IMPRESSION: Suspect mild interstitial edema/CHF and small effusions.

## 2019-03-16 ENCOUNTER — Ambulatory Visit (HOSPITAL_COMMUNITY)
Admission: EM | Admit: 2019-03-16 | Discharge: 2019-03-16 | Disposition: A | Discharge status: Home / Self Care | Payer: Medicare Other | Attending: Family Medicine | Admitting: Family Medicine

## 2019-03-16 ENCOUNTER — Other Ambulatory Visit: Payer: Self-pay

## 2019-03-16 DIAGNOSIS — M109 Gout, unspecified: Secondary | ICD-10-CM

## 2019-03-16 DIAGNOSIS — Z760 Encounter for issue of repeat prescription: Secondary | ICD-10-CM | POA: Diagnosis not present

## 2019-03-16 DIAGNOSIS — I10 Essential (primary) hypertension: Secondary | ICD-10-CM | POA: Diagnosis not present

## 2019-03-16 MED ORDER — ALLOPURINOL 100 MG PO TABS: 100.0000 mg | ORAL_TABLET | Freq: Every day | ORAL | 1 refills | Status: DC

## 2019-03-16 MED ORDER — FUROSEMIDE 40 MG PO TABS: 40.0000 mg | ORAL_TABLET | Freq: Every day | ORAL | 1 refills | Status: DC

## 2019-03-16 MED ORDER — HYDRALAZINE HCL 50 MG PO TABS: 50.0000 mg | ORAL_TABLET | Freq: Two times a day (BID) | ORAL | 1 refills | Status: DC

## 2019-03-16 MED ORDER — LEVOTHYROXINE SODIUM 112 MCG PO TABS: 112.0000 ug | ORAL_TABLET | Freq: Every day | ORAL | 11 refills | Status: DC

## 2019-03-16 MED ORDER — METOPROLOL TARTRATE 50 MG PO TABS: 50.0000 mg | ORAL_TABLET | Freq: Two times a day (BID) | ORAL | 1 refills | Status: DC

## 2019-03-16 MED ORDER — SIMVASTATIN 20 MG PO TABS: 20.0000 mg | ORAL_TABLET | Freq: Every day | ORAL | 1 refills | Status: DC

## 2019-03-16 MED ORDER — GLIPIZIDE 5 MG PO TABS: 5.0000 mg | ORAL_TABLET | Freq: Two times a day (BID) | ORAL | 1 refills | Status: DC

## 2019-03-16 NOTE — ED Provider Notes (Signed)
  [facility]    CSN: [id number] Arrival date & time: 03/16/19  1249     History    Chief Complaint  Patient presents with  . Medication Refill    Subjective: Patient is a 68 y.o. female here for med refill.  Hx of CHF on metoprolol tartrate 50 mg bid, hydralazine 50 mg bid, Lasix 40 mg/d.  On simvastatin 20 mg/d for cholesterol  Diet could be better, not much physical activity.  Hx of gout, on allopurinol 100 mg/d. Reports compliance and efficacy, no AE's.   No weight changes with thyroid medicine.   Recently ran out, unclear how often she is able to see her PCP. Physician on medicine bottle is Dr. Wyline Copas, who d/c'd her from [facility] in 2019.    ROS: Heart: Denies chest pain  Lungs: Denies SOB   Past Medical History:  Diagnosis Date  . Anemia   . Anxiety   . Arthritis    "legs" (04/12/2018)  . CHF (congestive heart failure) ([place])   . Chronic back pain   . Chronic edema    bilat LE's  . CKD (chronic kidney disease), stage IV ([place])    [name] 04/12/2018  . Depression   . Gout   . Hyperlipidemia   . Hypertension   . Hypothyroidism   . Seizures ([place]) 05/05/2011   [name] 05/05/2011  . Type II diabetes mellitus (HCC)     Objective: BP (!) 160/90 (BP Location: Right Arm)   Pulse 86   Resp 18   SpO2 98%  General: Awake, appears stated age HEENT: MMM, EOMi Heart: RRR, +SEM, +LE edema Lungs: CTAB, no rales, wheezes or rhonchi. No accessory muscle use Psych: Limited judgment and insight, normal affect and mood   Final Clinical Impressions(s) / UC Diagnoses   Final diagnoses:  Medication refill     Discharge Instructions     Schedule an appointment with Dr. Fredderick Phenix. If he is unavailable, consider finding a new provider.  Keep the diet clean and stay active.    ED Prescriptions    Medication Sig Dispense Auth. Provider   allopurinol (ZYLOPRIM) 100 MG tablet Take 1 tablet (100 mg total) by mouth daily. 30 tablet Shelda Pal, DO   metoprolol tartrate (LOPRESSOR) 50 MG tablet Take 1 tablet (50 mg total) by mouth 2 (two) times daily. 60 tablet Shelda Pal, DO   simvastatin (ZOCOR) 20 MG tablet Take 1 tablet (20 mg total) by mouth daily. 30 tablet Shelda Pal, DO   hydrALAZINE (APRESOLINE) 50 MG tablet Take 1 tablet (50 mg total) by mouth 2 (two) times a day. 60 tablet Shelda Pal, DO   levothyroxine (SYNTHROID) 112 MCG tablet Take 1 tablet (112 mcg total) by mouth daily. 30 tablet Shelda Pal, DO   glipiZIDE (GLUCOTROL) 5 MG tablet Take 1 tablet (5 mg total) by mouth 2 (two) times daily before a meal. 60 tablet Shelda Pal, DO   furosemide (LASIX) 40 MG tablet Take 1 tablet (40 mg total) by mouth daily. 30 tablet Shelda Pal, DO     Controlled Substance Prescriptions [place] Controlled Substance Registry consulted? Not Applicable   Shelda Pal, DO 03/16/19 1322

## 2019-03-16 NOTE — ED Triage Notes (Signed)
Per pt she is wanting her medication refilled. No issues today with anything medical.

## 2019-03-16 NOTE — Discharge Instructions (Signed)
Schedule an appointment with Dr. Fredderick Phenix. If he is unavailable, consider finding a new provider.  Keep the diet clean and stay active.

## 2019-03-16 NOTE — ED Triage Notes (Signed)
Upon returning from lunch I noticed patient sitting at the front of the building- per her family member's request I offered to call to let her know she was finished. As I was about to call I noticed patient's family member pulled up to pick her up. She got out of the car and asked "how long has she been sitting in the hot sun?" I explained I had just returned from lunch and was unsure, she persisted aggressively and I continued to explain to her I was unsure as I'd been gone. I then asked the nursing staff if they knew how long she had been outside or how she got there and they explained that she wheeled herself outside.

## 2019-05-13 ENCOUNTER — Other Ambulatory Visit: Payer: Self-pay | Admitting: Family Medicine

## 2019-05-20 ENCOUNTER — Encounter (HOSPITAL_COMMUNITY): Payer: Self-pay

## 2019-05-20 ENCOUNTER — Ambulatory Visit (HOSPITAL_COMMUNITY)
Admission: EM | Admit: 2019-05-20 | Discharge: 2019-05-20 | Disposition: A | Discharge status: Home / Self Care | Payer: Medicare Other | Attending: Family Medicine | Admitting: Family Medicine

## 2019-05-20 DIAGNOSIS — Z760 Encounter for issue of repeat prescription: Secondary | ICD-10-CM

## 2019-05-20 DIAGNOSIS — I10 Essential (primary) hypertension: Secondary | ICD-10-CM

## 2019-05-20 DIAGNOSIS — N184 Chronic kidney disease, stage 4 (severe): Secondary | ICD-10-CM | POA: Insufficient documentation

## 2019-05-20 DIAGNOSIS — E1122 Type 2 diabetes mellitus with diabetic chronic kidney disease: Secondary | ICD-10-CM | POA: Insufficient documentation

## 2019-05-20 DIAGNOSIS — Z9114 Patient's other noncompliance with medication regimen: Secondary | ICD-10-CM

## 2019-05-20 DIAGNOSIS — E039 Hypothyroidism, unspecified: Secondary | ICD-10-CM | POA: Diagnosis present

## 2019-05-20 DIAGNOSIS — I129 Hypertensive chronic kidney disease with stage 1 through stage 4 chronic kidney disease, or unspecified chronic kidney disease: Secondary | ICD-10-CM

## 2019-05-20 DIAGNOSIS — E785 Hyperlipidemia, unspecified: Secondary | ICD-10-CM | POA: Diagnosis present

## 2019-05-20 DIAGNOSIS — Z9119 Patient's noncompliance with other medical treatment and regimen: Secondary | ICD-10-CM

## 2019-05-20 LAB — COMPREHENSIVE METABOLIC PANEL
ALT: 18 U/L (ref 0–44)
AST: 22 U/L (ref 15–41)
Albumin: 2.8 g/dL — ABNORMAL LOW (ref 3.5–5.0)
Alkaline Phosphatase: 85 U/L (ref 38–126)
Anion gap: 9 (ref 5–15)
BUN: 31 mg/dL — ABNORMAL HIGH (ref 8–23)
CO2: 28 mmol/L (ref 22–32)
Calcium: 8.9 mg/dL (ref 8.9–10.3)
Chloride: 101 mmol/L (ref 98–111)
Creatinine, Ser: 1.72 mg/dL — ABNORMAL HIGH (ref 0.44–1.00)
GFR calc Af Amer: 36 mL/min — ABNORMAL LOW (ref >60)
GFR calc non Af Amer: 31 mL/min — ABNORMAL LOW (ref >60)
Glucose, Bld: 167 mg/dL — ABNORMAL HIGH (ref 70–99)
Potassium: 4.3 mmol/L (ref 3.5–5.1)
Sodium: 138 mmol/L (ref 135–145)
Total Bilirubin: 0.2 mg/dL — ABNORMAL LOW (ref 0.3–1.2)
Total Protein: 6.7 g/dL (ref 6.5–8.1)

## 2019-05-20 LAB — CBC
HCT: 41.3 % (ref 36.0–46.0)
Hemoglobin: 13 g/dL (ref 12.0–15.0)
MCH: 29.5 pg (ref 26.0–34.0)
MCHC: 31.5 g/dL (ref 30.0–36.0)
MCV: 93.7 fL (ref 80.0–100.0)
Platelets: 219 10*3/uL (ref 150–400)
RBC: 4.41 MIL/uL (ref 3.87–5.11)
RDW: 16.8 % — ABNORMAL HIGH (ref 11.5–15.5)
WBC: 6 10*3/uL (ref 4.0–10.5)
nRBC: 0 % (ref 0.0–0.2)

## 2019-05-20 MED ORDER — LEVOTHYROXINE SODIUM 112 MCG PO TABS: 112.0000 ug | ORAL_TABLET | Freq: Every day | ORAL | 0 refills | Status: DC

## 2019-05-20 MED ORDER — METOPROLOL TARTRATE 50 MG PO TABS: 50.0000 mg | ORAL_TABLET | Freq: Two times a day (BID) | ORAL | 0 refills | Status: DC

## 2019-05-20 MED ORDER — GLIPIZIDE 5 MG PO TABS: 5.0000 mg | ORAL_TABLET | Freq: Two times a day (BID) | ORAL | 0 refills | Status: DC

## 2019-05-20 MED ORDER — ALLOPURINOL 100 MG PO TABS: 100.0000 mg | ORAL_TABLET | Freq: Every day | ORAL | 0 refills | Status: DC

## 2019-05-20 MED ORDER — HYDRALAZINE HCL 50 MG PO TABS: 50.0000 mg | ORAL_TABLET | Freq: Two times a day (BID) | ORAL | 0 refills | Status: DC

## 2019-05-20 MED ORDER — SIMVASTATIN 20 MG PO TABS: 20.0000 mg | ORAL_TABLET | Freq: Every day | ORAL | 0 refills | Status: DC

## 2019-05-20 NOTE — Discharge Instructions (Addendum)
IMPORTANT: You MUST call tomorrow to get an appointment with a primary care doctor It is not safe to continue to get medicine refills though urgent/emergency rooms You need regular visits and lab testing to monitor your health conditions You need preventative medical care  I am giving you one month of medicine until you can see your new doctor

## 2019-05-20 NOTE — ED Triage Notes (Signed)
Patient presents to Urgent Care with complaints of needing medication refills on all her daily meds since running out last week. Patient educated on the importance of following up with a PCP and that the [facility] is not the best place for medication refills. Pt verbalizes understanding.

## 2019-05-20 NOTE — ED Provider Notes (Signed)
[place]    CSN: [id number] Arrival date & time: 05/20/19  1626     History   Chief Complaint Chief Complaint  Patient presents with  . Appointment    4:30  . Medication Refill    HPI Kayla Hunt is a 68 y.o. female.   HPI  Patient with no PCP who has history of HTN, DM, CHF, hypothyroidism, gout, morbid obesity.  Noncompliant.  Has not had labs in over a year.  Last Cr was over 2.  Has had potassium measured as high as 6.8. Here wanting all meds refilled.  Uncertain when she got them last, or if she is taking correctly.  Many have expired. Chart indicates mild mental impairment.  She likely needs more supervision. Denies any symptoms or problems. [place] daughter brought her in.  Past Medical History:  Diagnosis Date  . Anemia   . Anxiety   . Arthritis    "legs" (04/12/2018)  . CHF (congestive heart failure) ([place])   . Chronic back pain   . Chronic edema    bilat LE's  . CKD (chronic kidney disease), stage IV ([place])    [name] 04/12/2018  . Depression   . Gout   . Hyperlipidemia   . Hypertension   . Hypothyroidism   . Seizures ([place]) 05/05/2011   [name] 05/05/2011  . Type II diabetes mellitus [facility])     Patient Active Problem List   Diagnosis Date Noted  . Hyperkalemia 04/11/2018  . Type II diabetes mellitus with renal manifestations ([place]) 03/11/2015  . Osteoarthritis of both knees 03/11/2015  . Dyslipidemia 10/02/2007  . MILD MENTAL RETARDATION 10/02/2007  . DEAFNESS, UNILATERAL 10/02/2007  . 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 CHRONIC KIDNEY DISEASE STAGE III (MODERATE) 11/27/2004  . Gout, unspecified 01/06/2003  . Hypothyroidism 05/28/1998  . Essential hypertension, benign 04/15/1998    Past Surgical History:  Procedure Laterality Date  . NO PAST SURGERIES      OB History   No obstetric history on file.      Home Medications    Prior to Admission medications   Medication Sig Start Date End Date Taking?  Authorizing Provider  furosemide (LASIX) 40 MG tablet Take 1 tablet (40 mg total) by mouth daily. 03/16/19  Yes Shelda Pal, DO  allopurinol (ZYLOPRIM) 100 MG tablet Take 1 tablet (100 mg total) by mouth daily. 05/20/19 07/19/19  Raylene Everts, MD  glipiZIDE (GLUCOTROL) 5 MG tablet Take 1 tablet (5 mg total) by mouth 2 (two) times daily before a meal. 05/20/19   Raylene Everts, MD  hydrALAZINE (APRESOLINE) 50 MG tablet Take 1 tablet (50 mg total) by mouth 2 (two) times a day. 05/20/19   Raylene Everts, MD  levothyroxine (SYNTHROID) 112 MCG tablet Take 1 tablet (112 mcg total) by mouth daily. 05/20/19 05/19/20  Raylene Everts, MD  loratadine (CLARITIN) 10 MG tablet Take 10 mg by mouth daily as needed for allergies.    [provider]  metoprolol tartrate (LOPRESSOR) 50 MG tablet Take 1 tablet (50 mg total) by mouth 2 (two) times daily. 05/20/19 07/19/19  Raylene Everts, MD  simvastatin (ZOCOR) 20 MG tablet Take 1 tablet (20 mg total) by mouth daily. 05/20/19 07/19/19  Raylene Everts, MD    Family History Family History  Family history unknown: Yes    Social History Social History   Tobacco Use  . Smoking status: Never Smoker  . Smokeless tobacco: Current User  Types: Snuff  Substance Use Topics  . Alcohol use: No    Frequency: Never  . Drug use: No     Allergies   Patient has no known allergies.   Review of Systems Review of Systems  Constitutional: Negative for chills and fever.  HENT: Negative for ear pain and sore throat.   Eyes: Negative for pain and visual disturbance.  Respiratory: Negative for cough and shortness of breath.   Cardiovascular: Negative for chest pain and palpitations.  Gastrointestinal: Negative for abdominal pain and vomiting.  Genitourinary: Negative for dysuria and hematuria.  Musculoskeletal: Negative for arthralgias and back pain.  Skin: Negative for color change and rash.  Neurological: Negative for seizures  and syncope.  All other systems reviewed and are negative.    Physical Exam Triage Vital Signs ED Triage Vitals  Enc Vitals Group     BP 05/20/19 1717 (!) 188/51     Pulse Rate 05/20/19 1717 73     Resp 05/20/19 1717 17     Temp 05/20/19 1717 98.7 F (37.1 C)     Temp Source 05/20/19 1717 Oral     SpO2 05/20/19 1717 99 %     Weight --      Height --      Head Circumference --      Peak Flow --      Pain Score 05/20/19 1716 0     Pain Loc --      Pain Edu? --      Excl. in [place]? --    No data found.  Updated Vital Signs BP (!) 188/51 (BP Location: Left Arm)   Pulse 73   Temp 98.7 F (37.1 C) (Oral)   Resp 17   SpO2 99%       Physical Exam Constitutional:      General: She is not in acute distress.    Appearance: She is well-developed. She is obese.     Comments: In wheelchair  HENT:     Head: Normocephalic and atraumatic.  Eyes:     Conjunctiva/sclera: Conjunctivae normal.     Pupils: Pupils are equal, round, and reactive to light.  Neck:     Musculoskeletal: Normal range of motion.  Cardiovascular:     Rate and Rhythm: Normal rate and regular rhythm.     Comments: Heart sounds faint Pulmonary:     Effort: Pulmonary effort is normal. No respiratory distress.     Comments: Diminished BS in bases Abdominal:     General: There is no distension.     Palpations: Abdomen is soft.     Comments: Large pannus  Musculoskeletal: Normal range of motion.     Right lower leg: Edema present.  Skin:    General: Skin is warm and dry.  Neurological:     Mental Status: She is alert.  Psychiatric:     Comments: Poor judgement and fund of knowledge.    Results for SAPHIRE, BARNHART (MRN [id number]) as of 05/20/2019 18:31  Ref. Range 05/20/2019 17:34  Sodium Latest Ref Range: 135 - 145 mmol/L 138  Potassium Latest Ref Range: 3.5 - 5.1 mmol/L 4.3  Chloride Latest Ref Range: 98 - 111 mmol/L 101  CO2 Latest Ref Range: 22 - 32 mmol/L 28  Glucose Latest Ref Range: 70 - 99  mg/dL 167 (H)  BUN Latest Ref Range: 8 - 23 mg/dL 31 (H)  Creatinine Latest Ref Range: 0.44 - 1.00 mg/dL 1.72 (H)  Calcium Latest Ref Range: 8.9 -  10.3 mg/dL 8.9  Anion gap Latest Ref Range: 5 - 15  9  Alkaline Phosphatase Latest Ref Range: 38 - 126 U/L 85  Albumin Latest Ref Range: 3.5 - 5.0 g/dL 2.8 (L)  AST Latest Ref Range: 15 - 41 U/L 22  ALT Latest Ref Range: 0 - 44 U/L 18  Total Protein Latest Ref Range: 6.5 - 8.1 g/dL 6.7  Total Bilirubin Latest Ref Range: 0.3 - 1.2 mg/dL 0.2 (L)  GFR, Est Non African American Latest Ref Range: >60 mL/min 31 (L)  GFR, Est African American Latest Ref Range: >60 mL/min 36 (L)  WBC Latest Ref Range: 4.0 - 10.5 K/uL 6.0  RBC Latest Ref Range: 3.87 - 5.11 MIL/uL 4.41  Hemoglobin Latest Ref Range: 12.0 - 15.0 g/dL 13.0  HCT Latest Ref Range: 36.0 - 46.0 % 41.3  MCV Latest Ref Range: 80.0 - 100.0 fL 93.7  MCH Latest Ref Range: 26.0 - 34.0 pg 29.5  MCHC Latest Ref Range: 30.0 - 36.0 g/dL 31.5  RDW Latest Ref Range: 11.5 - 15.5 % 16.8 (H)  Platelets Latest Ref Range: 150 - 400 K/uL 219  nRBC Latest Ref Range: 0.0 - 0.2 % 0.0    UC Treatments / Results  Labs (all labs ordered are listed, but only abnormal results are displayed) Labs Reviewed  COMPREHENSIVE METABOLIC PANEL - Abnormal; Notable for the following components:      Result Value   Glucose, Bld 167 (*)    BUN 31 (*)    Creatinine, Ser 1.72 (*)    Albumin 2.8 (*)    Total Bilirubin 0.2 (*)    GFR calc non Af Amer 31 (*)    GFR calc Af Amer 36 (*)    All other components within normal limits  CBC - Abnormal; Notable for the following components:   RDW 16.8 (*)    All other components within normal limits    EKG   Radiology No results found.  Procedures Procedures (including critical care time)  Medications Ordered in UC Medications - No data to display  Initial Impression / Assessment and Plan / UC Course  I have reviewed the triage vital signs and the nursing notes.   Pertinent labs & imaging results that were available during my care of the patient were reviewed by me and considered in my medical decision making (see chart for details).     I made an effort to emphasize to the patient the importance of having a primary care doctor, and regular follow-up.  I told her she needs to be going in at least once every 3 months for lab work, blood pressure check, and for medication management.  She also needs preventative medicine.  I gave her this information her after visit summary and highlighted in internal medicine office locally that is taking new patients. Final Clinical Impressions(s) / UC Diagnoses   Final diagnoses:  Medication refill  Dyslipidemia  Hypothyroidism, unspecified type  Type 2 diabetes mellitus with stage 4 chronic kidney disease, without long-term current use of insulin (HCC)  Essential hypertension, benign     Discharge Instructions     IMPORTANT: You MUST call tomorrow to get an appointment with a primary care doctor It is not safe to continue to get medicine refills though urgent/emergency rooms You need regular visits and lab testing to monitor your health conditions You need preventative medical care  I am giving you one month of medicine until you can see your new doctor  ED Prescriptions    Medication Sig Dispense Auth. Provider   allopurinol (ZYLOPRIM) 100 MG tablet Take 1 tablet (100 mg total) by mouth daily. 30 tablet Raylene Everts, MD   glipiZIDE (GLUCOTROL) 5 MG tablet Take 1 tablet (5 mg total) by mouth 2 (two) times daily before a meal. 60 tablet Raylene Everts, MD   hydrALAZINE (APRESOLINE) 50 MG tablet Take 1 tablet (50 mg total) by mouth 2 (two) times a day. 60 tablet Raylene Everts, MD   levothyroxine (SYNTHROID) 112 MCG tablet Take 1 tablet (112 mcg total) by mouth daily. 30 tablet Raylene Everts, MD   metoprolol tartrate (LOPRESSOR) 50 MG tablet Take 1 tablet (50 mg total) by mouth 2 (two)  times daily. 60 tablet Raylene Everts, MD   simvastatin (ZOCOR) 20 MG tablet Take 1 tablet (20 mg total) by mouth daily. 30 tablet Raylene Everts, MD     Controlled Substance Prescriptions [place] Controlled Substance Registry consulted? Not Applicable   Raylene Everts, MD 05/20/19 [phone]

## 2019-05-20 NOTE — ED Notes (Signed)
No answer from lobby x 2 

## 2019-07-04 ENCOUNTER — Telehealth (INDEPENDENT_AMBULATORY_CARE_PROVIDER_SITE_OTHER): Payer: Self-pay

## 2019-07-04 NOTE — Telephone Encounter (Signed)
Patient daughter called to see if new pcp could prescribe her mother medication. Patient will establish care on 07-15-19 @ 10:50am. Patient has been going to the ed to get her medication refill due to lack of primary care. Patient needs all her medication except for Levothyroxine.  Patient uses [facility] on [address]   Please advice [phone]  Thank you Whitney Post

## 2019-07-05 NOTE — Telephone Encounter (Signed)
Spoke with patient granddaughter who stated she is wondering if office could fill patient medications before her initial appt. Informed patient granddaughter Kayla Hunt that due to patient not being a current pt, our office is not able to refill her medication. Per Mrs. Kayla Hunt, she will take patient to the ED until her appt with office.

## 2019-07-15 ENCOUNTER — Other Ambulatory Visit: Payer: Self-pay

## 2019-07-15 ENCOUNTER — Encounter (INDEPENDENT_AMBULATORY_CARE_PROVIDER_SITE_OTHER): Payer: Self-pay | Admitting: Primary Care

## 2019-07-15 ENCOUNTER — Ambulatory Visit (INDEPENDENT_AMBULATORY_CARE_PROVIDER_SITE_OTHER): Payer: Medicare Other | Admitting: Primary Care

## 2019-07-15 VITALS — BP 158/81 | HR 91 | Temp 97.3°F | Ht 64.0 in | Wt 270.2 lb

## 2019-07-15 DIAGNOSIS — I10 Essential (primary) hypertension: Secondary | ICD-10-CM

## 2019-07-15 DIAGNOSIS — E119 Type 2 diabetes mellitus without complications: Secondary | ICD-10-CM

## 2019-07-15 DIAGNOSIS — F70 Mild intellectual disabilities: Secondary | ICD-10-CM | POA: Diagnosis not present

## 2019-07-15 DIAGNOSIS — Z131 Encounter for screening for diabetes mellitus: Secondary | ICD-10-CM

## 2019-07-15 DIAGNOSIS — Z6841 Body Mass Index (BMI) 40.0 and over, adult: Secondary | ICD-10-CM

## 2019-07-15 DIAGNOSIS — I890 Lymphedema, not elsewhere classified: Secondary | ICD-10-CM

## 2019-07-15 DIAGNOSIS — E039 Hypothyroidism, unspecified: Secondary | ICD-10-CM | POA: Diagnosis not present

## 2019-07-15 DIAGNOSIS — M109 Gout, unspecified: Secondary | ICD-10-CM

## 2019-07-15 DIAGNOSIS — Z7689 Persons encountering health services in other specified circumstances: Secondary | ICD-10-CM

## 2019-07-15 LAB — POCT GLYCOSYLATED HEMOGLOBIN (HGB A1C): Hemoglobin A1C: 6.1 % — AB (ref 4.0–5.6)

## 2019-07-15 MED ORDER — SIMVASTATIN 20 MG PO TABS: 20.0000 mg | ORAL_TABLET | Freq: Every day | ORAL | 0 refills | Status: DC

## 2019-07-15 MED ORDER — METOPROLOL TARTRATE 50 MG PO TABS: 50.0000 mg | ORAL_TABLET | Freq: Two times a day (BID) | ORAL | 0 refills | Status: DC

## 2019-07-15 MED ORDER — ALLOPURINOL 100 MG PO TABS: 100.0000 mg | ORAL_TABLET | Freq: Every day | ORAL | 0 refills | Status: DC

## 2019-07-15 MED ORDER — LEVOTHYROXINE SODIUM 112 MCG PO TABS: 112.0000 ug | ORAL_TABLET | Freq: Every day | ORAL | 0 refills | Status: DC

## 2019-07-15 MED ORDER — FUROSEMIDE 40 MG PO TABS
40.0000 mg | ORAL_TABLET | Freq: Every day | ORAL | 1 refills | Status: DC
Start: 2019-07-15 — End: 2019-08-20

## 2019-07-15 MED ORDER — HYDRALAZINE HCL 50 MG PO TABS: 50.0000 mg | ORAL_TABLET | Freq: Two times a day (BID) | ORAL | 3 refills | Status: DC

## 2019-07-15 MED ORDER — GLIPIZIDE 5 MG PO TABS: 5.0000 mg | ORAL_TABLET | Freq: Two times a day (BID) | ORAL | 0 refills | Status: DC

## 2019-07-15 NOTE — Progress Notes (Signed)
New Patient Office Visit  Subjective:  Patient ID: Kayla Hunt, female    DOB: 19-Apr-1951  Age: 68 y.o. MRN: [id number]  CC:  Chief Complaint  Patient presents with  . New Patient (Initial Visit)    HPI Kayla Hunt presents for to establish care and requesting medication refills. Patient denies  headaches, chest pain or lower extremity edema, she does have shortness of breath with exertion.   Past Medical History:  Diagnosis Date  . Anemia   . Anxiety   . Arthritis    "legs" (04/12/2018)  . CHF (congestive heart failure) ([place])   . Chronic back pain   . Chronic edema    bilat LE's  . CKD (chronic kidney disease), stage IV ([place])    [name] 04/12/2018  . Depression   . Gout   . Hyperlipidemia   . Hypertension   . Hypothyroidism   . Seizures ([place]) 05/05/2011   [name] 05/05/2011  . Type II diabetes mellitus ([place])     Past Surgical History:  Procedure Laterality Date  . NO PAST SURGERIES      Family History  Family history unknown: Yes    Social History   Socioeconomic History  . Marital status: Single    Spouse name: Not on file  . Number of children: Not on file  . Years of education: Not on file  . Highest education level: Not on file  Occupational History  . Not on file  Social Needs  . Financial resource strain: Not on file  . Food insecurity    Worry: Not on file    Inability: Not on file  . Transportation needs    Medical: Not on file    Non-medical: Not on file  Tobacco Use  . Smoking status: Never Smoker  . Smokeless tobacco: Current User    Types: Snuff  Substance and Sexual Activity  . Alcohol use: No    Frequency: Never  . Drug use: No  . Sexual activity: Not on file  Lifestyle  . Physical activity    Days per week: Not on file    Minutes per session: Not on file  . Stress: Not on file  Relationships  . Social Herbalist on phone: Not on file    Gets together: Not on file    Attends religious service: Not on file   Active member of club or organization: Not on file    Attends meetings of clubs or organizations: Not on file    Relationship status: Not on file  . Intimate partner violence    Fear of current or ex partner: Not on file    Emotionally abused: Not on file    Physically abused: Not on file    Forced sexual activity: Not on file  Other Topics Concern  . Not on file  Social History Narrative  . Not on file    ROS Review of Systems  All other systems reviewed and are negative.   Objective:   Today's Vitals: BP (!) 158/81 (BP Location: Left Arm, Patient Position: Sitting, Cuff Size: Large)   Pulse 91   Temp (!) 97.3 F (36.3 C) (Tympanic)   Ht _0  (1.626 m)   Wt 270 lb 3.2 oz (122.6 kg)   SpO2 95%   BMI 46.38 kg/m   Physical Exam Constitutional:      Appearance: Normal appearance. She is obese.  HENT:     Head: Normocephalic.  Right Ear: Tympanic membrane normal.     Left Ear: Tympanic membrane normal.  Eyes:     Extraocular Movements: Extraocular movements intact.     Pupils: Pupils are equal, round, and reactive to light.  Neck:     Musculoskeletal: Normal range of motion and neck supple.  Cardiovascular:     Rate and Rhythm: Normal rate and regular rhythm.  Pulmonary:     Effort: Pulmonary effort is normal.     Breath sounds: Normal breath sounds.  Abdominal:     General: Bowel sounds are normal. There is distension.     Palpations: Abdomen is soft.  Musculoskeletal: Normal range of motion.  Skin:    General: Skin is warm and dry.  Neurological:     General: No focal deficit present.     Mental Status: She is alert and oriented to person, place, and time.  Psychiatric:        Mood and Affect: Mood normal.        Behavior: Behavior normal.     Assessment & Plan:  Kayla Hunt was seen today for new patient (initial visit).  Diagnoses and all orders for this visit:  Screening for diabetes mellitus A1C POCT completed  Type 2 diabetes mellitus without  complication, without long-term current use of insulin (HCC)  Your A1C is a measure of your sugar over the past 3 months and is not affected by what you have eaten over the past few days. Diabetes increases your chances of stroke and heart attack over 300 % and is the leading cause of blindness and kidney failure in the [place]. Please make sure you decrease bad carbs like white bread, white rice, potatoes, corn, soft drinks, pasta, cereals, refined sugars, sweet tea, dried fruits, and fruit juice. Good carbs are okay to eat in moderation like sweet potatoes, brown rice, whole grain pasta/bread, most fruit (except dried fruit) and you can eat as many veggies as you want.   Greater than 6.5 is considered diabetic. Between 6.4 and 5.7 is prediabetic If your A1C is less than 5.7 you are NOT diabetic.  Targets for Glucose Readings: Time of Check Target for patients WITHOUT Diabetes Target for DIABETICS  Before Meals Less than 100  less than 150  Two hours after meals Less than 200  Less than 250   -     HgB A1c 6.1  -     CBC with Differential -     CMP14+EGFR -     Lipid Panel  MILD MENTAL RETARDATION Capable of performing ADL's needs IDAL's   Essential hypertension, benign Counseled on blood pressure goal of less than 130/80, low-sodium, DASH diet, medication compliance, 150 minutes of moderate intensity exercise per week. Discussed medication compliance, adverse effects. -     CBC with Differential -     CMP14+EGFR  Hypothyroidism, unspecified type Hypothyroidism can cause slow metabolism, fatigue, weight gain, dry hair/skin, constipation, swelling, decreased memory/brain fog. We are going to start you on a new medication for thyroid. Please take it on an empty stomach 1mns-1 hour before food to have the most effective absorption. I will be sending in levothyroxine   Mc[place]. Recheck lab only in one month.   -     TSH + free T4  Encounter to establish care MJuluis Mire NP-C  will be your  (PCP) that will  provides both the first contact for a person with an undiagnosed health concern as well as continuing care of varied medical conditions,  not limited by cause, organ system, or diagnosis.  -     CBC with Differential -     CMP14+EGFR -     Lipid Panel -     TSH + free T4  Morbid obesity (HCC) Morbid Obesity  BMI 46 is indicating an excess in caloric intake or underlining conditions. This may lead to other co-morbidities. Lifestyle modifications of diet and exercise may reduce obesity.  -     Lipid Panel Hypothyroidism, unspecified type Hypothyroidism can cause slow metabolism, fatigue, weight gain, dry hair/skin, constipation, swelling, decreased memory/brain fog. We are going to start you on a new medication for thyroid. Please take it on an empty stomach 50mns-1 hour before food to have the most effective absorption. I will be sending in levothyroxine   Mcg. Recheck lab only in one month. -     TSH + free T4  Encounter to establish care MJuluis Mire NP-C will be your  (PCP) that will  provides both the first contact for a person with an undiagnosed health concern as well as continuing care of varied medical conditions, not limited by cause, organ system, or diagnosis.  -     CBC with Differential -     CMP14+EGFR -     Lipid Panel -     TSH + free T4  Morbid obesity (HCC) Obesity is 46  indicating an excess in caloric intake or underlining conditions. This may lead to other co-morbidities. Lifestyle modifications of diet and exercise may reduce obesity.  -     Lipid Panel  Gout, unspecified cause, unspecified chronicity, unspecified site Gout is an inflammatory arthritis related to a hyperuricemia.  Risk factors greater than 40, female gender, increase in purines red meats and seafoods, alcohol smoking, and obesity greater than 30 -     Uric Acid  Encounter for diabetic foot exam ([place] Sensory exam of the foot is normal, tested with the monofilament.  Good pulses, no lesions or ulcers, good peripheral pulses.  Lymphedema Lymphedema localized swelling caused by compromise lymphatic system.  Accumulation of lymphatic fluid in the intercuticular since causing swelling.    Other orders -     levothyroxine (SYNTHROID) 112 MCG tablet; Take 1 tablet (112 mcg total) by mouth daily. -     allopurinol (ZYLOPRIM) 100 MG tablet; Take 1 tablet (100 mg total) by mouth daily. -     simvastatin (ZOCOR) 20 MG tablet; Take 1 tablet (20 mg total) by mouth daily. -     furosemide (LASIX) 40 MG tablet; Take 1 tablet (40 mg total) by mouth daily. -     glipiZIDE (GLUCOTROL) 5 MG tablet; Take 1 tablet (5 mg total) by mouth 2 (two) times daily before a meal. -     hydrALAZINE (APRESOLINE) 50 MG tablet; Take 1 tablet (50 mg total) by mouth 2 (two) times daily. -     metoprolol tartrate (LOPRESSOR) 50 MG tablet; Take 1 tablet (50 mg total) by mouth 2 (two) times daily.     Follow-up: Return in about 6 weeks (around 08/26/2019) for Bp and edema follow up.   MKerin Perna NP

## 2019-07-15 NOTE — Patient Instructions (Signed)
Edema  Edema is when you have too much fluid in your body or under your skin. Edema may make your legs, feet, and ankles swell up. Swelling is also common in looser tissues, like around your eyes. This is a common condition. It gets more common as you get older. There are many possible causes of edema. Eating too much salt (sodium) and being on your feet or sitting for a long time can cause edema in your legs, feet, and ankles. Hot weather may make edema worse. Edema is usually painless. Your skin may look swollen or shiny. Follow these instructions at home:  Keep the swollen body part raised (elevated) above the level of your heart when you are sitting or lying down.  Do not sit still or stand for a long time.  Do not wear tight clothes. Do not wear garters on your upper legs.  Exercise your legs. This can help the swelling go down.  Wear elastic bandages or support stockings as told by your doctor.  Eat a low-salt (low-sodium) diet to reduce fluid as told by your doctor.  Depending on the cause of your swelling, you may need to limit how much fluid you drink (fluid restriction).  Take over-the-counter and prescription medicines only as told by your doctor. Contact a doctor if:  Treatment is not working.  You have heart, liver, or kidney disease and have symptoms of edema.  You have sudden and unexplained weight gain. Get help right away if:  You have shortness of breath or chest pain.  You cannot breathe when you lie down.  You have pain, redness, or warmth in the swollen areas.  You have heart, liver, or kidney disease and get edema all of a sudden.  You have a fever and your symptoms get worse all of a sudden. Summary  Edema is when you have too much fluid in your body or under your skin.  Edema may make your legs, feet, and ankles swell up. Swelling is also common in looser tissues, like around your eyes.  Raise (elevate) the swollen body part above the level of your  heart when you are sitting or lying down.  Follow your doctor's instructions about diet and how much fluid you can drink (fluid restriction). This information is not intended to replace advice given to you by your health care provider. Make sure you discuss any questions you have with your health care provider. Document Released: 04/04/2008 Document Revised: 10/20/2017 Document Reviewed: 11/04/2016 Elsevier Patient Education  2020 Elsevier Inc.  

## 2019-07-16 ENCOUNTER — Other Ambulatory Visit (INDEPENDENT_AMBULATORY_CARE_PROVIDER_SITE_OTHER): Payer: Self-pay | Admitting: Primary Care

## 2019-07-16 LAB — LIPID PANEL
Chol/HDL Ratio: 6.1 ratio — ABNORMAL HIGH (ref 0.0–4.4)
Cholesterol, Total: 370 mg/dL — ABNORMAL HIGH (ref 100–199)
HDL: 61 mg/dL (ref >39)
LDL Chol Calc (NIH): 296 mg/dL — ABNORMAL HIGH (ref 0–99)
Triglycerides: 86 mg/dL (ref 0–149)
VLDL Cholesterol Cal: 13 mg/dL (ref 5–40)

## 2019-07-16 LAB — CBC WITH DIFFERENTIAL/PLATELET
Basophils Absolute: 0 10*3/uL (ref 0.0–0.2)
Basos: 1 %
EOS (ABSOLUTE): 0.1 10*3/uL (ref 0.0–0.4)
Eos: 2 %
Hematocrit: 39.3 % (ref 34.0–46.6)
Hemoglobin: 12.8 g/dL (ref 11.1–15.9)
Immature Grans (Abs): 0 10*3/uL (ref 0.0–0.1)
Immature Granulocytes: 0 %
Lymphocytes Absolute: 1.2 10*3/uL (ref 0.7–3.1)
Lymphs: 23 %
MCH: 29.2 pg (ref 26.6–33.0)
MCHC: 32.6 g/dL (ref 31.5–35.7)
MCV: 90 fL (ref 79–97)
Monocytes Absolute: 0.4 10*3/uL (ref 0.1–0.9)
Monocytes: 8 %
Neutrophils Absolute: 3.3 10*3/uL (ref 1.4–7.0)
Neutrophils: 66 %
Platelets: 264 10*3/uL (ref 150–450)
RBC: 4.38 x10E6/uL (ref 3.77–5.28)
RDW: 13.9 % (ref 11.7–15.4)
WBC: 5 10*3/uL (ref 3.4–10.8)

## 2019-07-16 LAB — CMP14+EGFR
ALT: 7 IU/L (ref 0–32)
AST: 17 IU/L (ref 0–40)
Albumin/Globulin Ratio: 1.1 — ABNORMAL LOW (ref 1.2–2.2)
Albumin: 3.5 g/dL — ABNORMAL LOW (ref 3.8–4.8)
Alkaline Phosphatase: 85 IU/L (ref 39–117)
BUN/Creatinine Ratio: 18 (ref 12–28)
BUN: 32 mg/dL — ABNORMAL HIGH (ref 8–27)
Bilirubin Total: <0.2 mg/dL (ref 0.0–1.2)
CO2: 25 mmol/L (ref 20–29)
Calcium: 9.2 mg/dL (ref 8.7–10.3)
Chloride: 103 mmol/L (ref 96–106)
Creatinine, Ser: 1.78 mg/dL — ABNORMAL HIGH (ref 0.57–1.00)
GFR calc Af Amer: 34 mL/min/{1.73_m2} — ABNORMAL LOW (ref >59)
GFR calc non Af Amer: 30 mL/min/{1.73_m2} — ABNORMAL LOW (ref >59)
Globulin, Total: 3.2 g/dL (ref 1.5–4.5)
Glucose: 83 mg/dL (ref 65–99)
Potassium: 4.8 mmol/L (ref 3.5–5.2)
Sodium: 142 mmol/L (ref 134–144)
Total Protein: 6.7 g/dL (ref 6.0–8.5)

## 2019-07-16 LAB — URIC ACID: Uric Acid: 5.9 mg/dL (ref 2.5–7.1)

## 2019-07-16 LAB — TSH+FREE T4
Free T4: 1.22 ng/dL (ref 0.82–1.77)
TSH: 1.58 u[IU]/mL (ref 0.450–4.500)

## 2019-07-16 MED ORDER — SIMVASTATIN 40 MG PO TABS: 20.0000 mg | ORAL_TABLET | Freq: Every day | ORAL | 3 refills | Status: DC

## 2019-07-17 ENCOUNTER — Other Ambulatory Visit (INDEPENDENT_AMBULATORY_CARE_PROVIDER_SITE_OTHER): Payer: Self-pay | Admitting: Primary Care

## 2019-07-17 DIAGNOSIS — S37009A Unspecified injury of unspecified kidney, initial encounter: Secondary | ICD-10-CM

## 2019-07-23 ENCOUNTER — Telehealth (INDEPENDENT_AMBULATORY_CARE_PROVIDER_SITE_OTHER): Payer: Self-pay

## 2019-07-23 NOTE — Telephone Encounter (Signed)
-----   Message from Kerin Perna, NP sent at 07/17/2019  9:35 AM EDT ----- Kidney function has decline and will refer her to nephrology for evaluation and treatment/ Your LDL is not in range. Your LDL is the bad cholesterol that can lead to heart attack and stroke. To lower your number you can decrease your fatty foods, red meat, cheese, milk and increase fiber like whole grains and veggies. You can also add a fiber supplement like Metamucil or Benefiber. Take her zocor 40mg  every night. Other labs wnl

## 2019-07-23 NOTE — Telephone Encounter (Signed)
Spoke with patients granddaughter and provided lab results per PCP. Also provided number to [facility] for them to call and schedule nephrology appointment. Nat Christen, CMA

## 2019-08-08 ENCOUNTER — Other Ambulatory Visit (INDEPENDENT_AMBULATORY_CARE_PROVIDER_SITE_OTHER): Payer: Self-pay | Admitting: Primary Care

## 2019-08-08 MED ORDER — LEVOTHYROXINE SODIUM 112 MCG PO TABS: 112.0000 ug | ORAL_TABLET | Freq: Every day | ORAL | 0 refills | Status: DC

## 2019-08-19 ENCOUNTER — Ambulatory Visit (INDEPENDENT_AMBULATORY_CARE_PROVIDER_SITE_OTHER): Payer: Medicare Other | Admitting: Primary Care

## 2019-08-20 ENCOUNTER — Ambulatory Visit (INDEPENDENT_AMBULATORY_CARE_PROVIDER_SITE_OTHER): Payer: Medicare Other | Admitting: Primary Care

## 2019-08-20 ENCOUNTER — Other Ambulatory Visit: Payer: Self-pay

## 2019-08-20 ENCOUNTER — Encounter (INDEPENDENT_AMBULATORY_CARE_PROVIDER_SITE_OTHER): Payer: Self-pay | Admitting: Primary Care

## 2019-08-20 VITALS — BP 178/83 | HR 77 | Temp 97.5°F | Ht 64.0 in

## 2019-08-20 DIAGNOSIS — Z23 Encounter for immunization: Secondary | ICD-10-CM

## 2019-08-20 DIAGNOSIS — I10 Essential (primary) hypertension: Secondary | ICD-10-CM

## 2019-08-20 DIAGNOSIS — E1122 Type 2 diabetes mellitus with diabetic chronic kidney disease: Secondary | ICD-10-CM

## 2019-08-20 DIAGNOSIS — N184 Chronic kidney disease, stage 4 (severe): Secondary | ICD-10-CM

## 2019-08-20 DIAGNOSIS — R413 Other amnesia: Secondary | ICD-10-CM | POA: Diagnosis not present

## 2019-08-20 DIAGNOSIS — T148XXA Other injury of unspecified body region, initial encounter: Secondary | ICD-10-CM

## 2019-08-20 DIAGNOSIS — S60222A Contusion of left hand, initial encounter: Secondary | ICD-10-CM

## 2019-08-20 MED ORDER — SIMVASTATIN 40 MG PO TABS: 20.0000 mg | ORAL_TABLET | Freq: Every day | ORAL | 3 refills | Status: DC

## 2019-08-20 MED ORDER — FUROSEMIDE 40 MG PO TABS: 40.0000 mg | ORAL_TABLET | Freq: Every day | ORAL | 1 refills | Status: DC

## 2019-08-20 MED ORDER — GLIPIZIDE 5 MG PO TABS: 5.0000 mg | ORAL_TABLET | Freq: Two times a day (BID) | ORAL | 3 refills | Status: DC

## 2019-08-20 MED ORDER — METOPROLOL TARTRATE 50 MG PO TABS: 50.0000 mg | ORAL_TABLET | Freq: Two times a day (BID) | ORAL | 3 refills | Status: DC

## 2019-08-20 MED ORDER — HYDRALAZINE HCL 50 MG PO TABS: 50.0000 mg | ORAL_TABLET | Freq: Two times a day (BID) | ORAL | 3 refills | Status: DC

## 2019-08-20 NOTE — Progress Notes (Signed)
Established Patient Office Visit  Subjective:  Patient ID: Kayla Hunt, female    DOB: 05/29/1951  Age: 68 y.o. MRN: [id number]  CC:  Chief Complaint  Patient presents with  . Blood Pressure Check  . Edema  . Referral    home health    HPI [name] presents for blood pressure re-evaluation, she is unable to tell me if she took her medication or the names . There is a large bruise on her right arm not sure how she got it maybe the bed. She needs assistance with transfers she states she can use the bathroom by herself..  Past Medical History:  Diagnosis Date  . Anemia   . Anxiety   . Arthritis    "legs" (04/12/2018)  . CHF (congestive heart failure) ([place])   . Chronic back pain   . Chronic edema    bilat LE's  . CKD (chronic kidney disease), stage IV ([place])    [name] 04/12/2018  . Depression   . Gout   . Hyperlipidemia   . Hypertension   . Hypothyroidism   . Seizures ([place]) 05/05/2011   [name] 05/05/2011  . Type II diabetes mellitus ([place])     Past Surgical History:  Procedure Laterality Date  . NO PAST SURGERIES      Family History  Family history unknown: Yes    Social History   Socioeconomic History  . Marital status: Single    Spouse name: Not on file  . Number of children: Not on file  . Years of education: Not on file  . Highest education level: Not on file  Occupational History  . Not on file  Social Needs  . Financial resource strain: Not on file  . Food insecurity    Worry: Not on file    Inability: Not on file  . Transportation needs    Medical: Not on file    Non-medical: Not on file  Tobacco Use  . Smoking status: Never Smoker  . Smokeless tobacco: Current User    Types: Snuff  Substance and Sexual Activity  . Alcohol use: No    Frequency: Never  . Drug use: No  . Sexual activity: Not on file  Lifestyle  . Physical activity    Days per week: Not on file    Minutes per session: Not on file  . Stress: Not on file   Relationships  . Social Herbalist on phone: Not on file    Gets together: Not on file    Attends religious service: Not on file    Active member of club or organization: Not on file    Attends meetings of clubs or organizations: Not on file    Relationship status: Not on file  . Intimate partner violence    Fear of current or ex partner: Not on file    Emotionally abused: Not on file    Physically abused: Not on file    Forced sexual activity: Not on file  Other Topics Concern  . Not on file  Social History Narrative  . Not on file    Outpatient Medications Prior to Visit  Medication Sig Dispense Refill  . allopurinol (ZYLOPRIM) 100 MG tablet Take 1 tablet (100 mg total) by mouth daily. 30 tablet 0  . levothyroxine (SYNTHROID) 112 MCG tablet Take 1 tablet (112 mcg total) by mouth daily. 90 tablet 0  . loratadine (CLARITIN) 10 MG tablet Take 10 mg by mouth daily  as needed for allergies.    . furosemide (LASIX) 40 MG tablet Take 1 tablet (40 mg total) by mouth daily. 30 tablet 1  . glipiZIDE (GLUCOTROL) 5 MG tablet Take 1 tablet (5 mg total) by mouth 2 (two) times daily before a meal. 60 tablet 0  . hydrALAZINE (APRESOLINE) 50 MG tablet Take 1 tablet (50 mg total) by mouth 2 (two) times daily. 60 tablet 3  . metoprolol tartrate (LOPRESSOR) 50 MG tablet Take 1 tablet (50 mg total) by mouth 2 (two) times daily. 60 tablet 0  . simvastatin (ZOCOR) 40 MG tablet Take 0.5 tablets (20 mg total) by mouth daily. 30 tablet 3   No facility-administered medications prior to visit.     No Known Allergies  ROS Review of Systems  Respiratory: Positive for shortness of breath.   Genitourinary: Positive for urgency.  Skin:       bruises  Neurological: Positive for weakness.  Psychiatric/Behavioral: Positive for decreased concentration.  All other systems reviewed and are negative.     Objective:    Physical Exam  Constitutional: She appears well-developed and well-nourished.   Neck: Neck supple.  Cardiovascular: Normal rate and regular rhythm.  Pulmonary/Chest: Effort normal and breath sounds normal.  Abdominal: Soft. Bowel sounds are normal. She exhibits distension.  Musculoskeletal:        General: Edema present.  Neurological: She is alert.  Skin: There is erythema.  Psychiatric: She has a normal mood and affect.    BP (!) 178/83 (BP Location: Left Arm, Patient Position: Sitting, Cuff Size: Large)   Pulse 77   Temp (!) 97.5 F (36.4 C) (Temporal)   Ht 5\' 4"  (1.626 m)   SpO2 94%   BMI 46.38 kg/m  Wt Readings from Last 3 Encounters:  07/15/19 270 lb 3.2 oz (122.6 kg)  04/23/18 268 lb 15.4 oz (122 kg)  03/11/15 (!) 314 lb (142.4 kg)     Health Maintenance Due  Topic Date Due  . Hepatitis C Screening  09-14-1951  . FOOT EXAM  08/25/1963  . OPHTHALMOLOGY EXAM  08/25/1963  . TETANUS/TDAP  08/24/1972  . PAP SMEAR-Modifier  08/24/1974  . MAMMOGRAM  08/25/2003  . COLONOSCOPY  08/25/2003  . DEXA SCAN  08/24/2018  . PNA vac Low Risk Adult (1 of 2 - PCV13) 08/24/2018  . INFLUENZA VACCINE  06/01/2019    There are no preventive care reminders to display for this patient.  Lab Results  Component Value Date   TSH 1.580 07/15/2019   Lab Results  Component Value Date   WBC 5.0 07/15/2019   HGB 12.8 07/15/2019   HCT 39.3 07/15/2019   MCV 90 07/15/2019   PLT 264 07/15/2019   Lab Results  Component Value Date   NA 142 07/15/2019   K 4.8 07/15/2019   CO2 25 07/15/2019   GLUCOSE 83 07/15/2019   BUN 32 (H) 07/15/2019   CREATININE 1.78 (H) 07/15/2019   BILITOT <0.2 07/15/2019   ALKPHOS 85 07/15/2019   AST 17 07/15/2019   ALT 7 07/15/2019   PROT 6.7 07/15/2019   ALBUMIN 3.5 (L) 07/15/2019   CALCIUM 9.2 07/15/2019   ANIONGAP 9 05/20/2019   Lab Results  Component Value Date   CHOL 370 (H) 07/15/2019   Lab Results  Component Value Date   HDL 61 07/15/2019   Lab Results  Component Value Date   LDLCALC 296 (H) 07/15/2019   Lab  Results  Component Value Date   TRIG 86 07/15/2019  Lab Results  Component Value Date   CHOLHDL 6.1 (H) 07/15/2019   Lab Results  Component Value Date   HGBA1C 6.1 (A) 07/15/2019      Assessment & Plan:   Sabriah was seen today for blood pressure check, edema and referral.  Diagnoses and all orders for this visit:  Memory loss -     Ambulatory referral to [place] hypertension, benign Elevated at this visit patient is unable to tell me if she has taken her blood pressure medication or not. She is able to tell me colors of her pills.  Contacted Clinical nurse voiced concerns and she will work on getting home help services.    Type 2 diabetes mellitus with stage 4 chronic kidney disease, without long-term current use of insulin (HCC) A1C 6.1 diabetes well control but a decline in kidney function previously referred to nephrology.   Need for immunization against influenza -     Flu Vaccine QUAD 36+ mos IM  Other orders -     furosemide (LASIX) 40 MG tablet; Take 1 tablet (40 mg total) by mouth daily. -     glipiZIDE (GLUCOTROL) 5 MG tablet; Take 1 tablet (5 mg total) by mouth 2 (two) times daily before a meal. -     simvastatin (ZOCOR) 40 MG tablet; Take 0.5 tablets (20 mg total) by mouth daily. -     metoprolol tartrate (LOPRESSOR) 50 MG tablet; Take 1 tablet (50 mg total) by mouth 2 (two) times daily. -     hydrALAZINE (APRESOLINE) 50 MG tablet; Take 1 tablet (50 mg total) by mouth 2 (two) times daily.  Bruise      Meds ordered this encounter  Medications  . furosemide (LASIX) 40 MG tablet    Sig: Take 1 tablet (40 mg total) by mouth daily.    Dispense:  30 tablet    Refill:  1  . glipiZIDE (GLUCOTROL) 5 MG tablet    Sig: Take 1 tablet (5 mg total) by mouth 2 (two) times daily before a meal.    Dispense:  60 tablet    Refill:  3  . simvastatin (ZOCOR) 40 MG tablet    Sig: Take 0.5 tablets (20 mg total) by mouth daily.    Dispense:  30 tablet     Refill:  3  . metoprolol tartrate (LOPRESSOR) 50 MG tablet    Sig: Take 1 tablet (50 mg total) by mouth 2 (two) times daily.    Dispense:  60 tablet    Refill:  3  . hydrALAZINE (APRESOLINE) 50 MG tablet    Sig: Take 1 tablet (50 mg total) by mouth 2 (two) times daily.    Dispense:  60 tablet    Refill:  3    Follow-up: Return in about 3 months (around 11/20/2019) for Hypertension - HHN to call with Bp weekly.    Kerin Perna, NP

## 2019-08-20 NOTE — Patient Instructions (Signed)

## 2019-08-22 ENCOUNTER — Telehealth: Payer: Self-pay

## 2019-08-22 NOTE — Telephone Encounter (Signed)
Referral received for home health services.  Call placed to patient to inquire if she has a preference for home health agencies. Spoke to granddaughter, Artemio Aly, [place] on file to speak to [facility]. .  She said that they have no preference of agencies.   This CM explained the difference between [facility] and home health.  Referral for [facility] also to be placed.  Home health referral faxed to [facility].  Artemio Aly explained that her grandmother has medicare and medicaid.  This CM noted that only medicare part B is on file. She said that she would contact [employer] to inquire about part A

## 2019-08-27 ENCOUNTER — Other Ambulatory Visit (INDEPENDENT_AMBULATORY_CARE_PROVIDER_SITE_OTHER): Payer: Self-pay | Admitting: Primary Care

## 2019-08-27 MED ORDER — ALLOPURINOL 100 MG PO TABS: 100.0000 mg | ORAL_TABLET | Freq: Every day | ORAL | 2 refills | Status: DC

## 2019-08-28 ENCOUNTER — Telehealth: Payer: Self-pay

## 2019-08-28 NOTE — Telephone Encounter (Signed)
Opened in error . disregard

## 2019-08-29 ENCOUNTER — Telehealth: Payer: Self-pay

## 2019-08-29 NOTE — Telephone Encounter (Signed)
Call placed to [facility] to check on status of referral. Spoke to [place] who confirmed that patient had initial home visit yesterday.

## 2019-08-29 NOTE — Telephone Encounter (Signed)
Call to [facility] regarding the [facility] referral. Spoke to Tammy who stated that they received it today but they are not able to process it because the date of the face to face encounter on page 2 above the provider signature is not clear on the fax.  She is requesting that the referral be refaxed and noted that both pages need to be included when refaxed, not just page 2.

## 2019-08-30 NOTE — Telephone Encounter (Signed)
This was refaxed on yesterday. Nat Christen, CMA

## 2019-09-02 ENCOUNTER — Telehealth (INDEPENDENT_AMBULATORY_CARE_PROVIDER_SITE_OTHER): Payer: Self-pay

## 2019-09-02 NOTE — Telephone Encounter (Signed)
Donita, RN with [facility] called to request verbal orders for physical therapy once a week for one week, twice a week for two weeks and twice a week for one week. Also requested verbal orders for nursing once a week for nine weeks. CMA gave verbal order approval. Nat Christen, CMA

## 2019-09-05 ENCOUNTER — Telehealth (INDEPENDENT_AMBULATORY_CARE_PROVIDER_SITE_OTHER): Payer: Self-pay

## 2019-09-05 NOTE — Telephone Encounter (Signed)
Patients granddaughter called to request refill of diabetic testing supplies. She needs lancets and test strips for a true metrix glucometer sent to [facility] on [address] and [address]. Nat Christen, CMA

## 2019-09-06 ENCOUNTER — Telehealth (INDEPENDENT_AMBULATORY_CARE_PROVIDER_SITE_OTHER): Payer: Self-pay

## 2019-09-06 ENCOUNTER — Other Ambulatory Visit (INDEPENDENT_AMBULATORY_CARE_PROVIDER_SITE_OTHER): Payer: Self-pay | Admitting: Primary Care

## 2019-09-06 MED ORDER — GLUCOSE BLOOD VI STRP: ORAL_STRIP | 12 refills | Status: DC

## 2019-09-06 MED ORDER — ACCU-CHEK SOFT TOUCH LANCETS MISC: 12 refills | Status: DC

## 2019-09-06 NOTE — Telephone Encounter (Signed)
Fwd to PCP. Berna Gitto S Miracle Criado, CMA  

## 2019-09-06 NOTE — Telephone Encounter (Signed)
[facility] pharmacy called wanting to get clarification on what test strips and on how many time a day does ms.Gilden need to check her blood sugar.  Please advice pharmacy [phone]  Thank you Whitney Post

## 2019-09-09 ENCOUNTER — Other Ambulatory Visit (INDEPENDENT_AMBULATORY_CARE_PROVIDER_SITE_OTHER): Payer: Self-pay | Admitting: Primary Care

## 2019-09-09 MED ORDER — ACCU-CHEK SOFT TOUCH LANCETS MISC: 12 refills | Status: DC

## 2019-09-09 NOTE — Telephone Encounter (Signed)
Resubmitted request on oral agents once daily

## 2019-09-10 ENCOUNTER — Telehealth: Payer: Self-pay

## 2019-09-10 ENCOUNTER — Telehealth (INDEPENDENT_AMBULATORY_CARE_PROVIDER_SITE_OTHER): Payer: Self-pay

## 2019-09-10 NOTE — Telephone Encounter (Signed)
Call placed to [place], spoke to [place] who confirmed receipt of the [facility] referral and stated that they are ready to schedule an assessment.

## 2019-09-10 NOTE — Telephone Encounter (Signed)
Kelly [facility] therapist [facility] called to request verbal order for physical therapy. Claiborne Billings states it would be for 1x a week for 1 week 2x a week for 2 weeks and 1x a week for 4-6 weeks.  Please give Claiborne Billings verbal orders if adequate.   Kelly's phone number  [phone]

## 2019-09-11 ENCOUNTER — Other Ambulatory Visit (INDEPENDENT_AMBULATORY_CARE_PROVIDER_SITE_OTHER): Payer: Self-pay | Admitting: Primary Care

## 2019-09-11 MED ORDER — TRUEPLUS LANCETS 26G MISC: 1.0000 | Freq: Every day | 11 refills | Status: DC

## 2019-09-11 MED ORDER — GLUCOSE BLOOD VI STRP: ORAL_STRIP | 12 refills | Status: DC

## 2019-09-23 ENCOUNTER — Other Ambulatory Visit (INDEPENDENT_AMBULATORY_CARE_PROVIDER_SITE_OTHER): Payer: Self-pay | Admitting: Primary Care

## 2019-09-23 MED ORDER — ALLOPURINOL 100 MG PO TABS: 100.0000 mg | ORAL_TABLET | Freq: Every day | ORAL | 1 refills | Status: DC

## 2019-10-02 ENCOUNTER — Telehealth: Payer: Self-pay

## 2019-10-02 NOTE — Telephone Encounter (Signed)
Referral received for home health : SN,PT, HHA.    Call placed to [place], spoke to [place] who explained that the patient is still active with SN and PT.  The aide was seeing patient until 09/25/2019 but the nurse will assess need to reinstate the aide.    Referral had also been made for [facility]

## 2019-10-09 ENCOUNTER — Other Ambulatory Visit (INDEPENDENT_AMBULATORY_CARE_PROVIDER_SITE_OTHER): Payer: Self-pay | Admitting: Primary Care

## 2019-10-09 ENCOUNTER — Telehealth (INDEPENDENT_AMBULATORY_CARE_PROVIDER_SITE_OTHER): Payer: Self-pay

## 2019-10-09 MED ORDER — TRUE METRIX AIR GLUCOSE METER DEVI: 1.0000 | Freq: Three times a day (TID) | 0 refills | Status: DC | PRN

## 2019-10-09 NOTE — Telephone Encounter (Signed)
Called granddaughter no answer true Metrix sent

## 2019-10-09 NOTE — Telephone Encounter (Signed)
FWD to PCP. Aashvi Rezabek S Carthel Castille, CMA  

## 2019-10-09 NOTE — Telephone Encounter (Signed)
Patient called to make a medication refill for  A new glucose meter states that there is a coupon were her grandmother can get a newer version (True Metrix) and was advice that she needed a Rx from her PCP.  Patient uses [facility], [address] AT [address]   Please advice Lina Sayre (ggrandaughter) [phone]   Thank you Whitney Post

## 2019-10-28 ENCOUNTER — Telehealth (INDEPENDENT_AMBULATORY_CARE_PROVIDER_SITE_OTHER): Payer: Self-pay | Admitting: Primary Care

## 2019-10-28 NOTE — Telephone Encounter (Signed)
Physical Therapist Claiborne Billings [facility] called to request verbal orders -Physical therapy Once a week for 4 weeks  [phone] p

## 2019-10-28 NOTE — Telephone Encounter (Signed)
Verbal orders left on confidential voicemail of kelly.

## 2019-11-06 ENCOUNTER — Telehealth (INDEPENDENT_AMBULATORY_CARE_PROVIDER_SITE_OTHER): Payer: Self-pay

## 2019-11-06 NOTE — Telephone Encounter (Signed)
Patients grand daughter Artemio Aly called in regards to grandmother bruising out of no where, states that this time its only her right arm. Patients grand daughter does not know if this is contributed to any medication. Patient grand daughter will like a call back from PCP.   Please advice [phone]

## 2019-11-06 NOTE — Telephone Encounter (Signed)
FWD to PCP

## 2019-11-06 NOTE — Telephone Encounter (Signed)
Called granddaughter Artemio Aly reviewed medication no side effects of bleeding. Patients stopped BC and only taking tylenol 325mg   1 daily for pain a day she has a appointment 11/20/2019. Advised to take a picture and will get CMP/INR

## 2019-11-07 ENCOUNTER — Other Ambulatory Visit: Payer: Self-pay

## 2019-11-07 ENCOUNTER — Ambulatory Visit (HOSPITAL_COMMUNITY)
Admission: EM | Admit: 2019-11-07 | Discharge: 2019-11-07 | Disposition: A | Discharge status: Home / Self Care | Payer: Medicare Other | Attending: Urgent Care | Admitting: Urgent Care

## 2019-11-07 ENCOUNTER — Encounter (HOSPITAL_COMMUNITY): Payer: Self-pay | Admitting: Emergency Medicine

## 2019-11-07 DIAGNOSIS — I509 Heart failure, unspecified: Secondary | ICD-10-CM

## 2019-11-07 DIAGNOSIS — R58 Hemorrhage, not elsewhere classified: Secondary | ICD-10-CM | POA: Diagnosis not present

## 2019-11-07 DIAGNOSIS — N183 Chronic kidney disease, stage 3 unspecified: Secondary | ICD-10-CM | POA: Diagnosis present

## 2019-11-07 DIAGNOSIS — R609 Edema, unspecified: Secondary | ICD-10-CM

## 2019-11-07 DIAGNOSIS — M1990 Unspecified osteoarthritis, unspecified site: Secondary | ICD-10-CM | POA: Diagnosis present

## 2019-11-07 LAB — CBC
HCT: 30.8 % — ABNORMAL LOW (ref 36.0–46.0)
Hemoglobin: 9.6 g/dL — ABNORMAL LOW (ref 12.0–15.0)
MCH: 29.5 pg (ref 26.0–34.0)
MCHC: 31.2 g/dL (ref 30.0–36.0)
MCV: 94.8 fL (ref 80.0–100.0)
Platelets: 303 10*3/uL (ref 150–400)
RBC: 3.25 MIL/uL — ABNORMAL LOW (ref 3.87–5.11)
RDW: 14.1 % (ref 11.5–15.5)
WBC: 13.2 10*3/uL — ABNORMAL HIGH (ref 4.0–10.5)
nRBC: 0 % (ref 0.0–0.2)

## 2019-11-07 LAB — COMPREHENSIVE METABOLIC PANEL
ALT: 16 U/L (ref 0–44)
AST: 30 U/L (ref 15–41)
Albumin: 2.6 g/dL — ABNORMAL LOW (ref 3.5–5.0)
Alkaline Phosphatase: 70 U/L (ref 38–126)
Anion gap: 12 (ref 5–15)
BUN: 30 mg/dL — ABNORMAL HIGH (ref 8–23)
CO2: 29 mmol/L (ref 22–32)
Calcium: 8.8 mg/dL — ABNORMAL LOW (ref 8.9–10.3)
Chloride: 98 mmol/L (ref 98–111)
Creatinine, Ser: 1.81 mg/dL — ABNORMAL HIGH (ref 0.44–1.00)
GFR calc Af Amer: 33 mL/min — ABNORMAL LOW (ref >60)
GFR calc non Af Amer: 29 mL/min — ABNORMAL LOW (ref >60)
Glucose, Bld: 146 mg/dL — ABNORMAL HIGH (ref 70–99)
Potassium: 4 mmol/L (ref 3.5–5.1)
Sodium: 139 mmol/L (ref 135–145)
Total Bilirubin: 1.2 mg/dL (ref 0.3–1.2)
Total Protein: 7.4 g/dL (ref 6.5–8.1)

## 2019-11-07 MED ORDER — TRAMADOL HCL 50 MG PO TABS: 50.0000 mg | ORAL_TABLET | Freq: Two times a day (BID) | ORAL | 0 refills | Status: DC | PRN

## 2019-11-07 NOTE — ED Triage Notes (Signed)
Patient has bruise to left inside of wrist and bruising to right inner thigh  Patient says she has not fallen.  Patient relates to medicine she has taken

## 2019-11-07 NOTE — ED Provider Notes (Addendum)
[facility]   MRN: [id number] DOB: Dec 14, 1950  Subjective:   Kayla Hunt is a 69 y.o. female presenting for acute onset of recurrent bruising over her forearms and right thigh.  Patient states that she is worried Lasix is causing this.  States that during the previous episode she had this happen when she started Lasix. Denies falls, trauma, swelling, chest pain, shortness of breath, worsening lower leg swelling.   No current facility-administered medications for this encounter.  Current Outpatient Medications:  .  allopurinol (ZYLOPRIM) 100 MG tablet, Take 1 tablet (100 mg total) by mouth daily., Disp: 90 tablet, Rfl: 1 .  Blood Glucose Monitoring Suppl (TRUE METRIX AIR GLUCOSE METER) DEVI, 1 Bag by Does not apply route 3 (three) times daily as needed., Disp: 1 each, Rfl: 0 .  furosemide (LASIX) 40 MG tablet, Take 1 tablet (40 mg total) by mouth daily., Disp: 30 tablet, Rfl: 1 .  glipiZIDE (GLUCOTROL) 5 MG tablet, Take 1 tablet (5 mg total) by mouth 2 (two) times daily before a meal., Disp: 60 tablet, Rfl: 3 .  glucose blood test strip, Check blood sugar once daily, Disp: 100 each, Rfl: 12 .  hydrALAZINE (APRESOLINE) 50 MG tablet, Take 1 tablet (50 mg total) by mouth 2 (two) times daily., Disp: 60 tablet, Rfl: 3 .  levothyroxine (SYNTHROID) 112 MCG tablet, Take 1 tablet (112 mcg total) by mouth daily., Disp: 90 tablet, Rfl: 0 .  loratadine (CLARITIN) 10 MG tablet, Take 10 mg by mouth daily as needed for allergies., Disp: , Rfl:  .  metoprolol tartrate (LOPRESSOR) 50 MG tablet, Take 1 tablet (50 mg total) by mouth 2 (two) times daily., Disp: 60 tablet, Rfl: 3 .  simvastatin (ZOCOR) 40 MG tablet, Take 0.5 tablets (20 mg total) by mouth daily., Disp: 30 tablet, Rfl: 3 .  TRUEplus Lancets 26G MISC, 1 Bottle by Does not apply route daily. Test blood sugar once daily, Disp: 100 each, Rfl: 11   No Known Allergies  Past Medical History:  Diagnosis Date  . Anemia   . Anxiety   .  Arthritis    "legs" (04/12/2018)  . CHF (congestive heart failure) ([place])   . Chronic back pain   . Chronic edema    bilat LE's  . CKD (chronic kidney disease), stage IV ([place])    [name] 04/12/2018  . Depression   . Gout   . Hyperlipidemia   . Hypertension   . Hypothyroidism   . Seizures ([place]) 05/05/2011   [name] 05/05/2011  . Type II diabetes mellitus ([place])      Past Surgical History:  Procedure Laterality Date  . NO PAST SURGERIES      Family History  Family history unknown: Yes    Social History   Tobacco Use  . Smoking status: Never Smoker  . Smokeless tobacco: Current User    Types: Snuff  Substance Use Topics  . Alcohol use: No  . Drug use: No    ROS   Objective:   Vitals: BP 139/84 (BP Location: Right Arm)   Pulse 100   Temp 99.9 F (37.7 C) (Oral)   Resp 18   SpO2 94%   Physical Exam Constitutional:      General: She is not in acute distress.    Appearance: Normal appearance. She is well-developed. She is not ill-appearing, toxic-appearing or diaphoretic.  HENT:     Head: Normocephalic and atraumatic.     Nose: Nose normal.     Mouth/Throat:  Mouth: Mucous membranes are moist.  Eyes:     Extraocular Movements: Extraocular movements intact.     Pupils: Pupils are equal, round, and reactive to light.  Cardiovascular:     Rate and Rhythm: Normal rate and regular rhythm.     Pulses: Normal pulses.     Heart sounds: Murmur present. No friction rub. No gallop.   Pulmonary:     Effort: Pulmonary effort is normal. No respiratory distress.     Breath sounds: Normal breath sounds. No stridor. No wheezing, rhonchi or rales.  Musculoskeletal:       Arms:     Right lower leg: Edema (chronic) present.     Left lower leg: Edema (chronic) present.  Skin:    General: Skin is warm and dry.     Findings: Bruising (over areas outlined) present. No rash.       Neurological:     Mental Status: She is alert and oriented to person, place, and time.    Psychiatric:        Mood and Affect: Mood normal.        Behavior: Behavior normal.        Thought Content: Thought content normal.        Judgment: Judgment normal.      Assessment and Plan :   1. Ecchymosis   2. Stage 3 chronic kidney disease, unspecified whether stage 3a or 3b CKD   3. Chronic edema   4. Congestive heart failure, unspecified HF chronicity, unspecified heart failure type ([place])   5. Arthritis    Patient is to maintain her Lasix. Stop any NSAID use. Labs pending. Suspect ecchymosis related to her CKD. Emphasized need to f/u with her PCP. Vital signs and PE findings stable for discharge. Counseled patient on potential for adverse effects with medications prescribed/recommended today, ER and return-to-clinic precautions discussed, patient verbalized understanding.    Jaynee Eagles, PA-C 11/07/19 1750   Correction made electronic AVS s/p printing AVS. Patient was actually advised to continue her Lasix in clinic and I emphasized this with her multiple times including having RN Hutchinson review maintaining Lasix with patient's grand-daughter.   Jaynee Eagles, [place] 11/12/19 [phone]

## 2019-11-07 NOTE — ED Notes (Signed)
Granddaughter Sharol Given met [place] in lobby and reviewed discharge instructions

## 2019-11-07 NOTE — Discharge Instructions (Addendum)
Please avoid using any kind of aspirin, ibuprofen, Advil, Motrin or any from the class of NSAID as these may be a source of your bruising. You may take 500mg -650mg  Tylenol every 6 hours for pain and inflammation.   Correction made electronic AVS s/p printing AVS. Patient was actually advised to continue her Lasix in clinic and I emphasized this with her multiple times including having RN Hutchinson review maintaining Lasix with patient's grand-daughter.

## 2019-11-20 ENCOUNTER — Ambulatory Visit (INDEPENDENT_AMBULATORY_CARE_PROVIDER_SITE_OTHER): Payer: Medicare Other | Admitting: Primary Care

## 2019-11-20 ENCOUNTER — Other Ambulatory Visit: Payer: Self-pay

## 2019-11-20 ENCOUNTER — Encounter (INDEPENDENT_AMBULATORY_CARE_PROVIDER_SITE_OTHER): Payer: Self-pay | Admitting: Primary Care

## 2019-11-20 VITALS — BP 177/78 | HR 83 | Temp 97.2°F | Ht 64.0 in

## 2019-11-20 DIAGNOSIS — E039 Hypothyroidism, unspecified: Secondary | ICD-10-CM | POA: Diagnosis not present

## 2019-11-20 DIAGNOSIS — I10 Essential (primary) hypertension: Secondary | ICD-10-CM

## 2019-11-20 DIAGNOSIS — T148XXA Other injury of unspecified body region, initial encounter: Secondary | ICD-10-CM

## 2019-11-20 DIAGNOSIS — E119 Type 2 diabetes mellitus without complications: Secondary | ICD-10-CM | POA: Diagnosis not present

## 2019-11-20 DIAGNOSIS — R413 Other amnesia: Secondary | ICD-10-CM | POA: Diagnosis not present

## 2019-11-20 DIAGNOSIS — N1831 Chronic kidney disease, stage 3a: Secondary | ICD-10-CM | POA: Diagnosis not present

## 2019-11-20 MED ORDER — GLIPIZIDE 5 MG PO TABS: 5.0000 mg | ORAL_TABLET | Freq: Two times a day (BID) | ORAL | 1 refills | Status: DC

## 2019-11-20 MED ORDER — LEVOTHYROXINE SODIUM 112 MCG PO TABS: 112.0000 ug | ORAL_TABLET | Freq: Every day | ORAL | 0 refills | Status: DC

## 2019-11-20 MED ORDER — ALLOPURINOL 100 MG PO TABS: 100.0000 mg | ORAL_TABLET | Freq: Every day | ORAL | 1 refills | Status: DC

## 2019-11-20 MED ORDER — LORATADINE 10 MG PO TABS: 10.0000 mg | ORAL_TABLET | Freq: Every day | ORAL | 1 refills | Status: DC | PRN

## 2019-11-20 MED ORDER — CHLORTHALIDONE 50 MG PO TABS: 50.0000 mg | ORAL_TABLET | Freq: Every day | ORAL | 1 refills | Status: DC

## 2019-11-20 MED ORDER — HYDRALAZINE HCL 50 MG PO TABS: 50.0000 mg | ORAL_TABLET | Freq: Two times a day (BID) | ORAL | 3 refills | Status: DC

## 2019-11-20 MED ORDER — SIMVASTATIN 40 MG PO TABS: 20.0000 mg | ORAL_TABLET | Freq: Every day | ORAL | 1 refills | Status: DC

## 2019-11-20 NOTE — Progress Notes (Signed)
Established Patient Office Visit  Subjective:  Patient ID: Kayla Hunt, female    DOB: 07/26/1951  Age: 69 y.o. MRN: [id number]  CC:  Chief Complaint  Patient presents with  . Follow-up    HTN    HPI Kayla Hunt presents for follow up on blood pressures which remains elevated. Previously nephrology and family was not aware of the scheduled appointment -she was in assistance living and was taken to the appointment. Spoke with grand daughter and patient refer back to nephrology to manage CKF and hypertension.   Past Medical History:  Diagnosis Date  . Anemia   . Anxiety   . Arthritis    "legs" (04/12/2018)  . CHF (congestive heart failure) ([place])   . Chronic back pain   . Chronic edema    bilat LE's  . CKD (chronic kidney disease), stage IV ([place])    [name] 04/12/2018  . Depression   . Gout   . Hyperlipidemia   . Hypertension   . Hypothyroidism   . Seizures ([place]) 05/05/2011   [name] 05/05/2011  . Type II diabetes mellitus ([place])     Past Surgical History:  Procedure Laterality Date  . NO PAST SURGERIES      Family History  Family history unknown: Yes    Social History   Socioeconomic History  . Marital status: Single    Spouse name: Not on file  . Number of children: Not on file  . Years of education: Not on file  . Highest education level: Not on file  Occupational History  . Not on file  Tobacco Use  . Smoking status: Never Smoker  . Smokeless tobacco: Current User    Types: Snuff  Substance and Sexual Activity  . Alcohol use: No  . Drug use: No  . Sexual activity: Not on file  Other Topics Concern  . Not on file  Social History Narrative  . Not on file   Social Determinants of Health   Financial Resource Strain:   . Difficulty of Paying Living Expenses: Not on file  Food Insecurity:   . Worried About Charity fundraiser in the Last Year: Not on file  . Ran Out of Food in the Last Year: Not on file  Transportation Needs:   . Lack of  Transportation (Medical): Not on file  . Lack of Transportation (Non-Medical): Not on file  Physical Activity:   . Days of Exercise per Week: Not on file  . Minutes of Exercise per Session: Not on file  Stress:   . Feeling of Stress : Not on file  Social Connections:   . Frequency of Communication with Friends and Family: Not on file  . Frequency of Social Gatherings with Friends and Family: Not on file  . Attends Religious Services: Not on file  . Active Member of Clubs or Organizations: Not on file  . Attends Archivist Meetings: Not on file  . Marital Status: Not on file  Intimate Partner Violence:   . Fear of Current or Ex-Partner: Not on file  . Emotionally Abused: Not on file  . Physically Abused: Not on file  . Sexually Abused: Not on file    Outpatient Medications Prior to Visit  Medication Sig Dispense Refill  . Blood Glucose Monitoring Suppl (TRUE METRIX AIR GLUCOSE METER) DEVI 1 Bag by Does not apply route 3 (three) times daily as needed. 1 each 0  . glucose blood test strip Check blood sugar once daily  100 each 12  . traMADol (ULTRAM) 50 MG tablet Take 1 tablet (50 mg total) by mouth every 12 (twelve) hours as needed. 10 tablet 0  . TRUEplus Lancets 26G MISC 1 Bottle by Does not apply route daily. Test blood sugar once daily 100 each 11  . allopurinol (ZYLOPRIM) 100 MG tablet Take 1 tablet (100 mg total) by mouth daily. 90 tablet 1  . glipiZIDE (GLUCOTROL) 5 MG tablet Take 1 tablet (5 mg total) by mouth 2 (two) times daily before a meal. 60 tablet 3  . hydrALAZINE (APRESOLINE) 50 MG tablet Take 1 tablet (50 mg total) by mouth 2 (two) times daily. 60 tablet 3  . levothyroxine (SYNTHROID) 112 MCG tablet Take 1 tablet (112 mcg total) by mouth daily. 90 tablet 0  . loratadine (CLARITIN) 10 MG tablet Take 10 mg by mouth daily as needed for allergies.    . metoprolol tartrate (LOPRESSOR) 50 MG tablet Take 1 tablet (50 mg total) by mouth 2 (two) times daily. 60 tablet 3   . furosemide (LASIX) 40 MG tablet Take 1 tablet (40 mg total) by mouth daily. (Patient not taking: Reported on 11/20/2019) 30 tablet 1  . simvastatin (ZOCOR) 20 MG tablet Take 20 mg by mouth at bedtime.    . simvastatin (ZOCOR) 40 MG tablet Take 0.5 tablets (20 mg total) by mouth daily. 30 tablet 3   No facility-administered medications prior to visit.    No Known Allergies  ROS Review of Systems  Neurological: Positive for weakness.  All other systems reviewed and are negative.     Objective:    Physical Exam  Constitutional: She is oriented to person, place, and time. She appears well-developed and well-nourished.  Eyes: Pupils are equal, round, and reactive to light.  Cardiovascular: Normal rate and regular rhythm.  Pulmonary/Chest: Breath sounds normal.  Abdominal: Bowel sounds are normal.  Musculoskeletal:        General: Edema present. Normal range of motion.     Cervical back: Normal range of motion and neck supple.  Neurological: She is oriented to person, place, and time. She has normal reflexes.  Skin: Skin is warm and dry.  Psychiatric: She has a normal mood and affect. Her behavior is normal. Thought content normal.    BP (!) 177/78 (BP Location: Left Arm, Patient Position: Sitting, Cuff Size: Large)   Pulse 83   Temp (!) 97.2 F (36.2 C) (Temporal)   Ht 5\' 4"  (1.626 m)   SpO2 94%   BMI 46.38 kg/m  Wt Readings from Last 3 Encounters:  07/15/19 270 lb 3.2 oz (122.6 kg)  04/23/18 268 lb 15.4 oz (122 kg)  03/11/15 (!) 314 lb (142.4 kg)     Health Maintenance Due  Topic Date Due  . Hepatitis C Screening  [birth year]  . FOOT EXAM  08/25/1963  . OPHTHALMOLOGY EXAM  08/25/1963  . TETANUS/TDAP  08/24/1972  . MAMMOGRAM  08/25/2003  . COLONOSCOPY  08/25/2003  . DEXA SCAN  08/24/2018  . PNA vac Low Risk Adult (1 of 2 - PCV13) 08/24/2018    There are no preventive care reminders to display for this patient.  Lab Results  Component Value Date   TSH 1.580  07/15/2019   Lab Results  Component Value Date   WBC 13.2 (H) 11/07/2019   HGB 9.6 (L) 11/07/2019   HCT 30.8 (L) 11/07/2019   MCV 94.8 11/07/2019   PLT 303 11/07/2019   Lab Results  Component Value Date  NA 139 11/07/2019   K 4.0 11/07/2019   CO2 29 11/07/2019   GLUCOSE 146 (H) 11/07/2019   BUN 30 (H) 11/07/2019   CREATININE 1.81 (H) 11/07/2019   BILITOT 1.2 11/07/2019   ALKPHOS 70 11/07/2019   AST 30 11/07/2019   ALT 16 11/07/2019   PROT 7.4 11/07/2019   ALBUMIN 2.6 (L) 11/07/2019   CALCIUM 8.8 (L) 11/07/2019   ANIONGAP 12 11/07/2019   Lab Results  Component Value Date   CHOL 370 (H) 07/15/2019   Lab Results  Component Value Date   HDL 61 07/15/2019   Lab Results  Component Value Date   LDLCALC 296 (H) 07/15/2019   Lab Results  Component Value Date   TRIG 86 07/15/2019   Lab Results  Component Value Date   CHOLHDL 6.1 (H) 07/15/2019   Lab Results  Component Value Date   HGBA1C 6.1 (A) 07/15/2019      Assessment & Plan:  Kayla Hunt was seen today for follow-up.  Diagnoses and all orders for this visit: Kayla Hunt was seen today for follow-up.  Diagnoses and all orders for this visit:  Stage 3a chronic kidney disease Labs indicating a decline in function BUN 32, Creatinine and GFR 34  GFR indicating evaluation from specialist- Nephrology   -     Ambulatory referral to Nephrology  Hypothyroidism, unspecified type Discussed what thyroid disease may affect  slow metabolism, fatigue, weight gain, dry hair/skin, constipation, swelling, decreased memory/brain fog. Please take it on an empty stomach 47mins-1 hour before food to have the most effective absorption. Currently on levothyroxine 112 Mcg.  Type 2 diabetes mellitus without complication, without long-term current use of insulin (HCC) A1C is well controlled previously 6.1 Currently on oral agents glucotrol 5mg  bid . The American Diabetes Association (ADA) has long recommended looser control for people who  are more frail. In these official guidelines, they recommend an A1C target of 7.5% for healthy people over 65, as compared to 7.0% for younger people. For people with other illnesses or impairments, their goal is 8.0% .  Memory loss Diagnosis of memory loss needing assistance with transfers, providing meals and ADL's. Medications need to be supervised or administration.  Essential hypertension, benign Uncontrolled secondary to underlying causative factor decline in kidney function.  Discontinued Lasix and change to Chlorthalidone 50mg  daily continue  Hydralazine 50mg  bid  Bruise Unknown etiology patient felt it is due to lasix bilateral edema 3+++ change to . Chlorthalidone . Not know of side effect.  Other orders -     chlorthalidone (HYGROTON) 50 MG tablet; Take 1 tablet (50 mg total) by mouth daily. -     loratadine (CLARITIN) 10 MG tablet; Take 1 tablet (10 mg total) by mouth daily as needed for allergies. -     allopurinol (ZYLOPRIM) 100 MG tablet; Take 1 tablet (100 mg total) by mouth daily. -     levothyroxine (SYNTHROID) 112 MCG tablet; Take 1 tablet (112 mcg total) by mouth daily. -     glipiZIDE (GLUCOTROL) 5 MG tablet; Take 1 tablet (5 mg total) by mouth 2 (two) times daily before a meal. -     hydrALAZINE (APRESOLINE) 50 MG tablet; Take 1 tablet (50 mg total) by mouth 2 (two) times daily. -     simvastatin (ZOCOR) 40 MG tablet; Take 0.5 tablets (20 mg total) by mouth daily.    Other orders -     chlorthalidone (HYGROTON) 50 MG tablet; Take 1 tablet (50 mg total) by mouth daily. -  loratadine (CLARITIN) 10 MG tablet; Take 1 tablet (10 mg total) by mouth daily as needed for allergies. -     allopurinol (ZYLOPRIM) 100 MG tablet; Take 1 tablet (100 mg total) by mouth daily. -     levothyroxine (SYNTHROID) 112 MCG tablet; Take 1 tablet (112 mcg total) by mouth daily. -     glipiZIDE (GLUCOTROL) 5 MG tablet; Take 1 tablet (5 mg total) by mouth 2 (two) times daily before a  meal. -     hydrALAZINE (APRESOLINE) 50 MG tablet; Take 1 tablet (50 mg total) by mouth 2 (two) times daily. -     simvastatin (ZOCOR) 40 MG tablet; Take 0.5 tablets (20 mg total) by mouth daily.   Meds ordered this encounter  Medications  . chlorthalidone (HYGROTON) 50 MG tablet    Sig: Take 1 tablet (50 mg total) by mouth daily.    Dispense:  90 tablet    Refill:  1  . loratadine (CLARITIN) 10 MG tablet    Sig: Take 1 tablet (10 mg total) by mouth daily as needed for allergies.    Dispense:  90 tablet    Refill:  1  . allopurinol (ZYLOPRIM) 100 MG tablet    Sig: Take 1 tablet (100 mg total) by mouth daily.    Dispense:  90 tablet    Refill:  1  . levothyroxine (SYNTHROID) 112 MCG tablet    Sig: Take 1 tablet (112 mcg total) by mouth daily.    Dispense:  90 tablet    Refill:  0  . glipiZIDE (GLUCOTROL) 5 MG tablet    Sig: Take 1 tablet (5 mg total) by mouth 2 (two) times daily before a meal.    Dispense:  180 tablet    Refill:  1  . hydrALAZINE (APRESOLINE) 50 MG tablet    Sig: Take 1 tablet (50 mg total) by mouth 2 (two) times daily.    Dispense:  180 tablet    Refill:  3  . simvastatin (ZOCOR) 40 MG tablet    Sig: Take 0.5 tablets (20 mg total) by mouth daily.    Dispense:  90 tablet    Refill:  1    Follow-up: Return in about 3 weeks (around 12/11/2019) for BP ck tele.    Kerin Perna, NP

## 2019-11-20 NOTE — Patient Instructions (Signed)
Chronic Kidney Disease, Adult Chronic kidney disease (CKD) happens when the kidneys are damaged over a long period of time. The kidneys are two organs that help with:  Getting rid of waste and extra fluid from the blood.  Making hormones that maintain the amount of fluid in your tissues and blood vessels.  Making sure that the body has the right amount of fluids and chemicals. Most of the time, CKD does not go away, but it can usually be controlled. Steps must be taken to slow down the kidney damage or to stop it from getting worse. If this is not done, the kidneys may stop working. Follow these instructions at home: Medicines  Take over-the-counter and prescription medicines only as told by your doctor. You may need to change the amount of medicines you take.  Do not take any new medicines unless your doctor says it is okay. Many medicines can make your kidney damage worse.  Do not take any vitamin and supplements unless your doctor says it is okay. Many vitamins and supplements can make your kidney damage worse. General instructions  Follow a diet as told by your doctor. You may need to stay away from: ? Alcohol. ? Salty foods. ? Foods that are high in:  Potassium.  Calcium.  Protein.  Do not use any products that contain nicotine or tobacco, such as cigarettes and e-cigarettes. If you need help quitting, ask your doctor.  Keep track of your blood pressure at home. Tell your doctor about any changes.  If you have diabetes, keep track of your blood sugar as told by your doctor.  Try to stay at a healthy weight. If you need help, ask your doctor.  Exercise at least 30 minutes a day, 5 days a week.  Stay up-to-date with your shots (immunizations) as told by your doctor.  Keep all follow-up visits as told by your doctor. This is important. Contact a doctor if:  Your symptoms get worse.  You have new symptoms. Get help right away if:  You have symptoms of end-stage  kidney disease. These may include: ? Headaches. ? Numbness in your hands or feet. ? Easy bruising. ? Having hiccups often. ? Chest pain. ? Shortness of breath. ? Stopping of menstrual periods in women.  You have a fever.  You have very little pee (urine).  You have pain or bleeding when you pee. Summary  Chronic kidney disease (CKD) happens when the kidneys are damaged over a long period of time.  Most of the time, this condition does not go away, but it can usually be controlled. Steps must be taken to slow down the kidney damage or to stop it from getting worse.  Treatment may include a combination of medicines and lifestyle changes. This information is not intended to replace advice given to you by your health care provider. Make sure you discuss any questions you have with your health care provider. Document Revised: 09/29/2017 Document Reviewed: 11/21/2016 Elsevier Patient Education  2020 Elsevier Inc.  

## 2019-12-06 ENCOUNTER — Other Ambulatory Visit (INDEPENDENT_AMBULATORY_CARE_PROVIDER_SITE_OTHER): Payer: Self-pay | Admitting: Primary Care

## 2019-12-16 ENCOUNTER — Ambulatory Visit (INDEPENDENT_AMBULATORY_CARE_PROVIDER_SITE_OTHER): Payer: Medicare Other | Admitting: Primary Care

## 2019-12-16 ENCOUNTER — Other Ambulatory Visit: Payer: Self-pay

## 2019-12-16 ENCOUNTER — Encounter (INDEPENDENT_AMBULATORY_CARE_PROVIDER_SITE_OTHER): Payer: Self-pay | Admitting: Primary Care

## 2019-12-16 DIAGNOSIS — N184 Chronic kidney disease, stage 4 (severe): Secondary | ICD-10-CM

## 2019-12-16 DIAGNOSIS — E1122 Type 2 diabetes mellitus with diabetic chronic kidney disease: Secondary | ICD-10-CM | POA: Diagnosis not present

## 2019-12-16 DIAGNOSIS — I10 Essential (primary) hypertension: Secondary | ICD-10-CM | POA: Diagnosis not present

## 2019-12-16 MED ORDER — BLOOD PRESSURE MONITORING KIT: 1.0000 | PACK | Status: DC

## 2019-12-16 MED ORDER — METOPROLOL TARTRATE 50 MG PO TABS: 50.0000 mg | ORAL_TABLET | Freq: Two times a day (BID) | ORAL | 1 refills | Status: DC

## 2019-12-16 NOTE — Progress Notes (Signed)
CBG before eating was 127  Unable to check Bp.. something worng with machine

## 2019-12-16 NOTE — Progress Notes (Signed)
Virtual Visit via Telephone Note  I connected with Kayla Hunt on 12/16/19 at 11:10 AM EST by telephone and verified that I am speaking with the correct person using two identifiers.   I discussed the limitations, risks, security and privacy concerns of performing an evaluation and management service by telephone and the availability of in person appointments. I also discussed with the patient that there may be a patient responsible charge related to this service. The patient expressed understanding and agreed to proceed.   History of Present Illness:  Kayla Hunt was initially having a tele visit for blood pressure follow up however her machine is not working properly. Will order her a new monitor . Patient states everything is fine she does not have any problems denies chest pain, shortness of breath not sure if edema is present. Past Medical History:  Diagnosis Date  . Anemia   . Anxiety   . Arthritis    "legs" (04/12/2018)  . CHF (congestive heart failure) ([place])   . Chronic back pain   . Chronic edema    bilat LE's  . CKD (chronic kidney disease), stage IV ([place])    [name] 04/12/2018  . Depression   . Gout   . Hyperlipidemia   . Hypertension   . Hypothyroidism   . Seizures ([place]) 05/05/2011   [name] 05/05/2011  . Type II diabetes mellitus ([place])    Current Outpatient Medications on File Prior to Visit  Medication Sig Dispense Refill  . allopurinol (ZYLOPRIM) 100 MG tablet Take 1 tablet (100 mg total) by mouth daily. 90 tablet 1  . Blood Glucose Monitoring Suppl (TRUE METRIX AIR GLUCOSE METER) DEVI 1 Bag by Does not apply route 3 (three) times daily as needed. 1 each 0  . chlorthalidone (HYGROTON) 50 MG tablet Take 1 tablet (50 mg total) by mouth daily. 90 tablet 1  . furosemide (LASIX) 40 MG tablet TAKE 1 TABLET BY MOUTH EVERY DAY 30 tablet 1  . glipiZIDE (GLUCOTROL) 5 MG tablet Take 1 tablet (5 mg total) by mouth 2 (two) times daily before a meal. 180 tablet 1  . glucose  blood test strip Check blood sugar once daily 100 each 12  . hydrALAZINE (APRESOLINE) 50 MG tablet Take 1 tablet (50 mg total) by mouth 2 (two) times daily. 180 tablet 3  . levothyroxine (SYNTHROID) 112 MCG tablet Take 1 tablet (112 mcg total) by mouth daily. 90 tablet 0  . loratadine (CLARITIN) 10 MG tablet Take 1 tablet (10 mg total) by mouth daily as needed for allergies. 90 tablet 1  . simvastatin (ZOCOR) 40 MG tablet Take 0.5 tablets (20 mg total) by mouth daily. 90 tablet 1  . traMADol (ULTRAM) 50 MG tablet Take 1 tablet (50 mg total) by mouth every 12 (twelve) hours as needed. 10 tablet 0  . TRUEplus Lancets 26G MISC 1 Bottle by Does not apply route daily. Test blood sugar once daily 100 each 11  . metoprolol tartrate (LOPRESSOR) 50 MG tablet Take 1 tablet (50 mg total) by mouth 2 (two) times daily. 60 tablet 3   No current facility-administered medications on file prior to visit.    Observations/Objective: Review of Systems  All other systems reviewed and are negative.   Assessment and Plan: [name] was seen today for blood pressure check.  Diagnoses and all orders for this visit:  Essential hypertension, benign Previous in person visit was 177/78 added Chlorothion 48m unclear if improvement in blood pressure . prescribed Bp monitor  Type 2 diabetes mellitus with stage 4 chronic kidney disease, without long-term current use of insulin (HCC) CGB this AM fasting 127. Continue to take glucotrol 80m twice daily. A1C 5 months ago well controlled 6.1 . Continue to monitor carbs, breads, sweets, sugars and rice. Exercising is difficult wheel chair bond but can participate in isometric exercises with her grand daughter   Other orders -     Blood Pressure Monitoring KIT; 1 kit by Does not apply route 3 (three) times a week.    Follow Up Instructions:    I discussed the assessment and treatment plan with the patient. The patient was provided an opportunity to ask questions and all  were answered. The patient agreed with the plan and demonstrated an understanding of the instructions.   The patient was advised to call back or seek an in-person evaluation if the symptoms worsen or if the condition fails to improve as anticipated.  I provided 9 minutes of non-face-to-face time during this encounter.   MKerin Perna NP

## 2020-01-13 ENCOUNTER — Telehealth (INDEPENDENT_AMBULATORY_CARE_PROVIDER_SITE_OTHER): Payer: Self-pay

## 2020-01-13 NOTE — Telephone Encounter (Signed)
Patient grand daughter called wanting to know the status of the home health referral for her grandmother to get a nurse aide to help her bath and helping her at home. Patient grand daughter states its been a couple of months and she has not received an update. Patient grand daughter also stated that medicaid sent a letter to her grandmother stating that it has been 29 days and they have not received any information and plan to close the case.  Please advice Lina Sayre ( grand daughter) [phone]  Thank you Whitney Post

## 2020-01-14 NOTE — Telephone Encounter (Signed)
Spoke with Jane,RN.. she stated referral was faxed to [facility]. Advised to have patient granddaughter to contact them. Spoke with patients granddaughter tykea. She was not aware of anything being sent to [facility]. She states form should have been faxed to [employer]. States a lady by the name of Maudie Mercury would be coming to clinic today to drop off form again, to get patient set up for services through [facility]. Tykea asked if I could contact [facility] and check on status. I told her that I would call them and give her an update by Friday. She stated that would be fine.

## 2020-01-16 NOTE — Telephone Encounter (Signed)
Spoke with Quarry manager at [employer]. They attempted to reach patient or family member several times back in December with no success. New DMA would need to be completed and faxed. Called patients granddaughter to inform but she did not answer. Left message asking her to return call to [facility] at [phone].

## 2020-01-16 NOTE — Telephone Encounter (Signed)
Patients granddaughter returned call. She is aware that new form has to be sent to [facility]. She is confused and has some questions. She was told by a case worker for [employer] that services would go through them. Now being told it will go to [facility]. Provided phone number to granddaughter so she may contact [facility].

## 2020-03-24 ENCOUNTER — Other Ambulatory Visit (INDEPENDENT_AMBULATORY_CARE_PROVIDER_SITE_OTHER): Payer: Self-pay | Admitting: Primary Care

## 2020-03-27 ENCOUNTER — Telehealth: Payer: Self-pay

## 2020-03-27 NOTE — Telephone Encounter (Signed)
Called patient to do their pre-visit COVID screening.  Call went to voicemail. Unable to do prescreening.  

## 2020-03-28 ENCOUNTER — Other Ambulatory Visit (INDEPENDENT_AMBULATORY_CARE_PROVIDER_SITE_OTHER): Payer: Self-pay | Admitting: Primary Care

## 2020-03-31 ENCOUNTER — Ambulatory Visit: Payer: Medicare Other | Admitting: Internal Medicine

## 2020-03-31 NOTE — Telephone Encounter (Signed)
Sent to PCP ?

## 2020-04-02 ENCOUNTER — Telehealth (INDEPENDENT_AMBULATORY_CARE_PROVIDER_SITE_OTHER): Payer: Self-pay | Admitting: Primary Care

## 2020-04-02 MED ORDER — CHLORTHALIDONE 50 MG PO TABS: ORAL_TABLET | ORAL | 1 refills | Status: DC

## 2020-04-02 NOTE — Telephone Encounter (Signed)
I called the pharmacy and the denied receiving refill request and stated it needs to be sent again. I confirmed on our end it was received by [facility] at 4:46 yesterday 1) Medication(s) Requested (by name): chlorthalidone (HYGROTON) 50 MG tablet   2) Pharmacy of Choice: [facility] [address] [facility] [address] [place] 3) Special Requests:   Approved medications will be sent to the pharmacy, we will reach out if there is an issue.  Requests made after 3pm may not be addressed until the following business day!  If a patient is unsure of the name of the medication(s) please note and ask patient to call back when they are able to provide all info, do not send to responsible party until all information is available!

## 2020-04-02 NOTE — Telephone Encounter (Signed)
Medication has been resent to the pharmacy.

## 2020-06-25 ENCOUNTER — Other Ambulatory Visit (INDEPENDENT_AMBULATORY_CARE_PROVIDER_SITE_OTHER): Payer: Self-pay | Admitting: Primary Care

## 2020-06-29 ENCOUNTER — Telehealth: Payer: Self-pay | Admitting: Primary Care

## 2020-06-29 NOTE — Telephone Encounter (Signed)
Pt granddaughter called stating that she no longer wanted her grandmother seen at [facility] due to the [place] being very rude, unprofessional and she also talked down to her like a child. She would like to transfer care and speak with management.

## 2020-09-26 ENCOUNTER — Other Ambulatory Visit (INDEPENDENT_AMBULATORY_CARE_PROVIDER_SITE_OTHER): Payer: Self-pay | Admitting: Primary Care

## 2020-09-26 NOTE — Telephone Encounter (Signed)
Requested Prescriptions  Pending Prescriptions Disp Refills  . metoprolol tartrate (LOPRESSOR) 50 MG tablet [Pharmacy Med Name: METOPROLOL TARTRATE 50MG  TABLETS] 60 tablet 0    Sig: TAKE 1 TABLET(50 MG) BY MOUTH TWICE DAILY     Cardiovascular:  Beta Blockers Failed - 09/26/2020  9:35 AM      Failed - Last BP in normal range    BP Readings from Last 1 Encounters:  11/20/19 (!) 177/78         Failed - Valid encounter within last 6 months    Recent Outpatient Visits          9 months ago Essential hypertension, benign   [place] Kerin Perna, NP   10 months ago Stage 3a chronic kidney disease   [facility] Kerin Perna, NP   1 year ago Memory loss   [place] Kerin Perna, NP   1 year ago Screening for diabetes mellitus   [place] Kerin Perna, NP             Passed - Last Heart Rate in normal range    Pulse Readings from Last 1 Encounters:  11/20/19 83

## 2021-01-10 ENCOUNTER — Other Ambulatory Visit (INDEPENDENT_AMBULATORY_CARE_PROVIDER_SITE_OTHER): Payer: Self-pay | Admitting: Primary Care

## 2021-01-10 NOTE — Telephone Encounter (Signed)
Requested medication (s) are due for refill today: yes  Requested medication (s) are on the active medication list: yes  Last refill:  allopurinol: 09/26/20    chlorthalidone: 04/02/20  Future visit scheduled: no  Notes to clinic:  called pt and LM on VM to call office to schedule appt- phone number provided   Requested Prescriptions  Pending Prescriptions Disp Refills   allopurinol (ZYLOPRIM) 100 MG tablet [Pharmacy Med Name: ALLOPURINOL '100MG'$  TABLETS] 90 tablet 0    Sig: TAKE 1 TABLET(100 MG) BY MOUTH DAILY      Endocrinology:  Gout Agents Failed - 01/10/2021  4:05 AM      Failed - Uric Acid in normal range and within 360 days    Uric Acid  Date Value Ref Range Status  07/15/2019 5.9 2.5 - 7.1 mg/dL Final    Comment:               Therapeutic target for gout patients: <6.0          Failed - Cr in normal range and within 360 days    Creatinine, Ser  Date Value Ref Range Status  11/07/2019 1.81 (H) 0.44 - 1.00 mg/dL Final          Failed - Valid encounter within last 12 months    Recent Outpatient Visits           1 year ago Essential hypertension, benign   [place], Michelle P, NP   1 year ago Stage 3a chronic kidney disease   [facility] Kerin Perna, NP   1 year ago Memory loss   [place], Michelle P, NP   1 year ago Screening for diabetes mellitus   [facility] Mire P, NP                  chlorthalidone (HYGROTON) 50 MG tablet [Pharmacy Med Name: CHLORTHALIDONE '50MG'$  TABLETS] 90 tablet 1    Sig: TAKE 1 TABLET(50 MG) BY MOUTH DAILY      Cardiovascular: Diuretics - Thiazide Failed - 01/10/2021  4:05 AM      Failed - Ca in normal range and within 360 days    Calcium  Date Value Ref Range Status  11/07/2019 8.8 (L) 8.9 - 10.3 mg/dL Final          Failed - Cr in normal range and within 360 days    Creatinine, Ser  Date  Value Ref Range Status  11/07/2019 1.81 (H) 0.44 - 1.00 mg/dL Final          Failed - K in normal range and within 360 days    Potassium  Date Value Ref Range Status  11/07/2019 4.0 3.5 - 5.1 mmol/L Final          Failed - Na in normal range and within 360 days    Sodium  Date Value Ref Range Status  11/07/2019 139 135 - 145 mmol/L Final  07/15/2019 142 134 - 144 mmol/L Final          Failed - Last BP in normal range    BP Readings from Last 1 Encounters:  11/20/19 (!) 177/78          Failed - Valid encounter within last 6 months    Recent Outpatient Visits           1 year ago Essential hypertension, benign   [facility] Kerin Perna, NP   1 year ago Stage 3a chronic kidney disease   [facility] Kerin Perna, NP   1 year ago Memory loss   [facility] Kerin Perna, NP   1 year ago Screening for diabetes mellitus   [facility] Kerin Perna, NP

## 2021-01-21 ENCOUNTER — Telehealth (INDEPENDENT_AMBULATORY_CARE_PROVIDER_SITE_OTHER): Payer: Medicare Other | Admitting: Primary Care

## 2021-01-27 ENCOUNTER — Ambulatory Visit (INDEPENDENT_AMBULATORY_CARE_PROVIDER_SITE_OTHER): Payer: Medicare Other | Admitting: Primary Care

## 2021-01-27 ENCOUNTER — Encounter (INDEPENDENT_AMBULATORY_CARE_PROVIDER_SITE_OTHER): Payer: Self-pay | Admitting: Primary Care

## 2021-01-27 ENCOUNTER — Other Ambulatory Visit: Payer: Self-pay

## 2021-01-27 VITALS — BP 158/86 | HR 64 | Temp 97.2°F

## 2021-01-27 DIAGNOSIS — I10 Essential (primary) hypertension: Secondary | ICD-10-CM | POA: Diagnosis not present

## 2021-01-27 DIAGNOSIS — E1122 Type 2 diabetes mellitus with diabetic chronic kidney disease: Secondary | ICD-10-CM

## 2021-01-27 DIAGNOSIS — E785 Hyperlipidemia, unspecified: Secondary | ICD-10-CM | POA: Diagnosis not present

## 2021-01-27 DIAGNOSIS — E039 Hypothyroidism, unspecified: Secondary | ICD-10-CM

## 2021-01-27 DIAGNOSIS — R600 Localized edema: Secondary | ICD-10-CM

## 2021-01-27 DIAGNOSIS — N184 Chronic kidney disease, stage 4 (severe): Secondary | ICD-10-CM | POA: Diagnosis not present

## 2021-01-27 LAB — POCT GLYCOSYLATED HEMOGLOBIN (HGB A1C): Hemoglobin A1C: 7.9 % — AB (ref 4.0–5.6)

## 2021-01-27 MED ORDER — SITAGLIP PHOS-METFORMIN HCL ER 50-1000 MG PO TB24: 50.0000 mg | ORAL_TABLET | Freq: Every day | ORAL | 1 refills | Status: DC

## 2021-01-27 MED ORDER — GLIPIZIDE 5 MG PO TABS: 5.0000 mg | ORAL_TABLET | Freq: Two times a day (BID) | ORAL | 1 refills | Status: DC

## 2021-01-27 MED ORDER — CHLORTHALIDONE 50 MG PO TABS: ORAL_TABLET | ORAL | 1 refills | Status: DC

## 2021-01-27 MED ORDER — HYDRALAZINE HCL 50 MG PO TABS: 50.0000 mg | ORAL_TABLET | Freq: Two times a day (BID) | ORAL | 3 refills | Status: DC

## 2021-01-27 MED ORDER — LISINOPRIL 20 MG PO TABS: 20.0000 mg | ORAL_TABLET | Freq: Every day | ORAL | 3 refills | Status: DC

## 2021-01-27 NOTE — Progress Notes (Signed)
Kayla Hunt is a 70 y.o. female who presents for an evaluation of Type 2 diabetes mellitus.  Current symptoms/problems include polyuria and visual disturbances and have been worsening. Symptoms have been present for 1 months.  Current diabetic medications include oral agents (dual therapy): combination:   The patient was initially diagnosed with Type 2 diabetes mellitus based on the following criteria:  ADA Guidelines  Current monitoring regimen: none and home blood tests - daily Home blood sugar records: fasting range: 100-110 Any episodes of hypoglycemia? no  Known diabetic complications: cardiovascular disease and cerebrovascular disease Cardiovascular risk factors: advanced age (older than 78 for men, 37 for women), diabetes mellitus, dyslipidemia, hypertension and obesity (BMI >= 30 kg/m2) Eye exam current (within one year): no Weight trend: stable Prior visit with CDE: yes -  Current diet: low fat/ cholesterol, low salt Current exercise: none Medication Compliance?  Yes   Is She on ACE inhibitor or angiotensin II receptor blocker?  Yes  lisinopril (Prinivil)    Review of Systems  Cardiovascular: Positive for leg swelling.  Genitourinary: Positive for frequency and urgency.  All other systems reviewed and are negative.   Objective:    BP (!) 158/86 (BP Location: Left Arm, Patient Position: Sitting, Cuff Size: Large)   Pulse 64   Temp (!) 97.2 F (36.2 C) (Temporal)   SpO2 94%   Physical Exam Vitals reviewed.  Constitutional:      Appearance: She is obese.     Comments: morbid  HENT:     Right Ear: Tympanic membrane and external ear normal.     Left Ear: Tympanic membrane and external ear normal.     Ears:     Comments: HOH     Nose: Nose normal.  Eyes:     Extraocular Movements: Extraocular movements intact.  Cardiovascular:     Rate and Rhythm: Normal rate and regular rhythm.  Pulmonary:     Effort: Pulmonary effort is normal.     Breath  sounds: Normal breath sounds.  Abdominal:     General: Bowel sounds are normal. There is distension.     Palpations: Abdomen is soft.  Musculoskeletal:        General: Swelling present.     Right lower leg: Edema present.     Left lower leg: Edema present.     Comments: Mobility limited uses wheel chair   Skin:    General: Skin is warm and dry.  Neurological:     Mental Status: She is alert and oriented to person, place, and time.  Psychiatric:        Mood and Affect: Mood normal.        Behavior: Behavior normal.        Thought Content: Thought content normal.        Judgment: Judgment normal.      Lab Review Glucose (mg/dL)  Date Value  07/15/2019 83   Glucose, Bld (mg/dL)  Date Value  11/07/2019 146 (H)  05/20/2019 167 (H)  04/23/2018 144 (H)   CO2 (mmol/L)  Date Value  11/07/2019 29  07/15/2019 25  05/20/2019 28   BUN (mg/dL)  Date Value  11/07/2019 30 (H)  07/15/2019 32 (H)  05/20/2019 31 (H)  04/23/2018 38 (H)   Creatinine, Ser (mg/dL)  Date Value  11/07/2019 1.81 (H)  07/15/2019 1.78 (H)  05/20/2019 1.72 (H)      Assessment:    Diabetes Mellitus type II, under good control.   Kayla Hunt  was seen today for hypertension and medication refill.  Diagnoses and all orders for this visit:  Type 2 diabetes mellitus with stage 4 chronic kidney disease, without long-term current use of insulin (HCC) -     HgB A1c 7.9  -     CBC with Differential/Platelet; Future  Essential hypertension, benign -     CMP14+EGFR; Future  Hypothyroidism, unspecified type -     TSH + free T4; Future  Dyslipidemia -     Lipid panel; Future  Other orders -     hydrALAZINE (APRESOLINE) 50 MG tablet; Take 1 tablet (50 mg total) by mouth 2 (two) times daily. -     glipiZIDE (GLUCOTROL) 5 MG tablet; Take 1 tablet (5 mg total) by mouth 2 (two) times daily before a meal.      Plan:    1.  Rx changes: none 2.  Education: Reviewed 'ABCs' of diabetes management (respective  goals in parentheses):  A1C (<7), blood pressure (<130/80), and cholesterol (LDL <100). 3. Discussed pathophysiology of DM; difference between type 1 and type 2 DM. 4. CHO counting diet discussed.  Reviewed CHO amount in various foods and how to read nutrition labels.  Discussed recommended serving sizes.  5.  Recommend check BG 0  times a day 6.  Recommended increase physical activity - goal is 150 minutes per week 7. Follow up: 3 months

## 2021-02-03 ENCOUNTER — Telehealth (INDEPENDENT_AMBULATORY_CARE_PROVIDER_SITE_OTHER): Payer: Self-pay

## 2021-02-03 NOTE — Telephone Encounter (Signed)
Copied from [facility] [id number]. Topic: General - Other >> Feb 02, 2021  3:31 PM Keene Breath wrote: Reason for CRM: Patient's daughter would like the nurse or doctor to call regarding the new medication that the doctor prescribed.  Daughter stated that patient had an episode today where her blood sugar dropped and patient is not feeling well.  Please advise and call to discuss at [phone]

## 2021-02-04 ENCOUNTER — Telehealth (INDEPENDENT_AMBULATORY_CARE_PROVIDER_SITE_OTHER): Payer: Self-pay

## 2021-02-04 NOTE — Telephone Encounter (Signed)
Copied from [facility] [phone]. Topic: General - Other >> Feb 02, 2021  3:31 PM Keene Breath wrote: Reason for CRM: Patient's daughter would like the nurse or doctor to call regarding the new medication that the doctor prescribed.  Daughter stated that patient had an episode today where her blood sugar dropped and patient is not feeling well.  Please advise and call to discuss at [phone] >> Feb 04, 2021  9:38 AM Yvette Rack wrote: Pt daughter Catha Nottingham would like the nurse or provider to call her back regarding the new medication that was prescribed.  Pt daughter stated pt blood sugar dropped and she will not be giving her the medication.  Pt daughter requests call back at [phone]

## 2021-02-05 NOTE — Telephone Encounter (Signed)
Spoke to daughter BS dropped EMS called ate 2 peanut butter sandwich and oj . No medication since does not want Metformin 500 XR - re-evaluate BS  with readings to call in next week

## 2021-02-24 ENCOUNTER — Other Ambulatory Visit: Payer: Self-pay | Admitting: Internal Medicine

## 2021-02-27 LAB — LIPID PANEL
Cholesterol: 392 mg/dL — ABNORMAL HIGH (ref <200)
HDL: 41 mg/dL — ABNORMAL LOW (ref >=50)
LDL Cholesterol (Calc): 322 mg/dL (calc) — ABNORMAL HIGH
Non-HDL Cholesterol (Calc): 351 mg/dL (calc) — ABNORMAL HIGH (ref <130)
Total CHOL/HDL Ratio: 9.6 (calc) — ABNORMAL HIGH (ref <5.0)
Triglycerides: 135 mg/dL (ref <150)

## 2021-02-27 LAB — COMPLETE METABOLIC PANEL WITH GFR
AG Ratio: 1.1 (calc) (ref 1.0–2.5)
ALT: 12 U/L (ref 6–29)
AST: 16 U/L (ref 10–35)
Albumin: 3.7 g/dL (ref 3.6–5.1)
Alkaline phosphatase (APISO): 61 U/L (ref 37–153)
BUN/Creatinine Ratio: 33 (calc) — ABNORMAL HIGH (ref 6–22)
BUN: 62 mg/dL — ABNORMAL HIGH (ref 7–25)
CO2: 23 mmol/L (ref 20–32)
Calcium: 9.4 mg/dL (ref 8.6–10.4)
Chloride: 103 mmol/L (ref 98–110)
Creat: 1.87 mg/dL — ABNORMAL HIGH (ref 0.50–0.99)
GFR, Est African American: 32 mL/min/{1.73_m2} — ABNORMAL LOW (ref >=60)
GFR, Est Non African American: 27 mL/min/{1.73_m2} — ABNORMAL LOW (ref >=60)
Globulin: 3.5 g/dL (calc) (ref 1.9–3.7)
Glucose, Bld: 53 mg/dL — ABNORMAL LOW (ref 65–99)
Potassium: 4.7 mmol/L (ref 3.5–5.3)
Sodium: 138 mmol/L (ref 135–146)
Total Bilirubin: 0.4 mg/dL (ref 0.2–1.2)
Total Protein: 7.2 g/dL (ref 6.1–8.1)

## 2021-02-27 LAB — CBC
HCT: 36.4 % (ref 35.0–45.0)
Hemoglobin: 11.7 g/dL (ref 11.7–15.5)
MCH: 30.2 pg (ref 27.0–33.0)
MCHC: 32.1 g/dL (ref 32.0–36.0)
MCV: 93.8 fL (ref 80.0–100.0)
MPV: 12.3 fL (ref 7.5–12.5)
Platelets: 258 10*3/uL (ref 140–400)
RBC: 3.88 10*6/uL (ref 3.80–5.10)
RDW: 13.1 % (ref 11.0–15.0)
WBC: 5.3 10*3/uL (ref 3.8–10.8)

## 2021-02-27 LAB — TSH: TSH: 45.58 mIU/L — ABNORMAL HIGH (ref 0.40–4.50)

## 2021-02-27 LAB — VITAMIN D 25 HYDROXY (VIT D DEFICIENCY, FRACTURES): Vit D, 25-Hydroxy: 16 ng/mL — ABNORMAL LOW (ref 30–100)

## 2021-02-27 LAB — T4, FREE: Free T4: 0.6 ng/dL — ABNORMAL LOW (ref 0.8–1.8)

## 2021-02-27 LAB — T3: T3, Total: 64 ng/dL — ABNORMAL LOW (ref 76–181)

## 2021-02-27 LAB — T3 UPTAKE: T3 Uptake: 26 % (ref 22–35)

## 2021-03-05 ENCOUNTER — Other Ambulatory Visit: Payer: Self-pay | Admitting: Internal Medicine

## 2021-03-05 DIAGNOSIS — Z1231 Encounter for screening mammogram for malignant neoplasm of breast: Secondary | ICD-10-CM

## 2021-03-30 ENCOUNTER — Ambulatory Visit: Payer: Medicare Other | Admitting: Podiatry

## 2021-04-16 ENCOUNTER — Other Ambulatory Visit: Payer: Self-pay

## 2021-04-19 ENCOUNTER — Ambulatory Visit: Payer: Medicare Other | Admitting: Podiatry

## 2021-04-29 ENCOUNTER — Ambulatory Visit (INDEPENDENT_AMBULATORY_CARE_PROVIDER_SITE_OTHER): Payer: Medicare Other | Admitting: Primary Care

## 2021-04-29 ENCOUNTER — Inpatient Hospital Stay: Admission: RE | Admit: 2021-04-29 | Payer: Medicare Other | Source: Ambulatory Visit

## 2021-06-28 ENCOUNTER — Other Ambulatory Visit (INDEPENDENT_AMBULATORY_CARE_PROVIDER_SITE_OTHER): Payer: Self-pay | Admitting: Primary Care

## 2021-09-22 ENCOUNTER — Other Ambulatory Visit (INDEPENDENT_AMBULATORY_CARE_PROVIDER_SITE_OTHER): Payer: Self-pay | Admitting: Primary Care

## 2021-09-22 DIAGNOSIS — R600 Localized edema: Secondary | ICD-10-CM

## 2021-09-22 NOTE — Telephone Encounter (Signed)
Sent to PCP ?

## 2021-09-28 ENCOUNTER — Other Ambulatory Visit (INDEPENDENT_AMBULATORY_CARE_PROVIDER_SITE_OTHER): Payer: Self-pay | Admitting: Primary Care

## 2021-09-28 DIAGNOSIS — N184 Chronic kidney disease, stage 4 (severe): Secondary | ICD-10-CM

## 2021-09-28 DIAGNOSIS — E1122 Type 2 diabetes mellitus with diabetic chronic kidney disease: Secondary | ICD-10-CM

## 2021-09-28 NOTE — Telephone Encounter (Signed)
Sent to PCP to refill if appropriate.  

## 2021-10-04 ENCOUNTER — Other Ambulatory Visit (INDEPENDENT_AMBULATORY_CARE_PROVIDER_SITE_OTHER): Payer: Self-pay | Admitting: Primary Care

## 2021-12-29 ENCOUNTER — Other Ambulatory Visit (INDEPENDENT_AMBULATORY_CARE_PROVIDER_SITE_OTHER): Payer: Self-pay | Admitting: Primary Care

## 2022-03-21 ENCOUNTER — Other Ambulatory Visit (INDEPENDENT_AMBULATORY_CARE_PROVIDER_SITE_OTHER): Payer: Self-pay | Admitting: Primary Care

## 2023-05-11 ENCOUNTER — Other Ambulatory Visit: Payer: Self-pay | Admitting: Internal Medicine

## 2023-05-12 LAB — RENAL PROFILE WITH ESTIMATED GFR
Albumin: 3.4 g/dL — ABNORMAL LOW (ref 3.6–5.1)
BUN/Creatinine Ratio: 19 (calc) (ref 6–22)
BUN: 35 mg/dL — ABNORMAL HIGH (ref 7–25)
CO2: 26 mmol/L (ref 20–32)
Calcium: 9.3 mg/dL (ref 8.6–10.4)
Chloride: 102 mmol/L (ref 98–110)
Creat: 1.83 mg/dL — ABNORMAL HIGH (ref 0.50–1.05)
Glucose, Bld: 141 mg/dL — ABNORMAL HIGH (ref 65–99)
Phosphorus: 3.2 mg/dL (ref 2.1–4.3)
Potassium: 4.1 mmol/L (ref 3.5–5.3)
Sodium: 139 mmol/L (ref 135–146)
eGFR: 30 mL/min/{1.73_m2} — ABNORMAL LOW (ref >=60)

## 2023-05-12 LAB — EXTRA LAV TOP TUBE

## 2023-05-12 LAB — URIC ACID: Uric Acid, Serum: 8.1 mg/dL — ABNORMAL HIGH (ref 2.5–7.0)

## 2023-05-12 LAB — T4, FREE: Free T4: 1.5 ng/dL (ref 0.8–1.8)

## 2023-05-12 LAB — TSH: TSH: 0.86 mIU/L (ref 0.40–4.50)

## 2024-03-13 ENCOUNTER — Inpatient Hospital Stay (HOSPITAL_COMMUNITY)
Admission: EM | Admit: 2024-03-13 | Discharge: 2024-03-20 | DRG: 071 | Disposition: A | Discharge status: Home Health | Attending: Internal Medicine | Admitting: Internal Medicine

## 2024-03-13 ENCOUNTER — Observation Stay (HOSPITAL_COMMUNITY)

## 2024-03-13 ENCOUNTER — Emergency Department (HOSPITAL_COMMUNITY)

## 2024-03-13 ENCOUNTER — Other Ambulatory Visit: Payer: Self-pay

## 2024-03-13 DIAGNOSIS — Z720 Tobacco use: Secondary | ICD-10-CM

## 2024-03-13 DIAGNOSIS — I358 Other nonrheumatic aortic valve disorders: Secondary | ICD-10-CM

## 2024-03-13 DIAGNOSIS — I160 Hypertensive urgency: Secondary | ICD-10-CM

## 2024-03-13 DIAGNOSIS — R2990 Unspecified symptoms and signs involving the nervous system: Secondary | ICD-10-CM | POA: Diagnosis not present

## 2024-03-13 DIAGNOSIS — Z79890 Hormone replacement therapy (postmenopausal): Secondary | ICD-10-CM

## 2024-03-13 DIAGNOSIS — I10 Essential (primary) hypertension: Secondary | ICD-10-CM | POA: Diagnosis not present

## 2024-03-13 DIAGNOSIS — N184 Chronic kidney disease, stage 4 (severe): Secondary | ICD-10-CM | POA: Diagnosis present

## 2024-03-13 DIAGNOSIS — Z66 Do not resuscitate: Secondary | ICD-10-CM | POA: Diagnosis present

## 2024-03-13 DIAGNOSIS — Z7984 Long term (current) use of oral hypoglycemic drugs: Secondary | ICD-10-CM

## 2024-03-13 DIAGNOSIS — E785 Hyperlipidemia, unspecified: Secondary | ICD-10-CM | POA: Diagnosis present

## 2024-03-13 DIAGNOSIS — G9341 Metabolic encephalopathy: Secondary | ICD-10-CM | POA: Diagnosis present

## 2024-03-13 DIAGNOSIS — Z6841 Body Mass Index (BMI) 40.0 and over, adult: Secondary | ICD-10-CM

## 2024-03-13 DIAGNOSIS — I6783 Posterior reversible encephalopathy syndrome: Secondary | ICD-10-CM | POA: Diagnosis not present

## 2024-03-13 DIAGNOSIS — E876 Hypokalemia: Secondary | ICD-10-CM | POA: Diagnosis present

## 2024-03-13 DIAGNOSIS — I253 Aneurysm of heart: Secondary | ICD-10-CM | POA: Diagnosis present

## 2024-03-13 DIAGNOSIS — Z79899 Other long term (current) drug therapy: Secondary | ICD-10-CM

## 2024-03-13 DIAGNOSIS — E039 Hypothyroidism, unspecified: Secondary | ICD-10-CM | POA: Diagnosis present

## 2024-03-13 DIAGNOSIS — G459 Transient cerebral ischemic attack, unspecified: Secondary | ICD-10-CM | POA: Diagnosis present

## 2024-03-13 DIAGNOSIS — R4182 Altered mental status, unspecified: Principal | ICD-10-CM

## 2024-03-13 DIAGNOSIS — E1122 Type 2 diabetes mellitus with diabetic chronic kidney disease: Secondary | ICD-10-CM | POA: Diagnosis present

## 2024-03-13 DIAGNOSIS — Z7982 Long term (current) use of aspirin: Secondary | ICD-10-CM

## 2024-03-13 DIAGNOSIS — I5032 Chronic diastolic (congestive) heart failure: Secondary | ICD-10-CM | POA: Diagnosis present

## 2024-03-13 DIAGNOSIS — R4789 Other speech disturbances: Secondary | ICD-10-CM | POA: Diagnosis not present

## 2024-03-13 DIAGNOSIS — M109 Gout, unspecified: Secondary | ICD-10-CM | POA: Diagnosis present

## 2024-03-13 DIAGNOSIS — R531 Weakness: Secondary | ICD-10-CM

## 2024-03-13 DIAGNOSIS — Z7401 Bed confinement status: Secondary | ICD-10-CM

## 2024-03-13 DIAGNOSIS — Q2112 Patent foramen ovale: Secondary | ICD-10-CM

## 2024-03-13 DIAGNOSIS — Z781 Physical restraint status: Secondary | ICD-10-CM

## 2024-03-13 DIAGNOSIS — I130 Hypertensive heart and chronic kidney disease with heart failure and stage 1 through stage 4 chronic kidney disease, or unspecified chronic kidney disease: Secondary | ICD-10-CM | POA: Diagnosis present

## 2024-03-13 LAB — COMPREHENSIVE METABOLIC PANEL WITH GFR
ALT: 11 U/L (ref 0–44)
AST: 22 U/L (ref 15–41)
Albumin: 2.5 g/dL — ABNORMAL LOW (ref 3.5–5.0)
Alkaline Phosphatase: 58 U/L (ref 38–126)
Anion gap: 12 (ref 5–15)
BUN: 25 mg/dL — ABNORMAL HIGH (ref 8–23)
CO2: 24 mmol/L (ref 22–32)
Calcium: 8.9 mg/dL (ref 8.9–10.3)
Chloride: 104 mmol/L (ref 98–111)
Creatinine, Ser: 1.94 mg/dL — ABNORMAL HIGH (ref 0.44–1.00)
GFR, Estimated: 27 mL/min — ABNORMAL LOW (ref >60)
Glucose, Bld: 126 mg/dL — ABNORMAL HIGH (ref 70–99)
Potassium: 3.5 mmol/L (ref 3.5–5.1)
Sodium: 140 mmol/L (ref 135–145)
Total Bilirubin: 0.5 mg/dL (ref 0.0–1.2)
Total Protein: 6.5 g/dL (ref 6.5–8.1)

## 2024-03-13 LAB — CBC
HCT: 43.1 % (ref 36.0–46.0)
Hemoglobin: 13.4 g/dL (ref 12.0–15.0)
MCH: 28.6 pg (ref 26.0–34.0)
MCHC: 31.1 g/dL (ref 30.0–36.0)
MCV: 91.9 fL (ref 80.0–100.0)
Platelets: 172 10*3/uL (ref 150–400)
RBC: 4.69 MIL/uL (ref 3.87–5.11)
RDW: 15.3 % (ref 11.5–15.5)
WBC: 4.5 10*3/uL (ref 4.0–10.5)
nRBC: 0 % (ref 0.0–0.2)

## 2024-03-13 LAB — DIFFERENTIAL
Abs Immature Granulocytes: 0 10*3/uL (ref 0.00–0.07)
Basophils Absolute: 0 10*3/uL (ref 0.0–0.1)
Basophils Relative: 1 %
Eosinophils Absolute: 0 10*3/uL (ref 0.0–0.5)
Eosinophils Relative: 1 %
Immature Granulocytes: 0 %
Lymphocytes Relative: 43 %
Lymphs Abs: 1.9 10*3/uL (ref 0.7–4.0)
Monocytes Absolute: 0.4 10*3/uL (ref 0.1–1.0)
Monocytes Relative: 9 %
Neutro Abs: 2.1 10*3/uL (ref 1.7–7.7)
Neutrophils Relative %: 46 %

## 2024-03-13 LAB — I-STAT VENOUS BLOOD GAS, ED
Acid-Base Excess: 0 mmol/L (ref 0.0–2.0)
Bicarbonate: 24.8 mmol/L (ref 20.0–28.0)
Calcium, Ion: 0.98 mmol/L — ABNORMAL LOW (ref 1.15–1.40)
HCT: 35 % — ABNORMAL LOW (ref 36.0–46.0)
Hemoglobin: 11.9 g/dL — ABNORMAL LOW (ref 12.0–15.0)
O2 Saturation: 75 %
Potassium: 3.1 mmol/L — ABNORMAL LOW (ref 3.5–5.1)
Sodium: 142 mmol/L (ref 135–145)
TCO2: 26 mmol/L (ref 22–32)
pCO2, Ven: 40.3 mmHg — ABNORMAL LOW (ref 44–60)
pH, Ven: 7.398 (ref 7.25–7.43)
pO2, Ven: 40 mmHg (ref 32–45)

## 2024-03-13 LAB — I-STAT CHEM 8, ED
BUN: 27 mg/dL — ABNORMAL HIGH (ref 8–23)
Calcium, Ion: 1 mmol/L — ABNORMAL LOW (ref 1.15–1.40)
Chloride: 104 mmol/L (ref 98–111)
Creatinine, Ser: 1.9 mg/dL — ABNORMAL HIGH (ref 0.44–1.00)
Glucose, Bld: 125 mg/dL — ABNORMAL HIGH (ref 70–99)
HCT: 41 % (ref 36.0–46.0)
Hemoglobin: 13.9 g/dL (ref 12.0–15.0)
Potassium: 3.5 mmol/L (ref 3.5–5.1)
Sodium: 140 mmol/L (ref 135–145)
TCO2: 26 mmol/L (ref 22–32)

## 2024-03-13 LAB — AMMONIA: Ammonia: 13 umol/L (ref 9–35)

## 2024-03-13 LAB — PROTIME-INR
INR: 1 (ref 0.8–1.2)
Prothrombin Time: 13.4 s (ref 11.4–15.2)

## 2024-03-13 LAB — CBG MONITORING, ED: Glucose-Capillary: 127 mg/dL — ABNORMAL HIGH (ref 70–99)

## 2024-03-13 LAB — ETHANOL: Alcohol, Ethyl (B): <15 mg/dL (ref <15)

## 2024-03-13 LAB — APTT: aPTT: 34 s (ref 24–36)

## 2024-03-13 MED ORDER — HYDRALAZINE HCL 20 MG/ML IJ SOLN: 5.0000 mg | Freq: Three times a day (TID) | INTRAMUSCULAR | Status: DC | PRN

## 2024-03-13 MED ORDER — ALLOPURINOL 100 MG PO TABS
100.0000 mg | ORAL_TABLET | Freq: Every morning | ORAL | Status: DC
  Administered 2024-03-14 – 2024-03-16 (×3): 100 mg via ORAL
  Filled 2024-03-13 (×3): qty 1

## 2024-03-13 MED ORDER — SODIUM CHLORIDE 0.9 % IV SOLN: INTRAVENOUS | Status: DC

## 2024-03-13 MED ORDER — LORAZEPAM 2 MG/ML IJ SOLN
1.0000 mg | Freq: Once | INTRAMUSCULAR | Status: AC
  Administered 2024-03-13: 2 mg via INTRAVENOUS
  Filled 2024-03-13: qty 1

## 2024-03-13 MED ORDER — CLOPIDOGREL BISULFATE 75 MG PO TABS
75.0000 mg | ORAL_TABLET | Freq: Every day | ORAL | Status: DC
  Administered 2024-03-14 – 2024-03-20 (×7): 75 mg via ORAL
  Filled 2024-03-13 (×8): qty 1

## 2024-03-13 MED ORDER — ONDANSETRON HCL 4 MG PO TABS
4.0000 mg | ORAL_TABLET | Freq: Four times a day (QID) | ORAL | Status: DC | PRN
Start: 2024-03-13 — End: 2024-03-20

## 2024-03-13 MED ORDER — POTASSIUM CHLORIDE CRYS ER 20 MEQ PO TBCR: 40.0000 meq | EXTENDED_RELEASE_TABLET | Freq: Once | ORAL | Status: DC

## 2024-03-13 MED ORDER — ASPIRIN 81 MG PO CHEW
81.0000 mg | CHEWABLE_TABLET | Freq: Every morning | ORAL | Status: DC
Start: 2024-03-14 — End: 2024-03-17
  Administered 2024-03-14 – 2024-03-16 (×3): 81 mg via ORAL
  Filled 2024-03-13 (×3): qty 1

## 2024-03-13 MED ORDER — SODIUM CHLORIDE 0.9% FLUSH: 3.0000 mL | Freq: Once | INTRAVENOUS | Status: DC

## 2024-03-13 MED ORDER — LORAZEPAM 1 MG PO TABS: 1.0000 mg | ORAL_TABLET | Freq: Once | ORAL | Status: DC

## 2024-03-13 MED ORDER — ACETAMINOPHEN 325 MG PO TABS
650.0000 mg | ORAL_TABLET | Freq: Four times a day (QID) | ORAL | Status: DC | PRN
  Administered 2024-03-17: 650 mg via ORAL
  Filled 2024-03-13 (×2): qty 2

## 2024-03-13 MED ORDER — IOHEXOL 350 MG/ML SOLN
100.0000 mL | Freq: Once | INTRAVENOUS | Status: AC | PRN
  Administered 2024-03-13: 100 mL via INTRAVENOUS

## 2024-03-13 MED ORDER — LORAZEPAM 1 MG PO TABS
1.0000 mg | ORAL_TABLET | ORAL | Status: AC | PRN
  Administered 2024-03-13: 1 mg via ORAL
  Filled 2024-03-13: qty 1

## 2024-03-13 MED ORDER — ONDANSETRON HCL 4 MG/2ML IJ SOLN
4.0000 mg | Freq: Four times a day (QID) | INTRAMUSCULAR | Status: DC | PRN
Start: 2024-03-13 — End: 2024-03-20

## 2024-03-13 MED ORDER — ACETAMINOPHEN 650 MG RE SUPP: 650.0000 mg | Freq: Four times a day (QID) | RECTAL | Status: DC | PRN

## 2024-03-13 MED ORDER — LEVOTHYROXINE SODIUM 100 MCG PO TABS
100.0000 ug | ORAL_TABLET | Freq: Every day | ORAL | Status: DC
  Administered 2024-03-14 – 2024-03-20 (×6): 100 ug via ORAL
  Filled 2024-03-13 (×6): qty 1

## 2024-03-13 NOTE — Progress Notes (Signed)
 MRI Attempted. Patient was unable to lay still or lay flat and could not tolerate exam.

## 2024-03-13 NOTE — ED Triage Notes (Signed)
 Pt BIB [facility] from home as Code Stroke, LKN 2200 last night, was noted by daughter at 1000 this morning to have Rt sided facial droop, Rt arm weakness, dysarthria, dysphasia & was Altered only alert to self. EMS reports generalized weakness for the last few days & is reported she had an "ammonia-like smell" on scene. 176/100, 68 bpm, 96% on RA, 20 resp, CBG 133, 18g Lt AC.

## 2024-03-13 NOTE — Progress Notes (Signed)
 [facility] night cross cover note:   I updated patient's diet order to regular after passing swallow screen earlier.     Camelia Cavalier, DO Hospitalist

## 2024-03-13 NOTE — ED Provider Notes (Addendum)
 [facility] AT [facility] Provider Note   CSN: [id number] Arrival date & time: 03/13/24  1123     History  Chief Complaint  Patient presents with   Code Stroke    Kayla Hunt is a 73 y.o. female.  HPI   73 year old female with medical history significant for CHF, DM 2, HTN, HLD, seizures in the setting of posterior reversible encephalopathy syndrome to the emergency department as a code stroke.  The history is provided by EMS as well as the patient's daughter.  Patient has been mildly confused over the past few days with generalized weakness, slurred speech and difficulty with words.  Last known normal was 10 PM last night.  She seemed normal last night.  She then developed right-sided facial droop and right hand grip weakness.  This was noted this morning and code stroke was activated prior to patient arrival.  On arrival, the patient CBG was 133, airway was cleared at the bridge and the patient was taken to the CT of the CT scanner for code stroke imaging.  Home Medications Prior to Admission medications   Medication Sig Start Date End Date Taking? Authorizing Provider  allopurinol  (ZYLOPRIM ) 100 MG tablet TAKE 1 TABLET(100 MG) BY MOUTH DAILY 09/26/20   Marius Siemens, NP  Blood Glucose Monitoring Suppl (TRUE METRIX AIR GLUCOSE METER) DEVI 1 Bag by Does not apply route 3 (three) times daily as needed. 10/09/19   Marius Siemens, NP  Blood Pressure Monitoring KIT 1 kit by Does not apply route 3 (three) times a week. 12/16/19   Marius Siemens, NP  chlorthalidone  (HYGROTON ) 50 MG tablet TAKE 1 TABLET(50 MG) BY MOUTH DAILY 01/27/21   Marius Siemens, NP  glipiZIDE  (GLUCOTROL ) 5 MG tablet Take 5 mg by mouth 2 (two) times daily. 01/27/21   [provider]  glucose blood test strip Check blood sugar once daily 09/11/19   Marius Siemens, NP  hydrALAZINE  (APRESOLINE ) 50 MG tablet Take 1 tablet (50 mg total) by mouth 2 (two) times  daily. 01/27/21   Marius Siemens, NP  levothyroxine  (SYNTHROID ) 112 MCG tablet Take 1 tablet (112 mcg total) by mouth daily. 11/20/19 11/19/20  Marius Siemens, NP  lisinopril  (ZESTRIL ) 20 MG tablet Take 1 tablet (20 mg total) by mouth daily. 01/27/21   Marius Siemens, NP  loratadine  (CLARITIN ) 10 MG tablet TAKE 1 TABLET(10 MG) BY MOUTH DAILY AS NEEDED FOR ALLERGIES 06/25/20   Marius Siemens, NP  losartan -hydrochlorothiazide  (HYZAAR) 50-12.5 MG tablet Take 1 tablet by mouth daily. 03/23/21   [provider]  metoprolol  tartrate (LOPRESSOR ) 50 MG tablet TAKE 1 TABLET(50 MG) BY MOUTH TWICE DAILY 09/26/20   Marius Siemens, NP  simvastatin  (ZOCOR ) 40 MG tablet Take 0.5 tablets (20 mg total) by mouth daily. 11/20/19 01/19/20  Marius Siemens, NP  SitaGLIPtin -MetFORMIN  HCl 50-1000 MG TB24 Take 50-1,000 mg by mouth daily after breakfast. 01/27/21   Marius Siemens, NP  TRUEplus Lancets 26G MISC 1 Bottle by Does not apply route daily. Test blood sugar once daily 09/11/19   Marius Siemens, NP      Allergies    Patient has no known allergies.    Review of Systems   Review of Systems  Unable to perform ROS: Acuity of condition    Physical Exam Updated Vital Signs BP (!) 162/97   Pulse 65   Temp 98.9 F (37.2 C)   Resp 18  SpO2 99%  Physical Exam Vitals and nursing note reviewed.  Constitutional:      General: She is not in acute distress. HENT:     Head: Normocephalic and atraumatic.  Eyes:     Conjunctiva/sclera: Conjunctivae normal.     Pupils: Pupils are equal, round, and reactive to light.  Cardiovascular:     Rate and Rhythm: Normal rate and regular rhythm.  Pulmonary:     Effort: Pulmonary effort is normal. No respiratory distress.  Abdominal:     General: There is no distension.     Tenderness: There is no abdominal tenderness. There is no guarding.     Comments: No asterixis  Musculoskeletal:        General: No deformity or signs of  injury.     Cervical back: Neck supple.  Skin:    Findings: No lesion or rash.  Neurological:     Mental Status: She is alert. She is disoriented.     Comments: Patient GCS 14, disoriented per daughter who is bedside, AAOx1, no cranial nerve deficit, intact strength and sensation of the bilateral upper and lower extremities.     ED Results / Procedures / Treatments   Labs (all labs ordered are listed, but only abnormal results are displayed) Labs Reviewed  COMPREHENSIVE METABOLIC PANEL WITH GFR - Abnormal; Notable for the following components:      Result Value   Glucose, Bld 126 (*)    BUN 25 (*)    Creatinine, Ser 1.94 (*)    Albumin 2.5 (*)    GFR, Estimated 27 (*)    All other components within normal limits  I-STAT CHEM 8, ED - Abnormal; Notable for the following components:   BUN 27 (*)    Creatinine, Ser 1.90 (*)    Glucose, Bld 125 (*)    Calcium , Ion 1.00 (*)    All other components within normal limits  CBG MONITORING, ED - Abnormal; Notable for the following components:   Glucose-Capillary 127 (*)    All other components within normal limits  I-STAT VENOUS BLOOD GAS, ED - Abnormal; Notable for the following components:   pCO2, Ven 40.3 (*)    Potassium 3.1 (*)    Calcium , Ion 0.98 (*)    HCT 35.0 (*)    Hemoglobin 11.9 (*)    All other components within normal limits  PROTIME-INR  APTT  CBC  DIFFERENTIAL  ETHANOL  AMMONIA  URINALYSIS, COMPLETE (UACMP) WITH MICROSCOPIC    EKG EKG Interpretation Date/Time:  Wednesday Mar 13 2024 12:04:54 EDT Ventricular Rate:  65 PR Interval:  176 QRS Duration:  98 QT Interval:  419 QTC Calculation: 436 R Axis:   78  Text Interpretation: Sinus rhythm Nonspecific T abnormalities, diffuse leads Confirmed by Rosealee Concha ([id number]) on 03/13/2024 12:10:16 PM  Radiology DG Chest 1 View Result Date: 03/13/2024 CLINICAL DATA:  Stroke.  History of CHF. EXAM: CHEST  1 VIEW COMPARISON:  Chest radiograph dated 05/05/2011.  FINDINGS: No focal consolidation, pleural effusion, pneumothorax. Borderline cardiomegaly. No acute osseous pathology. IMPRESSION: 1. No active disease. 2. Borderline cardiomegaly. Electronically Signed   By: Angus Bark M.D.   On: 03/13/2024 14:24   CT ANGIO HEAD NECK W WO CM W PERF (CODE STROKE) Result Date: 03/13/2024 CLINICAL DATA:  73 year old female code stroke presentation. Dysarthria, weakness. Questionable hyperdense left MCA on plain head CT. EXAM: CT ANGIOGRAPHY HEAD AND NECK CT PERFUSION BRAIN TECHNIQUE: Multidetector CT imaging of the head and neck was performed using  the standard protocol during bolus administration of intravenous contrast. Multiplanar CT image reconstructions and MIPs were obtained to evaluate the vascular anatomy. Carotid stenosis measurements (when applicable) are obtained utilizing NASCET criteria, using the distal internal carotid diameter as the denominator. Multiphase CT imaging of the brain was performed following IV bolus contrast injection. Subsequent parametric perfusion maps were calculated using RAPID software. RADIATION DOSE REDUCTION: This exam was performed according to the departmental dose-optimization program which includes automated exposure control, adjustment of the mA and/or kV according to patient size and/or use of iterative reconstruction technique. CONTRAST:  OMNIPAQUE IOHEXOL 350 MG/ML SOLN COMPARISON:  Plain head CT 1135 hours today. FINDINGS: CT Brain Perfusion Findings: ASPECTS: 10 (chronic encephalomalacia in the right hemisphere). CTP is motion degraded. CBF (<30%) Volume: 0mL, with no CBF for CBV parameter abnormality detected. Perfusion (Tmax>6.0s) volume: 11mL, although right temporal lobe, inferior frontal gyrus Mismatch Volume: Up to 11mL Infarction Location:No core infarct detected. CTA NECK Skeleton: Carious and absent dentition. No acute osseous abnormality identified. Upper chest: Negative. Other neck: Nonvascular neck soft tissue  spaces appear within normal limits. Aortic arch: Tortuous aortic arch with soft and calcified plaque. Three vessel arch configuration. Right carotid system: Brachiocephalic artery tortuosity, soft and calcified plaque without stenosis. Tortuous proximal right CCA with mild calcified plaque, no stenosis. Mild distal right CCA and mild right carotid bifurcation calcified plaque without stenosis. Mild right carotid bulb calcified plaque. No significant right ICA stenosis to the skull base. Left carotid system: Similar tortuosity and mild atherosclerosis without hemodynamically significant stenosis. Vertebral arteries: Tortuous brachiocephalic artery with soft plaque, no stenosis. Calcified plaque at the right vertebral artery origin on series 5, image 137 with moderate stenosis. The right vertebral remains patent to the skull base and is tortuous in the neck with mild additional calcified plaque, no other significant stenosis. Proximal left subclavian artery is tortuous with soft plaque. The left vertebral artery origin is patent but appears stenotic, with highly diminutive and poorly enhancing left V1 vertebral artery (series 7, image 253). Largely occluded left V2 segment. Reconstitution of the left V3 segment from a muscular branch in the upper neck. Distal left V3 segment appears irregular and stenotic at the skull base. CTA HEAD Posterior circulation: Left V4 segment appears functionally occluded, minimally enhancing. Right vertebral V4 is patent and supplies the basilar without stenosis. Patent basilar artery. Patent SCA and PCA origins. Small left posterior communicating artery. Left PCA branches are patent with mild irregularity, mild to moderate left P3 stenosis series 13, image 26. Right PCA branches are patent with mild irregularity. Anterior circulation: Both ICA siphons are patent. Left siphon calcified plaque is mild to moderate, with only mild left siphon stenosis. Normal left posterior communicating  artery origin. Right siphon is tortuous with mild to moderate calcified plaque and mild to moderate stenosis at the anterior genu series 10, image 100. Patent carotid termini. Patent MCA and ACA origins. Tortuous A1 segments. Right A1 appears mildly dominant. Normal anterior communicating artery. Bilateral ACA branches are patent. There is mild right A2 irregularity and stenosis on series 13, image 23. Right MCA M1 segment is tortuous. Right MCA bifurcation is patent without stenosis. Right MCA branches appear patent. Left MCA M1 segment is tortuous. Left MCA bifurcation is patent. Left MCA branches appear patent. No branch stenosis or occlusion identified. Venous sinuses: Early contrast timing, not evaluated. Anatomic variants: Affectively dominant right vertebral artery. Mildly dominant right ACA A1. Review of the MIP images confirms the above findings Preliminary  report of the CTA discussed by telephone with Dr. ASHISH ARORA on 03/13/2024 at 1201 hours. IMPRESSION: 1. Motion degraded CTP. No infarct core detected, questionable 11 mL oligemia primarily in the right temporal lobe. 2. 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 Only the V3 segment demonstrates relatively normal enhancement, due to reconstitution from a muscular branch there. 3. Right Vertebral Artery supplies the basilar, with Moderate origin stenosis due to calcified plaque. 4. No extracranial carotid stenosis. Mild to moderate ICA siphon plaque with mild to moderate right ICA anterior genu stenosis. No other hemodynamically significant anterior or posterior circulation stenosis. 5.  Aortic Atherosclerosis (ICD10-I70.0). Electronically Signed   By: Marlise Simpers M.D.   On: 03/13/2024 12:20   CT HEAD CODE STROKE WO CONTRAST Result Date: 03/13/2024 CLINICAL DATA:  Code stroke. 73 year old female with right side deficit. EXAM: CT HEAD WITHOUT CONTRAST TECHNIQUE: Contiguous axial  images were obtained from the base of the skull through the vertex without intravenous contrast. RADIATION DOSE REDUCTION: This exam was performed according to the departmental dose-optimization program which includes automated exposure control, adjustment of the mA and/or kV according to patient size and/or use of iterative reconstruction technique. COMPARISON:  Brain MRI 05/06/2011.  Head CT 05/05/2011. FINDINGS: Brain: Cerebral volume not significantly changed since 2012. No midline shift, ventriculomegaly, mass effect, evidence of mass lesion, intracranial hemorrhage or evidence of cortically based acute infarction. Patchy bilateral cerebral white matter hypodensity which is fairly symmetric. However, there is chronic cortical encephalomalacia in the posterior and lateral right temporal lobe, right occipital pole stable since 2012. See series 3, images 15 through 20. Vascular: Questionable hyperdense left MCA branch in the sylvian fissure series 6, image 41. Skull: Intact.  No acute osseous abnormality identified. Sinuses/Orbits: Progressed chronic right maxillary sinus disease with mucoperiosteal thickening. Chronic right middle ear and mastoid opacification is stable. Other: No significant gaze deviation. Visualized orbits and scalp soft tissues are within normal limits. ASPECTS [facility] Stroke Program Early CT Score) - Ganglionic level infarction (caudate, lentiform nuclei, internal capsule, insula, M1-M3 cortex): - Supraganglionic infarction (M4-M6 cortex): Total score (0-10 with 10 being normal): IMPRESSION: 1. Questionable Hyperdense Left MCA branch in the Sylvian fissure. But no cortically based acute infarct or acute intracranial hemorrhage identified. ASPECTS 10. 2. 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 Other chronic white matter disease. These results were communicated to Dr. Arora at 11:42 am on  03/13/2024 by text page via the [facility] messaging system. Electronically Signed   By: Marlise Simpers M.D.   On: 03/13/2024 11:43    Procedures Procedures    Medications Ordered in ED Medications  sodium chloride  flush (NS) 0.9 % injection 3 mL (0 mLs Intravenous Hold 03/13/24 1227)  iohexol (OMNIPAQUE) 350 MG/ML injection 100 mL (100 mLs Intravenous Contrast Given 03/13/24 1154)  LORazepam (ATIVAN) tablet 1 mg (1 mg Oral Given 03/13/24 1423)    ED Course/ Medical Decision Making/ A&P                                 Medical Decision Making Amount and/or Complexity of Data Reviewed Labs: ordered. Radiology: ordered.  Risk Prescription drug management. Decision regarding hospitalization.    73 year old female with medical history significant for CHF, DM 2, HTN, HLD, seizures in the setting of posterior reversible  encephalopathy syndrome to the emergency department as a code stroke.  The history is provided by EMS as well as the patient's daughter.  Patient has been mildly confused over the past few days with generalized weakness, slurred speech and difficulty with words.  Last known normal was 10 PM last night.  She seemed normal last night.  She then developed right-sided facial droop and right hand grip weakness.  This was noted this morning and code stroke was activated prior to patient arrival.  On arrival, the patient CBG was 133, airway was cleared at the bridge and the patient was taken to the CT of the CT scanner for code stroke imaging.  On arrival, the patient was vitally stable, not tachycardic or tachypneic, hypertensive BP 163/90, saturating 92 to 100% on room air.  Physical exam generally unremarkable.  Patient presenting with confusion, altered mental status, some strokelike symptoms.  Presenting as a code stroke.  CT head: IMPRESSION:  1. Questionable Hyperdense Left MCA branch in the Sylvian fissure.  But no cortically based acute infarct or acute intracranial  hemorrhage  identified. ASPECTS 10.    2. Chronic encephalomalacia in the right temporal and occipital  lobes, stable since 2012 and might be a combination of chronic  trauma and ischemia. Other chronic white matter disease.    These results were communicated to Dr. Arora at 11:42 am on  03/13/2024 by text page via the [facility] messaging system.    CTA Head and Neck: IMPRESSION:  1. Motion degraded CTP. No infarct core detected, questionable 11 mL  oligemia primarily in the right temporal lobe.    2. 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 Only the V3  segment demonstrates relatively normal enhancement, due to  reconstitution from a muscular branch there.    3. Right Vertebral Artery supplies the basilar, with Moderate origin  stenosis due to calcified plaque.    4. No extracranial carotid stenosis. Mild to moderate ICA siphon  plaque with mild to moderate right ICA anterior genu stenosis. No  other hemodynamically significant anterior or posterior circulation  stenosis.    5.  Aortic Atherosclerosis (ICD10-I70.0).    CXR: Pending  MRI Brain: Pending  Labs: CBG 127, CBC without a leukocytosis or anemia, CMP with creatinine at baseline at 1.94, no significant electrolyte abnormality, INR normal, ethanol level normal,  Ammonia negative.  Urinalysis pending.  VBG without an acidosis or alkalosis.  Chest x-ray: IMPRESSION:  1. No active disease.  2. Borderline cardiomegaly.   Ativan ordered for MRI imaging, was informed by neurology the patient would not stay still for MRI imaging and it was unable to be obtained.  Neuro recommended hospitalist medicine admission for further evaluation and management.  Dr. Nichole Barker consulted and accepting.   Final Clinical Impression(s) / ED Diagnoses Final diagnoses:  Altered mental status, unspecified altered mental status type  Stroke-like symptoms    Rx / DC Orders ED  Discharge Orders     None         Rosealee Concha, MD 03/13/24 1413    Rosealee Concha, MD 03/13/24 1511

## 2024-03-13 NOTE — ED Notes (Signed)
 Patient transported to MRI

## 2024-03-13 NOTE — Progress Notes (Signed)
   03/13/24 1820  Vitals  Temp 97.7 F (36.5 C)  Temp Source Oral  BP (!) 195/106  MAP (mmHg) 130  BP Location Right Arm  BP Method Automatic  Patient Position (if appropriate) Lying  Pulse Rate 88  Pulse Rate Source Monitor  ECG Heart Rate 91  Resp 20  Level of Consciousness  Level of Consciousness Responds to Voice (recieved IV ativan for MRI, patient is still sedatred)  MEWS COLOR  MEWS Score Color Green  Oxygen Therapy  SpO2 98 %  O2 Device Room Air  Pain Assessment  Pain Scale Faces  Faces Pain Scale 0  MEWS Score  MEWS Temp 0  MEWS Systolic 0  MEWS Pulse 0  MEWS RR 0  MEWS LOC 1  MEWS Score 1   Patient arrived from ED via transport. Patient unable to respond but to voice during nurses initial assessment. Patient did received IV ativan for brain MRI prior. Nichole Barker MD at bedside and made aware of mentation. Akula MD made aware of elevated BP and has put in prn orders for BP management. Tele monitor applied and CCMD notified. Bed alarm on and in lowest position. Call bell within reach. No family at bedside.

## 2024-03-13 NOTE — ED Notes (Signed)
Transport called for pt.

## 2024-03-13 NOTE — H&P (Signed)
 History and Physical    Patient: Kayla Hunt JWJ:[id number] DOB: 08/30/1951 DOA: 03/13/2024 DOS: the patient was seen and examined on 03/13/2024 PCP: Marius Siemens, NP  Patient coming from: Home  Chief Complaint:  Chief Complaint  Patient presents with   Code Stroke   HPI: Kayla Hunt is a 73 y.o. female with medical history significant of hypertension, hyperlipidemia. Hypothyroidism, seizures, type 2 DM , chronic diastolic CHF, stage 4 CKD, was brought in from home as a code stroke. As per the daughter, she has been confused, with slurred speech and word finding difficulty and has generalized weakness, with right sided facial droop and right sided weakness. Last known normal was last night at 10 pm. 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 Neurology on board and recommended admission for further evaluation.  Labs are remarkable for creatinine of 1.9 and potassium of 3.1 .  EKG shows non specific t wave abnormalities.    Review of Systems: unable to review all systems due to the inability of the patient to answer questions. Past Medical History:  Diagnosis Date   Anemia    Anxiety    Arthritis    "legs" (04/12/2018)   CHF (congestive heart failure) (HCC)    Chronic back pain    Chronic edema    bilat LE's   CKD (chronic kidney disease), stage IV (HCC)    Maximo Spar 04/12/2018   Depression    Gout    Hyperlipidemia    Hypertension    Hypothyroidism    Seizures (HCC) 05/05/2011   Maximo Spar 05/05/2011   Type II diabetes mellitus (HCC)    Past Surgical History:  Procedure Laterality Date   NO PAST SURGERIES     Social History:  reports that she has never smoked. Her smokeless tobacco use includes snuff. She reports that she does not drink alcohol and does not use drugs.  No Known Allergies  Family History  Family history  unknown: Yes    Prior to Admission medications   Medication Sig Start Date End Date Taking? Authorizing Provider  allopurinol  (ZYLOPRIM ) 100 MG tablet TAKE 1 TABLET(100 MG) BY MOUTH DAILY Patient taking differently: Take 100 mg by mouth in the morning. 09/26/20  Yes Marius Siemens, NP  aspirin 81 MG chewable tablet Chew 81 mg by mouth in the morning.   Yes [provider]  bismuth subsalicylate (PEPTO BISMOL) 262 MG/15ML suspension Take 30 mLs by mouth as needed for indigestion or diarrhea or loose stools.   Yes [provider]  cetirizine (ZYRTEC) 10 MG chewable tablet Chew 10 mg by mouth in the morning.   Yes [provider]  hydrALAZINE  (APRESOLINE ) 50 MG tablet Take 1 tablet (50 mg total) by mouth 2 (two) times daily. 01/27/21  Yes Marius Siemens, NP  levothyroxine  (SYNTHROID ) 100 MCG tablet Take 100 mcg by mouth daily before breakfast.   Yes [provider]  losartan -hydrochlorothiazide  (HYZAAR) 50-12.5 MG tablet Take 1 tablet by mouth in the morning. 03/23/21  Yes [provider]  metoprolol  tartrate (LOPRESSOR ) 50 MG tablet TAKE 1 TABLET(50 MG) BY MOUTH TWICE DAILY Patient taking differently: Take 50 mg by mouth 2 (two) times daily. 09/26/20  Yes Marius Siemens, NP  Blood Glucose Monitoring Suppl (TRUE METRIX AIR GLUCOSE METER) DEVI 1 Bag by Does not apply route 3 (three) times daily as needed.  10/09/19   Marius Siemens, NP  Blood Pressure Monitoring KIT 1 kit by Does not apply route 3 (three) times a week. 12/16/19   Marius Siemens, NP  glucose blood test strip Check blood sugar once daily 09/11/19   Marius Siemens, NP  TRUEplus Lancets 26G MISC 1 Bottle by Does not apply route daily. Test blood sugar once daily 09/11/19   Marius Siemens, NP    Physical Exam: Vitals:   03/13/24 1545 03/13/24 1600 03/13/24 1615 03/13/24 1630  BP: (!) 154/88 (!) 158/77 (!) 141/83 (!) 159/96  Pulse: 72 67 69 73  Resp: 17 (!) 21  (!) 21 19  Temp:      SpO2: 98% 96% 94% 93%   General exam: Appears calm and comfortable  Respiratory system: Clear to auscultation. Respiratory effort normal. Cardiovascular system: S1 & S2 heard, RRR. No JVD,  Gastrointestinal system: Abdomen is nondistended, soft and nontender.  Central nervous system: lethargic, opening eyes to verbal cues only. Able to move all extremities.  Extremities: warm extremities.  Skin: No rashes,  Psychiatry: sleepy and lethargic.   Data Reviewed: Results for orders placed or performed during the hospital encounter of 03/13/24 (from the past 24 hours)  CBG monitoring, ED     Status: Abnormal   Collection Time: 03/13/24 11:27 AM  Result Value Ref Range   Glucose-Capillary 127 (H) 70 - 99 mg/dL  Protime-INR     Status: None   Collection Time: 03/13/24 11:32 AM  Result Value Ref Range   Prothrombin Time 13.4 11.4 - 15.2 seconds   INR 1.0 0.8 - 1.2  APTT     Status: None   Collection Time: 03/13/24 11:32 AM  Result Value Ref Range   aPTT 34 24 - 36 seconds  CBC     Status: None   Collection Time: 03/13/24 11:32 AM  Result Value Ref Range   WBC 4.5 4.0 - 10.5 K/uL   RBC 4.69 3.87 - 5.11 MIL/uL   Hemoglobin 13.4 12.0 - 15.0 g/dL   HCT 16.1 09.6 - 04.5 %   MCV 91.9 80.0 - 100.0 fL   MCH 28.6 26.0 - 34.0 pg   MCHC 31.1 30.0 - 36.0 g/dL   RDW 40.9 81.1 - 91.4 %   Platelets 172 150 - 400 K/uL   nRBC 0.0 0.0 - 0.2 %  Differential     Status: None   Collection Time: 03/13/24 11:32 AM  Result Value Ref Range   Neutrophils Relative % 46 %   Neutro Abs 2.1 1.7 - 7.7 K/uL   Lymphocytes Relative 43 %   Lymphs Abs 1.9 0.7 - 4.0 K/uL   Monocytes Relative 9 %   Monocytes Absolute 0.4 0.1 - 1.0 K/uL   Eosinophils Relative 1 %   Eosinophils Absolute 0.0 0.0 - 0.5 K/uL   Basophils Relative 1 %   Basophils Absolute 0.0 0.0 - 0.1 K/uL   Immature Granulocytes 0 %   Abs Immature Granulocytes 0.00 0.00 - 0.07 K/uL  Comprehensive metabolic panel     Status:  Abnormal   Collection Time: 03/13/24 11:32 AM  Result Value Ref Range   Sodium 140 135 - 145 mmol/L   Potassium 3.5 3.5 - 5.1 mmol/L   Chloride 104 98 - 111 mmol/L   CO2 24 22 - 32 mmol/L   Glucose, Bld 126 (H) 70 - 99 mg/dL   BUN 25 (H) 8 - 23 mg/dL   Creatinine, Ser 7.82 (H) 0.44 -  1.00 mg/dL   Calcium  8.9 8.9 - 10.3 mg/dL   Total Protein 6.5 6.5 - 8.1 g/dL   Albumin 2.5 (L) 3.5 - 5.0 g/dL   AST 22 15 - 41 U/L   ALT 11 0 - 44 U/L   Alkaline Phosphatase 58 38 - 126 U/L   Total Bilirubin 0.5 0.0 - 1.2 mg/dL   GFR, Estimated 27 (L) >60 mL/min   Anion gap 12 5 - 15  Ethanol     Status: None   Collection Time: 03/13/24 11:32 AM  Result Value Ref Range   Alcohol, Ethyl (B) <15 <15 mg/dL  I-stat chem 8, ED     Status: Abnormal   Collection Time: 03/13/24 11:34 AM  Result Value Ref Range   Sodium 140 135 - 145 mmol/L   Potassium 3.5 3.5 - 5.1 mmol/L   Chloride 104 98 - 111 mmol/L   BUN 27 (H) 8 - 23 mg/dL   Creatinine, Ser 4.01 (H) 0.44 - 1.00 mg/dL   Glucose, Bld 027 (H) 70 - 99 mg/dL   Calcium , Ion 1.00 (L) 1.15 - 1.40 mmol/L   TCO2 26 22 - 32 mmol/L   Hemoglobin 13.9 12.0 - 15.0 g/dL   HCT 25.3 66.4 - 40.3 %  Ammonia     Status: None   Collection Time: 03/13/24  2:13 PM  Result Value Ref Range   Ammonia 13 9 - 35 umol/L  I-Stat venous blood gas, ([facility], [facility], [facility])     Status: Abnormal   Collection Time: 03/13/24  2:20 PM  Result Value Ref Range   pH, Ven 7.398 7.25 - 7.43   pCO2, Ven 40.3 (L) 44 - 60 mmHg   pO2, Ven 40 32 - 45 mmHg   Bicarbonate 24.8 20.0 - 28.0 mmol/L   TCO2 26 22 - 32 mmol/L   O2 Saturation 75 %   Acid-Base Excess 0.0 0.0 - 2.0 mmol/L   Sodium 142 135 - 145 mmol/L   Potassium 3.1 (L) 3.5 - 5.1 mmol/L   Calcium , Ion 0.98 (L) 1.15 - 1.40 mmol/L   HCT 35.0 (L) 36.0 - 46.0 %   Hemoglobin 11.9 (L) 12.0 - 15.0 g/dL   Sample type VENOUS      Assessment and Plan:  Acute metabolic encephalopathy;  Differential include TIA vs PRES TIA work up in  progress. Get ammonia level, TSH .  Echocardiogram, ordered.   MRI brain with and without contrast pending.  CXR is negative.  Resume aspirin, statin.  Get lipid panel, A1c, .  SLP/PT/OT Eval .    Hypertension  Pt too lethargic to take oral meds.  Will start her on IV hydralazine  prn    Hypothyroidism:  Resume synthroid .  Get TSH.     Hyperlipidemia Get lipid panel.       Advance Care Planning:   Code Status: Full Code   Consults: neurology.   Family Communication: none at bedside.   Severity of Illness: The appropriate patient status for this patient is OBSERVATION. Observation status is judged to be reasonable and necessary in order to provide the required intensity of service to ensure the patient's safety. The patient's presenting symptoms, physical exam findings, and initial radiographic and laboratory data in the context of their medical condition is felt to place them at decreased risk for further clinical deterioration. Furthermore, it is anticipated that the patient will be medically stable for discharge from the hospital within 2 midnights of admission.   Author: Feliciana Horn, MD  03/13/2024 5:52 PM  For on call review [email or web address].

## 2024-03-13 NOTE — Consult Note (Signed)
 NEUROLOGY CONSULT NOTE   Date of service: Mar 13, 2024 Patient Name: Kayla Hunt MRN:  [id number] DOB:  1951/06/16 Chief Complaint: "Generalized weakness, slurred speech, right facial droop and possible aphasia" Requesting Provider: Rosealee Concha, MD  History of Present Illness  Kayla Hunt is a 73 y.o. 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 Daughter spoke with her 10 PM last night when she started normal.  This morning she had the symptoms described above.  Per EMR she also had some right-sided facial drooping and right hand grip weakness.  Due to aphasia as one of the symptoms, code stroke was activated She was hypertensive with systolic in the 170s for EMS.  EMS reported "ammonia like smell" on the scene.  CBG 133.  LKW: 10 PM 03/13/2023 Modified rankin score: Unable to reliably ascertain IV Thrombolysis: Outside the window EVT: No LVO  NIHSS components Score: Comment  1a Level of Conscious 0[x]  1[]  2[]  3[]      1b LOC Questions 0[]  1[x]  2[]       1c LOC Commands 0[x]  1[]  2[]       2 Best Gaze 0[x]  1[]  2[]       3 Visual 0[x]  1[]  2[]  3[]      4 Facial Palsy 0[x]  1[]  2[]  3[]      5a Motor Arm - left 0[x]  1[]  2[]  3[]  4[]  UN[]    5b Motor Arm - Right 0[x]  1[]  2[]  3[]  4[]  UN[]    6a Motor Leg - Left 0[x]  1[]  2[]  3[]  4[]  UN[]    6b Motor Leg - Right 0[x]  1[]  2[]  3[]  4[]  UN[]    7 Limb Ataxia 0[x]  1[]  2[]  UN[]      8 Sensory 0[x]  1[]  2[]  UN[]      9 Best Language 0[x]  1[]  2[]  3[]      10 Dysarthria 0[x]  1[]  2[]  UN[]      11 Extinct. and Inattention 0[x]  1[]  2[]       TOTAL: 1      ROS  Comprehensive ROS performed and pertinent positives documented in HPI    Past History   Past Medical History:  Diagnosis Date   Anemia     Anxiety    Arthritis    "legs" (04/12/2018)   CHF (congestive heart failure) (HCC)    Chronic back pain    Chronic edema    bilat LE's   CKD (chronic kidney disease), stage IV (HCC)    Maximo Spar 04/12/2018   Depression    Gout    Hyperlipidemia    Hypertension    Hypothyroidism    Seizures (HCC) 05/05/2011   Maximo Spar 05/05/2011   Type II diabetes mellitus (HCC)     Past Surgical History:  Procedure Laterality Date   NO PAST SURGERIES      Family History: Family History  Family history unknown: Yes    Social History  reports that she has never smoked. Her smokeless tobacco use includes snuff. She reports that she does not drink alcohol and does not use drugs.  No Known Allergies  Medications   Current Facility-Administered Medications:    sodium chloride  flush (NS) 0.9 % injection 3 mL, 3 mL, Intravenous, Once, Rosealee Concha, MD  Current Outpatient Medications:    allopurinol  (ZYLOPRIM ) 100 MG tablet, TAKE 1 TABLET(100 MG) BY MOUTH DAILY, Disp: 90 tablet, Rfl:  0   Blood Glucose Monitoring Suppl (TRUE METRIX AIR GLUCOSE METER) DEVI, 1 Bag by Does not apply route 3 (three) times daily as needed., Disp: 1 each, Rfl: 0   Blood Pressure Monitoring KIT, 1 kit by Does not apply route 3 (three) times a week., Disp: 1 kit, Rfl: kit   chlorthalidone  (HYGROTON ) 50 MG tablet, TAKE 1 TABLET(50 MG) BY MOUTH DAILY, Disp: 90 tablet, Rfl: 1   glipiZIDE  (GLUCOTROL ) 5 MG tablet, Take 5 mg by mouth 2 (two) times daily., Disp: , Rfl:    glucose blood test strip, Check blood sugar once daily, Disp: 100 each, Rfl: 12   hydrALAZINE  (APRESOLINE ) 50 MG tablet, Take 1 tablet (50 mg total) by mouth 2 (two) times daily., Disp: 180 tablet, Rfl: 3   levothyroxine  (SYNTHROID ) 112 MCG tablet, Take 1 tablet (112 mcg total) by mouth daily., Disp: 90 tablet, Rfl: 0   lisinopril  (ZESTRIL ) 20 MG tablet, Take 1 tablet (20 mg total) by mouth daily., Disp: 90 tablet, Rfl: 3   loratadine  (CLARITIN ) 10 MG tablet, TAKE  1 TABLET(10 MG) BY MOUTH DAILY AS NEEDED FOR ALLERGIES, Disp: 90 tablet, Rfl: 1   losartan -hydrochlorothiazide  (HYZAAR) 50-12.5 MG tablet, Take 1 tablet by mouth daily., Disp: , Rfl:    metoprolol  tartrate (LOPRESSOR ) 50 MG tablet, TAKE 1 TABLET(50 MG) BY MOUTH TWICE DAILY, Disp: 60 tablet, Rfl: 0   simvastatin  (ZOCOR ) 40 MG tablet, Take 0.5 tablets (20 mg total) by mouth daily., Disp: 90 tablet, Rfl: 1   SitaGLIPtin -MetFORMIN  HCl 50-1000 MG TB24, Take 50-1,000 mg by mouth daily after breakfast., Disp: 90 tablet, Rfl: 1   TRUEplus Lancets 26G MISC, 1 Bottle by Does not apply route daily. Test blood sugar once daily, Disp: 100 each, Rfl: 11  Vitals   Vitals:   Mar 31, 2024 1203 31-Mar-2024 1215 March 31, 2024 1300  BP: (!) 163/90 (!) 151/69 (!) 176/90  Pulse: 71 70 65  Resp: 14 14 14   SpO2: 92% 100% 100%    There is no height or weight on file to calculate BMI.  Physical Exam   General: Well-developed well-nourished in no distress HEENT: Normocephalic atraumatic Lungs: Clear Cardiovascular: Regular rhythm Neurological exam Awake alert oriented to self, could not tell me the month correctly.  Able to follow commands.  Naming comprehension repetition intact. Cranial nerves II to XII intact Motor examination with no drift in any of the fours Sensation intact to light touch Coordination exam shows no dysmetria  Labs/Imaging/Neurodiagnostic studies   CBC:  Recent Labs  Lab Mar 31, 2024 1132 03/31/24 1134  WBC 4.5  --   NEUTROABS 2.1  --   HGB 13.4 13.9  HCT 43.1 41.0  MCV 91.9  --   PLT 172  --    Basic Metabolic Panel:  Lab Results  Component Value Date   NA 140 2024/03/31   K 3.5 03-31-24   CO2 24 03-31-24   GLUCOSE 125 (H) 2024-03-31   BUN 27 (H) 03/31/2024   CREATININE 1.90 (H) 03-31-24   CALCIUM  8.9 03/31/2024   GFRNONAA 27 (L) 03/31/2024   GFRAA 32 (L) 02/24/2021   Lipid Panel:  Lab Results  Component Value Date   LDLCALC 322 (H) 02/24/2021   HgbA1c:  Lab Results   Component Value Date   HGBA1C 7.9 (A) 01/27/2021    Alcohol Level     Component Value Date/Time   [facility] <15 2024-03-31 1132   INR  Lab Results  Component Value Date   INR 1.0 03-31-2024   APTT  Lab Results  Component Value Date   APTT 34 03/13/2024     CT Head without contrast(Personally reviewed): Aspects 10.  On radiologist review, questionable hyperdense MCA in the left sylvian fissure.  CT angio Head and Neck with contrast(Personally reviewed): No ELVO.  Age-indeterminate left vertebral artery occlusion.  Only the V3 segment demonstrate relatively normal enhancement due to reconstitution from muscular branch there.  Right vertebral artery supplies the basilar with moderate origin stenosis due to calcified plaque.  ASSESSMENT   Kayla Hunt is a 73 y.o. female past history of CHF diabetes hypertension hyperlipidemia history of seizure in the setting of [place] reversible encephalopathy syndrome in the past presents for confusion, generalized weakness, slurred speech also-with concern for aphasia at home for which a code stroke was activated.  Last known well time 10 PM yesterday. No preceding illnesses or sicknesses. Given prior history of seizure with posterior reversible encephalopathy syndrome, an MRI to look for any changes in the brain either related to the above or a new stroke would be helpful.  A TIA cannot be excluded-other differentials include toxic metabolic encephalopathy  Impression: Transient right-sided weakness and speech difficulty, evaluate for hypertensive urgency.  Could be a TIA as well.  RECOMMENDATIONS  MRI of the brain without contrast UA Chest x-ray Further recommendations after imaging.  Addendum Patient unable to tolerate MRI I would recommend admission for evaluation of the above differentials  Recommendations: Admit to hospitalist Frequent rechecks Telemetry Aspirin 81+ Plavix 75 2D echo A1c Lipid panel A1c Therapy  assessments Check UA.  Chest x-ray unremarkable.  Stroke team to follow  ______________________________________________________________________    Signed, Tona Francis, MD [facility]

## 2024-03-14 ENCOUNTER — Observation Stay (HOSPITAL_COMMUNITY)

## 2024-03-14 DIAGNOSIS — N184 Chronic kidney disease, stage 4 (severe): Secondary | ICD-10-CM | POA: Diagnosis present

## 2024-03-14 DIAGNOSIS — N183 Chronic kidney disease, stage 3 unspecified: Secondary | ICD-10-CM | POA: Diagnosis not present

## 2024-03-14 DIAGNOSIS — R569 Unspecified convulsions: Secondary | ICD-10-CM

## 2024-03-14 DIAGNOSIS — E039 Hypothyroidism, unspecified: Secondary | ICD-10-CM | POA: Diagnosis not present

## 2024-03-14 DIAGNOSIS — Z79890 Hormone replacement therapy (postmenopausal): Secondary | ICD-10-CM | POA: Diagnosis not present

## 2024-03-14 DIAGNOSIS — Q2112 Patent foramen ovale: Secondary | ICD-10-CM | POA: Diagnosis not present

## 2024-03-14 DIAGNOSIS — G9341 Metabolic encephalopathy: Secondary | ICD-10-CM | POA: Diagnosis present

## 2024-03-14 DIAGNOSIS — I253 Aneurysm of heart: Secondary | ICD-10-CM | POA: Diagnosis present

## 2024-03-14 DIAGNOSIS — E785 Hyperlipidemia, unspecified: Secondary | ICD-10-CM | POA: Diagnosis present

## 2024-03-14 DIAGNOSIS — R2990 Unspecified symptoms and signs involving the nervous system: Secondary | ICD-10-CM | POA: Diagnosis not present

## 2024-03-14 DIAGNOSIS — Z6841 Body Mass Index (BMI) 40.0 and over, adult: Secondary | ICD-10-CM | POA: Diagnosis not present

## 2024-03-14 DIAGNOSIS — I509 Heart failure, unspecified: Secondary | ICD-10-CM | POA: Diagnosis not present

## 2024-03-14 DIAGNOSIS — Z7189 Other specified counseling: Secondary | ICD-10-CM | POA: Diagnosis not present

## 2024-03-14 DIAGNOSIS — Z781 Physical restraint status: Secondary | ICD-10-CM | POA: Diagnosis not present

## 2024-03-14 DIAGNOSIS — I5032 Chronic diastolic (congestive) heart failure: Secondary | ICD-10-CM | POA: Diagnosis present

## 2024-03-14 DIAGNOSIS — Z66 Do not resuscitate: Secondary | ICD-10-CM | POA: Diagnosis present

## 2024-03-14 DIAGNOSIS — Z720 Tobacco use: Secondary | ICD-10-CM | POA: Diagnosis not present

## 2024-03-14 DIAGNOSIS — I358 Other nonrheumatic aortic valve disorders: Secondary | ICD-10-CM | POA: Diagnosis not present

## 2024-03-14 DIAGNOSIS — Z7984 Long term (current) use of oral hypoglycemic drugs: Secondary | ICD-10-CM | POA: Diagnosis not present

## 2024-03-14 DIAGNOSIS — I130 Hypertensive heart and chronic kidney disease with heart failure and stage 1 through stage 4 chronic kidney disease, or unspecified chronic kidney disease: Secondary | ICD-10-CM | POA: Diagnosis present

## 2024-03-14 DIAGNOSIS — R4182 Altered mental status, unspecified: Secondary | ICD-10-CM | POA: Diagnosis not present

## 2024-03-14 DIAGNOSIS — Z7401 Bed confinement status: Secondary | ICD-10-CM | POA: Diagnosis not present

## 2024-03-14 DIAGNOSIS — M7989 Other specified soft tissue disorders: Secondary | ICD-10-CM | POA: Diagnosis not present

## 2024-03-14 DIAGNOSIS — Z515 Encounter for palliative care: Secondary | ICD-10-CM | POA: Diagnosis not present

## 2024-03-14 DIAGNOSIS — I517 Cardiomegaly: Secondary | ICD-10-CM | POA: Diagnosis not present

## 2024-03-14 DIAGNOSIS — I351 Nonrheumatic aortic (valve) insufficiency: Secondary | ICD-10-CM | POA: Diagnosis not present

## 2024-03-14 DIAGNOSIS — I6783 Posterior reversible encephalopathy syndrome: Secondary | ICD-10-CM | POA: Diagnosis not present

## 2024-03-14 DIAGNOSIS — E1122 Type 2 diabetes mellitus with diabetic chronic kidney disease: Secondary | ICD-10-CM | POA: Diagnosis present

## 2024-03-14 DIAGNOSIS — Z7982 Long term (current) use of aspirin: Secondary | ICD-10-CM | POA: Diagnosis not present

## 2024-03-14 DIAGNOSIS — G459 Transient cerebral ischemic attack, unspecified: Secondary | ICD-10-CM | POA: Diagnosis present

## 2024-03-14 DIAGNOSIS — M109 Gout, unspecified: Secondary | ICD-10-CM | POA: Diagnosis present

## 2024-03-14 DIAGNOSIS — E876 Hypokalemia: Secondary | ICD-10-CM | POA: Diagnosis present

## 2024-03-14 DIAGNOSIS — Z79899 Other long term (current) drug therapy: Secondary | ICD-10-CM | POA: Diagnosis not present

## 2024-03-14 DIAGNOSIS — I10 Essential (primary) hypertension: Secondary | ICD-10-CM | POA: Diagnosis not present

## 2024-03-14 LAB — LIPID PANEL
Cholesterol: 524 mg/dL — ABNORMAL HIGH (ref 0–200)
HDL: 64 mg/dL (ref >40)
LDL Cholesterol: 438 mg/dL — ABNORMAL HIGH (ref 0–99)
Total CHOL/HDL Ratio: 8.2 ratio
Triglycerides: 112 mg/dL (ref <150)
VLDL: 22 mg/dL (ref 0–40)

## 2024-03-14 LAB — HEMOGLOBIN A1C
Hgb A1c MFr Bld: 6.3 % — ABNORMAL HIGH (ref 4.8–5.6)
Mean Plasma Glucose: 134.11 mg/dL

## 2024-03-14 LAB — GLUCOSE, CAPILLARY
Glucose-Capillary: 63 mg/dL — ABNORMAL LOW (ref 70–99)
Glucose-Capillary: 84 mg/dL (ref 70–99)
Glucose-Capillary: 105 mg/dL — ABNORMAL HIGH (ref 70–99)
Glucose-Capillary: 133 mg/dL — ABNORMAL HIGH (ref 70–99)

## 2024-03-14 LAB — URINALYSIS, COMPLETE (UACMP) WITH MICROSCOPIC
Bacteria, UA: NONE SEEN
Bilirubin Urine: NEGATIVE
Glucose, UA: NEGATIVE mg/dL
Hgb urine dipstick: NEGATIVE
Ketones, ur: NEGATIVE mg/dL
Leukocytes,Ua: NEGATIVE
Nitrite: NEGATIVE
Protein, ur: >=300 mg/dL — AB
Specific Gravity, Urine: >1.046 — ABNORMAL HIGH (ref 1.005–1.030)
pH: 5 (ref 5.0–8.0)

## 2024-03-14 LAB — BASIC METABOLIC PANEL WITH GFR
Anion gap: 9 (ref 5–15)
BUN: 22 mg/dL (ref 8–23)
CO2: 27 mmol/L (ref 22–32)
Calcium: 8.9 mg/dL (ref 8.9–10.3)
Chloride: 104 mmol/L (ref 98–111)
Creatinine, Ser: 1.8 mg/dL — ABNORMAL HIGH (ref 0.44–1.00)
GFR, Estimated: 30 mL/min — ABNORMAL LOW (ref >60)
Glucose, Bld: 63 mg/dL — ABNORMAL LOW (ref 70–99)
Potassium: 3.3 mmol/L — ABNORMAL LOW (ref 3.5–5.1)
Sodium: 140 mmol/L (ref 135–145)

## 2024-03-14 LAB — ECHOCARDIOGRAM COMPLETE
AR max vel: 3.19 cm2
AV Area VTI: 3.33 cm2
AV Area mean vel: 3.03 cm2
AV Mean grad: 2.7 mmHg
AV Peak grad: 5.5 mmHg
Ao pk vel: 1.17 m/s
Area-P 1/2: 3.21 cm2
Calc EF: 71.8 %
S' Lateral: 2.7 cm
Single Plane A2C EF: 78.6 %
Single Plane A4C EF: 61.7 %

## 2024-03-14 LAB — CBC WITH DIFFERENTIAL/PLATELET
Abs Immature Granulocytes: 0.01 10*3/uL (ref 0.00–0.07)
Basophils Absolute: 0 10*3/uL (ref 0.0–0.1)
Basophils Relative: 1 %
Eosinophils Absolute: 0.1 10*3/uL (ref 0.0–0.5)
Eosinophils Relative: 2 %
HCT: 42.1 % (ref 36.0–46.0)
Hemoglobin: 13.3 g/dL (ref 12.0–15.0)
Immature Granulocytes: 0 %
Lymphocytes Relative: 59 %
Lymphs Abs: 2 10*3/uL (ref 0.7–4.0)
MCH: 27.9 pg (ref 26.0–34.0)
MCHC: 31.6 g/dL (ref 30.0–36.0)
MCV: 88.3 fL (ref 80.0–100.0)
Monocytes Absolute: 0.3 10*3/uL (ref 0.1–1.0)
Monocytes Relative: 8 %
Neutro Abs: 1 10*3/uL — ABNORMAL LOW (ref 1.7–7.7)
Neutrophils Relative %: 30 %
Platelets: 192 10*3/uL (ref 150–400)
RBC: 4.77 MIL/uL (ref 3.87–5.11)
RDW: 15.6 % — ABNORMAL HIGH (ref 11.5–15.5)
WBC: 3.4 10*3/uL — ABNORMAL LOW (ref 4.0–10.5)
nRBC: 0 % (ref 0.0–0.2)

## 2024-03-14 LAB — AMMONIA: Ammonia: 19 umol/L (ref 9–35)

## 2024-03-14 LAB — FOLATE: Folate: 11.8 ng/mL (ref >5.9)

## 2024-03-14 LAB — HIV ANTIBODY (ROUTINE TESTING W REFLEX): HIV Screen 4th Generation wRfx: NONREACTIVE

## 2024-03-14 LAB — TSH: TSH: 95.872 u[IU]/mL — ABNORMAL HIGH (ref 0.350–4.500)

## 2024-03-14 LAB — T4, FREE: Free T4: 0.32 ng/dL — ABNORMAL LOW (ref 0.61–1.12)

## 2024-03-14 LAB — VITAMIN B12: Vitamin B-12: 385 pg/mL (ref 180–914)

## 2024-03-14 MED ORDER — ATORVASTATIN CALCIUM 80 MG PO TABS
80.0000 mg | ORAL_TABLET | Freq: Every day | ORAL | Status: DC
  Administered 2024-03-14 – 2024-03-20 (×7): 80 mg via ORAL
  Filled 2024-03-14 (×7): qty 1

## 2024-03-14 MED ORDER — LOSARTAN POTASSIUM-HCTZ 50-12.5 MG PO TABS: 1.0000 | ORAL_TABLET | Freq: Every morning | ORAL | Status: DC

## 2024-03-14 MED ORDER — HYDRALAZINE HCL 50 MG PO TABS
50.0000 mg | ORAL_TABLET | Freq: Three times a day (TID) | ORAL | Status: DC
  Administered 2024-03-14 – 2024-03-20 (×16): 50 mg via ORAL
  Filled 2024-03-14 (×17): qty 1

## 2024-03-14 MED ORDER — LOSARTAN POTASSIUM 50 MG PO TABS
50.0000 mg | ORAL_TABLET | Freq: Every day | ORAL | Status: DC
  Administered 2024-03-14 – 2024-03-20 (×7): 50 mg via ORAL
  Filled 2024-03-14 (×7): qty 1

## 2024-03-14 MED ORDER — HYDRALAZINE HCL 50 MG PO TABS
50.0000 mg | ORAL_TABLET | Freq: Two times a day (BID) | ORAL | Status: DC
Start: 2024-03-14 — End: 2024-03-14

## 2024-03-14 MED ORDER — METOPROLOL TARTRATE 50 MG PO TABS
50.0000 mg | ORAL_TABLET | Freq: Two times a day (BID) | ORAL | Status: DC
  Administered 2024-03-14 – 2024-03-20 (×12): 50 mg via ORAL
  Filled 2024-03-14 (×13): qty 1

## 2024-03-14 MED ORDER — POTASSIUM CHLORIDE CRYS ER 20 MEQ PO TBCR
40.0000 meq | EXTENDED_RELEASE_TABLET | Freq: Two times a day (BID) | ORAL | Status: AC
  Administered 2024-03-14 (×2): 40 meq via ORAL
  Filled 2024-03-14 (×2): qty 2

## 2024-03-14 MED ORDER — INSULIN ASPART 100 UNIT/ML IJ SOLN
0.0000 [IU] | Freq: Three times a day (TID) | INTRAMUSCULAR | Status: DC
  Administered 2024-03-15: 1 [IU] via SUBCUTANEOUS
  Administered 2024-03-17: 2 [IU] via SUBCUTANEOUS
  Administered 2024-03-19: 1 [IU] via SUBCUTANEOUS

## 2024-03-14 MED ORDER — HEPARIN SODIUM (PORCINE) 5000 UNIT/ML IJ SOLN
5000.0000 [IU] | Freq: Three times a day (TID) | INTRAMUSCULAR | Status: DC
  Administered 2024-03-14 – 2024-03-20 (×17): 5000 [IU] via SUBCUTANEOUS
  Filled 2024-03-14 (×17): qty 1

## 2024-03-14 MED ORDER — HYDROCHLOROTHIAZIDE 12.5 MG PO TABS
12.5000 mg | ORAL_TABLET | Freq: Every day | ORAL | Status: DC
  Administered 2024-03-14 – 2024-03-20 (×6): 12.5 mg via ORAL
  Filled 2024-03-14 (×7): qty 1

## 2024-03-14 NOTE — Plan of Care (Signed)
  Problem: Health Behavior/Discharge Planning: Goal: Ability to manage health-related needs will improve Outcome: Progressing   Problem: Clinical Measurements: Goal: Will remain free from infection Outcome: Progressing   Problem: Activity: Goal: Risk for activity intolerance will decrease Outcome: Progressing   Problem: Elimination: Goal: Will not experience complications related to urinary retention Outcome: Progressing   Problem: Safety: Goal: Ability to remain free from injury will improve Outcome: Progressing   Problem: Skin Integrity: Goal: Risk for impaired skin integrity will decrease Outcome: Progressing

## 2024-03-14 NOTE — Procedures (Signed)
 Patient Name: Kayla Hunt  MRN: [id number]  Epilepsy Attending: Arleene Lack  Referring Physician/Provider: Colon Dear, NP  Date: 03/14/2024 Duration: 28.41 mins  Patient history: 73yo F with ams. EEG to evaluate for seizure  Level of alertness: Awake  AEDs during EEG study: None  Technical aspects: This EEG study was done with scalp electrodes positioned according to the 10-20 International system of electrode placement. Electrical activity was reviewed with band pass filter of 1-70Hz , sensitivity of 7 uV/mm, display speed of 67mm/sec with a 60Hz  notched filter applied as appropriate. EEG data were recorded continuously and digitally stored.  Video monitoring was available and reviewed as appropriate.  Description: The posterior dominant rhythm consists of 8 Hz activity of moderate voltage (25-35 uV) seen predominantly in posterior head regions, symmetric and reactive to eye opening and eye closing. Intermittent generalized 5-7hz  theta slowing was also noted.  Hyperventilation and photic stimulation were not performed.     ABNORMALITY - Intermittent slow, generalized  IMPRESSION: This study is suggestive of mild diffuse encephalopathy. No seizures or epileptiform discharges were seen throughout the recording.  Kayla Hunt

## 2024-03-14 NOTE — Progress Notes (Signed)
 Speech Language Pathology  Patient Details Name: Kayla Hunt MRN: [id number] DOB: 05/01/1951 Today's Date: 03/14/2024 Time:  -     Pt passed her swallow screen. Pt stated she was not having any difficulty swallowing - no assessment needed with ST.    Kayla Hunt  03/14/2024, 10:21 AM

## 2024-03-14 NOTE — Evaluation (Signed)
 Occupational Therapy Evaluation Patient Details Name: Kayla Hunt MRN: [id number] DOB: 1951/10/21 Today's Date: 03/14/2024   History of Present Illness   Pt is 73 yo presenting to [facility] ED on 5/14 due to confusion, slurred speech and word finding difficulty with generalized weakness and R side facial droop/weakness. MRI (-). Pt with Acute metabolic encephalopathy. PMH: HTN, HLD, Hypothyroidism, seizures, DM II, chronic diastolic HF, CKD IV.     Clinical Impressions Attempted to call daughter x 2 for PLOF however no answer. Nurse has not talked with daughter either. Pt lethargic however states she lives with her daughter in an apt and primarily uses a wc for mobility, sleeps in a recliner downstairs and uses a BSC. States she is able to complete her self care and that her daughter assists with IADL tasks. Pt currently required Max A to mobilize to EOB and +2 total A to attempt to stand to pivot to chair, however pt did not attempt to initiate stand. At this time feel Patient will benefit from continued inpatient follow up therapy, <3 hours/day to maximize functional level of independence. Acute OT will follow and modify DC recs if needed once daughter clarifies baseline.     If plan is discharge home, recommend the following:   Two people to help with walking and/or transfers;Two people to help with bathing/dressing/bathroom;Direct supervision/assist for medications management;Direct supervision/assist for financial management;Assist for transportation;Help with stairs or ramp for entrance     Functional Status Assessment   Patient has had a recent decline in their functional status and demonstrates the ability to make significant improvements in function in a reasonable and predictable amount of time.     Equipment Recommendations   [employer];Hospital bed     Recommendations for Other Services         Precautions/Restrictions   Precautions Precautions: Fall      Mobility Bed Mobility Overal bed mobility: Needs Assistance Bed Mobility: Supine to Sit, Sit to Supine     Supine to sit: Max assist Sit to supine: Total assist        Transfers                   General transfer comment: will need +2 and possible Stedy      Balance Overall balance assessment: Needs assistance   Sitting balance-Leahy Scale: Poor Sitting balance - Comments: leaning posteriorly                                   ADL either performed or assessed with clinical judgement   ADL Overall ADL's : Needs assistance/impaired Eating/Feeding: Maximal assistance   Grooming: Maximal assistance Grooming Details (indicate cue type and reason): ablet o wash face however unable to sustain arousal to brush teeth Upper Body Bathing: Maximal assistance   Lower Body Bathing: Total assistance   Upper Body Dressing : Maximal assistance   Lower Body Dressing: Total assistance               Functional mobility during ADLs: Maximal assistance (for bed mobility; will need +2 for transfers) General ADL Comments: assessed at bed level/sitting EOB     Vision   Additional Comments: will further assess; poor visual attention     Perception Perception: Impaired Preception Impairment Details: Spatial orientation     Praxis         Pertinent Vitals/Pain Pain Assessment Pain Assessment: No/denies pain  Extremity/Trunk Assessment Upper Extremity Assessment Upper Extremity Assessment: Generalized weakness (B grip strength equal; limited shoulder ROM to @ 70 B which is most likely baseline)   Lower Extremity Assessment Lower Extremity Assessment: Defer to PT evaluation (B knee positioned at rest @ 30 degrees)   Cervical / Trunk Assessment Cervical / Trunk Assessment: Kyphotic   Communication Communication Communication: Impaired Factors Affecting Communication: Reduced clarity of speech   Cognition Arousal: Lethargic Behavior During  Therapy: Anxious Cognition: No family/caregiver present to determine baseline, Cognition impaired   Orientation impairments: Time, Situation Awareness: Intellectual awareness impaired, Online awareness impaired Memory impairment (select all impairments): Short-term memory, 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[employer], Engineer, structural memory Attention impairment (select first level of impairment): Focused attention Executive functioning impairment (select all impairments): Initiation, Organization, Sequencing, Reasoning, Problem solving                   Following commands: Impaired Following commands impaired: Follows one step commands inconsistently     Cueing  General Comments   Cueing Techniques: Verbal cues;Gestural cues;Tactile cues;Visual cues      Exercises     Shoulder Instructions      Home Living Family/patient expects to be discharged to:: Private residence Living Arrangements: Children Available Help at Discharge: Family;Available 24 hours/day Type of Home: Apartment Home Access: Stairs to enter Entrance Stairs-Number of Steps: 5 Entrance Stairs-Rails: None Home Layout: Two level;Able to live on main level with bedroom/bathroom (sleeps in a recliner)               Home Equipment: Rolling Walker (2 wheels);BSC/3in1;Wheelchair - manual          Prior Functioning/Environment Prior Level of Function : Patient poor historian/Family not available;Independent/Modified Independent               ADLs Comments: Pt states she is independent however noted B knee flexion contractures  lacking @ 30 degrees extension    OT Problem List: Decreased strength;Decreased range of motion;Decreased activity tolerance;Impaired balance (sitting and/or standing);Impaired vision/perception;Decreased coordination;Decreased cognition;Decreased safety awareness;Decreased knowledge of use of DME or AE   OT Treatment/Interventions: Self-care/ADL training;Therapeutic exercise;DME and/or AE  instruction;Therapeutic activities;Cognitive remediation/compensation;Visual/perceptual remediation/compensation;Patient/family education;Balance training      OT Goals(Current goals can be found in the care plan section)   Acute Rehab OT Goals Patient Stated Goal: none stated OT Goal Formulation: Patient unable to participate in goal setting Time For Goal Achievement: 03/28/24 Potential to Achieve Goals: Fair   OT Frequency:  Min 2X/week    Co-evaluation              AM-PAC OT "6 Clicks" Daily Activity     Outcome Measure Help from another person eating meals?: A Lot Help from another person taking care of personal grooming?: A Lot Help from another person toileting, which includes using toliet, bedpan, or urinal?: Total Help from another person bathing (including washing, rinsing, drying)?: Total Help from another person to put on and taking off regular upper body clothing?: A Lot Help from another person to put on and taking off regular lower body clothing?: Total 6 Click Score: 9   End of Session Equipment Utilized During Treatment: Gait belt Nurse Communication: Mobility status  Activity Tolerance: Patient limited by lethargy Patient left: in bed;with call bell/phone within reach;with bed alarm set  OT Visit Diagnosis: Unsteadiness on feet (R26.81);Other abnormalities of gait and mobility (R26.89);Muscle weakness (generalized) (M62.81);Other symptoms and signs involving cognitive function  Time: 1191-4782 OT Time Calculation (min): 24 min Charges:  OT General Charges $OT Visit: 1 Visit OT Evaluation $OT Eval Moderate Complexity: 1 Mod OT Treatments $Self Care/Home Management : 8-22 mins  Milburn Aliment, OT/L   Acute OT Clinical Specialist Acute Rehabilitation Services Pager [phone] Office [phone]   [facility] 03/14/2024, 9:25 AM

## 2024-03-14 NOTE — Progress Notes (Signed)
 Routine EEG completed, results pending Neurology review and interpretation

## 2024-03-14 NOTE — Progress Notes (Addendum)
 2130 Due oral meds were not given since Pt is still lethargic, response to voice and pain, opens eyes but not fully, informed MD and was aware. 1610 re informed MD, PO med were not still given, pt is still lethargic, ordered to hold meds until her mental status is improved.

## 2024-03-14 NOTE — Progress Notes (Signed)
 [facility] night cross cover note:   I was notified by the patient's RN  of bladder scan result of 283 cc's.  Patient remains lethargic, and unable to convey if she has any urge to void at this time.   I subsequently ordered q4h bladder scans with prn straight cath for post-void residual greater than 400 cc's.     Camelia Cavalier, DO Hospitalist

## 2024-03-14 NOTE — Plan of Care (Signed)
  Problem: Ischemic Stroke/TIA Tissue Perfusion: Goal: Complications of ischemic stroke/TIA will be minimized 03/14/2024 0435 by Bart Born, RN Outcome: Progressing 03/14/2024 0435 by Bart Born, RN Outcome: Progressing

## 2024-03-14 NOTE — Progress Notes (Signed)
  Echocardiogram 2D Echocardiogram has been performed.  Kayla Hunt 03/14/2024, 2:19 PM

## 2024-03-14 NOTE — Progress Notes (Signed)
 NEUROLOGY CONSULT FOLLOW UP NOTE   Date of service: Mar 14, 2024 Patient Name: Kayla Hunt MRN:  [id number] DOB:  May 26, 1951  Interval Hx/subjective   Patient has been hemodynamically stable and afebrile overnight.  She continues to be disoriented to time and situation.  MRI reveals extensive white matter damage but superimposed possibly signs of PRES-see read below  Vitals   Vitals:   03/13/24 1846 03/13/24 2017 03/14/24 0100 03/14/24 0402  BP: (!) 172/126 (!) 151/81 (!) 148/80 (!) 155/88  Pulse: 81 71 63 67  Resp:  16    Temp:  98.4 F (36.9 C) (!) 97.5 F (36.4 C) 97.8 F (36.6 C)  TempSrc:  Axillary Axillary Axillary  SpO2:  95% 95% 97%     There is no height or weight on file to calculate BMI.  Physical Exam   Constitutional: Chronically ill-appearing elderly patient in no acute distress Psych: Affect appropriate to situation.  Eyes: No scleral injection.  HENT: No OP obstrucion.  Head: Normocephalic.  Cardiovascular: Normal rate and regular rhythm.  Respiratory: Effort normal, non-labored breathing.  Skin: WDI.   Neurologic Examination    NEURO:  Mental Status: Patient is alert, oriented to person and place but disoriented to time and situation, she is unable to give history of present illness Speech/Language: speech is with mild dysarthria, she is able to name 2 out of 3 objects and can repeat sentences in a dysarthric voice  Cranial Nerves:  II: PERRL.  III, IV, VI: EOMI. Eyelids elevate symmetrically.  V: Sensation is intact to light touch and symmetrical to face.  VII: Smile is symmetrical. Able to puff cheeks and raise eyebrows.  VIII: hearing intact to voice. IX, X: Voice is dysarthric WU:[id number] shrug 5/5. XII: tongue is midline without fasciculations. Motor: Able to move all 4 extremities with antigravity strength Tone: is normal and bulk is normal Sensation- Intact to light touch bilaterally.  Gait- deferred    Medications  Current  Facility-Administered Medications:    0.9 %  sodium chloride  infusion, , Intravenous, Continuous, Feliciana Horn, MD, Last Rate: 75 mL/hr at 03/13/24 2142, New Bag at 03/13/24 2142   acetaminophen  (TYLENOL ) tablet 650 mg, 650 mg, Oral, Q6H PRN **OR** acetaminophen  (TYLENOL ) suppository 650 mg, 650 mg, Rectal, Q6H PRN, Akula, Vijaya, MD   allopurinol  (ZYLOPRIM ) tablet 100 mg, 100 mg, Oral, q AM, Akula, Vijaya, MD, 100 mg at 03/14/24 0844   aspirin chewable tablet 81 mg, 81 mg, Oral, q AM, Akula, Vijaya, MD, 81 mg at 03/14/24 0844   clopidogrel (PLAVIX) tablet 75 mg, 75 mg, Oral, Daily, Akula, Vijaya, MD, 75 mg at 03/14/24 0844   hydrALAZINE  (APRESOLINE ) injection 5 mg, 5 mg, Intravenous, Q8H PRN, Akula, Vijaya, MD   levothyroxine  (SYNTHROID ) tablet 100 mcg, 100 mcg, Oral, Q0600, Akula, Vijaya, MD, 100 mcg at 03/14/24 0844   ondansetron  (ZOFRAN ) tablet 4 mg, 4 mg, Oral, Q6H PRN **OR** ondansetron  (ZOFRAN ) injection 4 mg, 4 mg, Intravenous, Q6H PRN, Akula, Vijaya, MD   potassium chloride SA (KLOR-CON M) CR tablet 40 mEq, 40 mEq, Oral, Once, Feliciana Horn, MD   sodium chloride  flush (NS) 0.9 % injection 3 mL, 3 mL, Intravenous, Once, Feliciana Horn, MD  Labs and Diagnostic Imaging   CBC:  Recent Labs  Lab 03/13/24 1132 03/13/24 1134 03/13/24 1420 03/14/24 0649  WBC 4.5  --   --  3.4*  NEUTROABS 2.1  --   --  1.0*  HGB 13.4   < > 11.9* 13.3  HCT 43.1   < > 35.0* 42.1  MCV 91.9  --   --  88.3  PLT 172  --   --  192   < > = values in this interval not displayed.    Basic Metabolic Panel:  Lab Results  Component Value Date   NA 140 03/14/2024   K 3.3 (L) 03/14/2024   CO2 27 03/14/2024   GLUCOSE 63 (L) 03/14/2024   BUN 22 03/14/2024   CREATININE 1.80 (H) 03/14/2024   CALCIUM  8.9 03/14/2024   GFRNONAA 30 (L) 03/14/2024   GFRAA 32 (L) 02/24/2021   Lipid Panel:  Lab Results  Component Value Date   LDLCALC 438 (H) 03/14/2024   HgbA1c:  Lab Results  Component Value Date   HGBA1C  6.3 (H) 03/14/2024   Alcohol Level     Component Value Date/Time   [facility] <15 03/13/2024 1132   INR  Lab Results  Component Value Date   INR 1.0 03/13/2024   APTT  Lab Results  Component Value Date   APTT 34 03/13/2024   TSH 96.872  CT Head without contrast(Personally reviewed): Questionable hyperdense left MCA, chronic small vessel ischemic changes  CT angio Head and Neck with contrast(Personally reviewed): No emergent LVO, age-indeterminate left vertebral artery occlusion  MRI Brain(Personally reviewed): Chronic white matter changes, additional areas of signal abnormality in bilateral basal ganglia and cerebellar hemispheres with possible edema, possibly indicating PRES  rEEG:  Pending  Assessment   Kayla Hunt is a 73 y.o. 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  MRI reveals changes associated with PRES, although she has not been very hypertensive.  On exam today, she is disoriented to time and situation but is able to name most objects and can repeat phrases in a dysarthric voice.  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  TSH is noted to be quite elevated, and hypothyroidism could certainly be contributing to her altered mental status.   Impression: Posterior reversible encephalopathy syndrome Metabolic encephalopathy in the setting of severe hypothyroidism  Recommendations  -Gradually lower blood pressure over the next 48 hours to normotension - Routine EEG-to ensure seizures are ruled out. - Treatment of hypothyroidism per primary team ______________________________________________________________________   Signed, Cortney Alvin Axe, NP [facility]    Attending Neurohospitalist Addendum Patient seen and examined with APP/Resident. Agree with the history and physical as documented above. Agree with the plan as documented, which I helped formulate. I have independently reviewed the chart, obtained history, review of systems and examined the patient.I have personally reviewed pertinent head/neck/spine imaging (CT/MRI). Please feel free to call with any questions.  -- Tona Francis, MD Neurologist [facility] Pager: [phone]

## 2024-03-14 NOTE — Evaluation (Signed)
 Physical Therapy Evaluation Patient Details Name: Kayla Hunt MRN: [id number] DOB: September 11, 1951 Today's Date: 03/14/2024  History of Present Illness  Pt is 73 yo presenting to [facility] ED on 5/14 due to confusion, slurred speech and word finding difficulty with generalized weakness and R side facial droop/weakness. MRI (-). Pt with Acute metabolic encephalopathy. PMH: HTN, HLD, Hypothyroidism, seizures, DM II, chronic diastolic HF, CKD IV.  Clinical Impression  Pt is most likely presenting below baseline level of functioning since she came from home. Currently pt is Total A for sit to stand and unable to progress gait. Pt would require a lift/hospital bed to return home with family. Due to pt current functional status, home set up and available assistance at home recommending skilled physical therapy services < 3 hours/day in order to address strength, balance and functional mobility to decrease risk for falls, injury, immobility, skin break down and re-hospitalization.        If plan is discharge home, recommend the following: Assistance with cooking/housework;Help with stairs or ramp for entrance;Two people to help with walking and/or transfers;Supervision due to cognitive status   Can travel by private vehicle   No    Equipment Recommendations Wheelchair (measurements PT);Wheelchair cushion (measurements PT);Hoyer lift;Hospital bed     Functional Status Assessment Patient has had a recent decline in their functional status and demonstrates the ability to make significant improvements in function in a reasonable and predictable amount of time.     Precautions / Restrictions Precautions Precautions: Fall Recall of Precautions/Restrictions: Impaired Restrictions Weight Bearing Restrictions Per Provider Order: No      Mobility  Bed Mobility Overal bed mobility: Needs Assistance Bed Mobility: Supine to Sit, Sit to Supine     Supine to sit: Supervision, HOB elevated, Used rails Sit to  supine: Total assist   General bed mobility comments: Pt was supervision to get to EOB with HOB elevated and use of rails. Pt kept scooting closer and closer to EOB requiring Total A to get back to supine.    Transfers Overall transfer level: Needs assistance Equipment used: Rolling walker (2 wheels), None Transfers: Sit to/from Stand Sit to Stand: Max assist           General transfer comment: Pt attempted to stand EOB 2x at Max A unable to fully clear buttocks from EOB with RW. Attempted front to front stand with gait belt and pt able to get ~50% of the way off the bed. Due to bil knee flexion contractures unable to get fully into standing.    Ambulation/Gait               General Gait Details: unable    Balance Overall balance assessment: Needs assistance Sitting-balance support: Bilateral upper extremity supported, Feet supported Sitting balance-Leahy Scale: Fair Sitting balance - Comments: No LOB sitting EOB         Pertinent Vitals/Pain Pain Assessment Pain Assessment: No/denies pain    Home Living Family/patient expects to be discharged to:: Private residence Living Arrangements: Children Available Help at Discharge: Family;Available 24 hours/day Type of Home: Apartment Home Access: Stairs to enter Entrance Stairs-Rails: None Entrance Stairs-Number of Steps: 5   Home Layout: Two level;Able to live on main level with bedroom/bathroom (sleeps in a recliner) Home Equipment: Rolling Walker (2 wheels);BSC/3in1;Wheelchair - manual Additional Comments: information from OT note. Unable to get in contact with daughter for home set up information. Pt is a poor historian    Prior Function Prior Level of Function : Patient  poor historian/Family not available;Independent/Modified Independent               ADLs Comments: Pt states she is independent however noted B knee flexion contractures  lacking @ 30 degrees extension     Extremity/Trunk Assessment    Upper Extremity Assessment Upper Extremity Assessment: Defer to OT evaluation    Lower Extremity Assessment Lower Extremity Assessment: Generalized weakness;RLE deficits/detail;LLE deficits/detail RLE Deficits / Details: pt unable to get to full knee ext bil LLE Deficits / Details: pt unable to get to full knee ext bil    Cervical / Trunk Assessment Cervical / Trunk Assessment: Kyphotic  Communication   Communication Communication: Impaired Factors Affecting Communication: Reduced clarity of speech    Cognition Arousal: Alert Behavior During Therapy: Restless, Impulsive   PT - Cognitive impairments: No family/caregiver present to determine baseline, Orientation, Sequencing, Problem solving, Safety/Judgement, Memory   Orientation impairments: Time, Situation, Place     Following commands: Impaired Following commands impaired: Follows one step commands inconsistently     Cueing Cueing Techniques: Verbal cues, Gestural cues, Tactile cues, Visual cues     General Comments General comments (skin integrity, edema, etc.): Pt has a small bruise at the sternum.        Assessment/Plan    PT Assessment Patient needs continued PT services  PT Problem List Decreased strength;Decreased activity tolerance;Decreased range of motion;Decreased mobility;Decreased balance;Decreased safety awareness       PT Treatment Interventions DME instruction;Balance training;Gait training;Neuromuscular re-education;Functional mobility training;Therapeutic activities;Therapeutic exercise;Patient/family education;Wheelchair mobility training    PT Goals (Current goals can be found in the Care Plan section)  Acute Rehab PT Goals PT Goal Formulation: Patient unable to participate in goal setting Time For Goal Achievement: 03/28/24 Potential to Achieve Goals: Fair    Frequency Min 2X/week        AM-PAC PT "6 Clicks" Mobility  Outcome Measure Help needed turning from your back to your side  while in a flat bed without using bedrails?: A Little Help needed moving from lying on your back to sitting on the side of a flat bed without using bedrails?: A Little Help needed moving to and from a bed to a chair (including a wheelchair)?: Total Help needed standing up from a chair using your arms (e.g., wheelchair or bedside chair)?: Total Help needed to walk in hospital room?: Total Help needed climbing 3-5 steps with a railing? : Total 6 Click Score: 10    End of Session Equipment Utilized During Treatment: Gait belt Activity Tolerance: Patient tolerated treatment well Patient left: in bed;with call bell/phone within reach;with bed alarm set Nurse Communication: Mobility status;Need for lift equipment PT Visit Diagnosis: Unsteadiness on feet (R26.81);Other abnormalities of gait and mobility (R26.89);Muscle weakness (generalized) (M62.81)    Time: 9147-8295 PT Time Calculation (min) (ACUTE ONLY): 15 min   Charges:   PT Evaluation $PT Eval Low Complexity: 1 Low   PT General Charges $$ ACUTE PT VISIT: 1 Visit       Kayla Hunt, DPT, CLT  Acute Rehabilitation Services Office: [phone] (Secure chat preferred)   Kayla Hunt 03/14/2024, 3:25 PM

## 2024-03-14 NOTE — Progress Notes (Signed)
 [facility]                                                                               [name], is a 73 y.o. female, DOB - [birth year], NWG:[id number] Admit date - 03/13/2024    Outpatient Primary MD for the patient is Marius Siemens, NP  LOS - 0  days    Brief summary   Kayla Hunt is a 73 y.o. female with medical history significant of hypertension, hyperlipidemia. Hypothyroidism, seizures, type 2 DM , chronic diastolic CHF, stage 4 CKD, was brought in from home as a code stroke. As per the daughter, she has been confused, with slurred speech and word finding difficulty and has generalized weakness, with right sided facial droop and right sided weakness.  MRI brain is negative for acute stroke. Superimposed on the background of chronic white matter changes there are additional areas of signal abnormality within the right temporal and right occipital lobes as well as the bilateral basal ganglia and bilateral cerebellar hemispheres with possible edema. Findings could reflect PRES in the appropriate clinical context. Neurology on board.    Assessment & Plan    Assessment and Plan:  Acute metabolic encephalopathy probably from PRES.  Differential include TIA Ammonia level is wnl. TSH is elevated.  Echo pending. No signs of infection. UA is negative. CXR No active disease.  LDL is 438. Added atorvastatin 80 mg daily.  MRI reviewed and discussed with the daughter over the phone.  SLP/PT/OT Eval.     Hypokalemia Replaced.  Repeat in am.    Hypothyroidism;  TSH around 95. Suspect from non compliance.  Free t4 and free t3 are pending.    Hyperlipidemia:  LDL is 438. Continue with lipitor 80 mg daily.    Stage 4 CKD Creatinine at 1.8.    H/o Seizures:  Neurology on board.  EEG done and showed  mild diffuse encephalopathy. No seizures or epileptiform discharges were seen throughout the recording.    Type 2 Diabetes Mellitus:  CBG (last  3)  Recent Labs    03/13/24 1127 03/14/24 1244  GLUCAP 127* 63*   A1c is 6.3.    Chronic diastolic heart failure:  Echo pending.      Estimated body mass index is 46.38 kg/m as calculated from the following:   Height as of 11/20/19: 5\' 4"  (1.626 m).   Weight as of 07/15/19: 122.6 kg.  Code Status: full code.  DVT Prophylaxis:  Heparin    Level of Care: Level of care: Telemetry Medical Family Communication: Updated patient's care giver/ daughter over the phone.   Disposition Plan:     Remains inpatient appropriate:  pending further work up.   Procedures:  EEG.  Echo.   Consultants:   Neurology.   Antimicrobials:   Anti-infectives (From admission, onward)    None        Medications  Scheduled Meds:  allopurinol   100 mg Oral q AM   aspirin  81 mg Oral q AM   atorvastatin  80 mg Oral Daily   clopidogrel  75 mg Oral Daily   hydrALAZINE   50 mg Oral Q8H   losartan   50  mg Oral Daily   And   hydrochlorothiazide   12.5 mg Oral Daily   insulin  aspart  0-6 Units Subcutaneous TID WC   levothyroxine   100 mcg Oral Q0600   metoprolol  tartrate  50 mg Oral BID   potassium chloride  40 mEq Oral Once   potassium chloride  40 mEq Oral BID   sodium chloride  flush  3 mL Intravenous Once   Continuous Infusions:  sodium chloride  75 mL/hr at 03/13/24 2142   PRN Meds:.acetaminophen  **OR** acetaminophen , hydrALAZINE , ondansetron  **OR** ondansetron  (ZOFRAN ) IV    Subjective:   Kayla Hunt was seen and examined today.  Confused.   Objective:   Vitals:   03/13/24 2017 03/14/24 0100 03/14/24 0402 03/14/24 1246  BP: (!) 151/81 (!) 148/80 (!) 155/88 136/87  Pulse: 71 63 67 80  Resp: 16   18  Temp: 98.4 F (36.9 C) (!) 97.5 F (36.4 C) 97.8 F (36.6 C) (!) 97.5 F (36.4 C)  TempSrc: Axillary Axillary Axillary Oral  SpO2: 95% 95% 97% 98%    Intake/Output Summary (Last 24 hours) at 03/14/2024 1349 Last data filed at 03/14/2024 1610 Gross per 24 hour  Intake --   Output 200 ml  Net -200 ml   There were no vitals filed for this visit.   Exam General exam: ill appearing elderly woman, confused. Not in distress.  Respiratory system: Clear to auscultation. Respiratory effort normal. Cardiovascular system: S1 & S2 heard, RRR. No JVD,. Gastrointestinal system: Abdomen is nondistended, soft and nontender.  Central nervous system: Lethargic and confused.  Extremities: no pedal edema.  Skin: No rashes, Psychiatry: confused.    Data Reviewed:  I have personally reviewed following labs and imaging studies   CBC Lab Results  Component Value Date   WBC 3.4 (L) 03/14/2024   RBC 4.77 03/14/2024   HGB 13.3 03/14/2024   HCT 42.1 03/14/2024   MCV 88.3 03/14/2024   MCH 27.9 03/14/2024   PLT 192 03/14/2024   MCHC 31.6 03/14/2024   RDW 15.6 (H) 03/14/2024   LYMPHSABS 2.0 03/14/2024   MONOABS 0.3 03/14/2024   EOSABS 0.1 03/14/2024   BASOSABS 0.0 03/14/2024     Last metabolic panel Lab Results  Component Value Date   NA 140 03/14/2024   K 3.3 (L) 03/14/2024   CL 104 03/14/2024   CO2 27 03/14/2024   BUN 22 03/14/2024   CREATININE 1.80 (H) 03/14/2024   GLUCOSE 63 (L) 03/14/2024   GFRNONAA 30 (L) 03/14/2024   GFRAA 32 (L) 02/24/2021   CALCIUM  8.9 03/14/2024   PHOS 3.2 05/11/2023   PROT 6.5 03/13/2024   ALBUMIN 2.5 (L) 03/13/2024   LABGLOB 3.2 07/15/2019   AGRATIO 1.1 (L) 07/15/2019   BILITOT 0.5 03/13/2024   ALKPHOS 58 03/13/2024   AST 22 03/13/2024   ALT 11 03/13/2024   ANIONGAP 9 03/14/2024    CBG (last 3)  Recent Labs    03/13/24 1127 03/14/24 1244  GLUCAP 127* 63*      Coagulation Profile: Recent Labs  Lab 03/13/24 1132  INR 1.0     Radiology Studies: EEG adult Result Date: 03/14/2024 Arleene Lack, MD     03/14/2024 12:13 PM Patient Name: Kayla Hunt MRN: [id number] Epilepsy Attending: Arleene Lack Referring Physician/Provider: Colon Dear, NP Date: 03/14/2024 Duration: 28.41 mins Patient  history: 73yo F with ams. EEG to evaluate for seizure Level of alertness: Awake AEDs during EEG study: None Technical aspects: This EEG study was done with  scalp electrodes positioned according to the 10-20 International system of electrode placement. Electrical activity was reviewed with band pass filter of 1-70Hz , sensitivity of 7 uV/mm, display speed of 84mm/sec with a 60Hz  notched filter applied as appropriate. EEG data were recorded continuously and digitally stored.  Video monitoring was available and reviewed as appropriate. Description: The posterior dominant rhythm consists of 8 Hz activity of moderate voltage (25-35 uV) seen predominantly in posterior head regions, symmetric and reactive to eye opening and eye closing. Intermittent generalized 5-7hz  theta slowing was also noted. Hyperventilation and photic stimulation were not performed.   ABNORMALITY - Intermittent slow, generalized IMPRESSION: This study is suggestive of mild diffuse encephalopathy. No seizures or epileptiform discharges were seen throughout the recording. Arleene Lack   MR BRAIN WO CONTRAST Result Date: 03/13/2024 CLINICAL DATA:  Neuro deficit, concern for stroke, right-sided deficits. EXAM: MRI HEAD WITHOUT CONTRAST TECHNIQUE: Multiplanar, multiecho pulse sequences of the brain and surrounding structures were obtained without intravenous contrast. COMPARISON:  Same day CT head and CTA head and neck. FINDINGS: Brain: No acute infarct. No evidence of intracranial hemorrhage. Scattered and confluent T2/FLAIR signal abnormality within the periventricular and subcortical white matter which is significantly increased since 2012. Additional signal abnormality in the posterior right temporal and right occipital lobes with suggestion of mild gyral expansion. There is additional T2/FLAIR signal abnormality in the bilateral cerebellar hemispheres which is significantly increased from prior. Additional signal abnormality involving the basal  ganglia and pons. No midline shift. Small focus of encephalomalacia in the lateral right temporal lobe which may reflect remote trauma versus infarct. The basilar cisterns are patent. No extra-axial fluid collections. Ventricles: Normal size and configuration of the ventricles. Vascular: Skull base flow voids are visualized. Skull and upper cervical spine: No focal abnormality. Sinuses/Orbits: Orbits are symmetric. Paranasal sinuses are clear. Other: Mastoid air cells are clear. IMPRESSION: Multiple sequences are degraded by motion artifact. No acute infarct. Significantly increased white matter signal abnormality compared to 2012 likely reflecting chronic microvascular ischemic changes. Superimposed on the background of chronic white matter changes there are additional areas of signal abnormality within the right temporal and right occipital lobes as well as the bilateral basal ganglia and bilateral cerebellar hemispheres with possible edema. Findings could reflect PRES in the appropriate clinical context. Electronically Signed   By: Denny Flack M.D.   On: 03/13/2024 18:35   DG Chest 1 View Result Date: 03/13/2024 CLINICAL DATA:  Stroke.  History of CHF. EXAM: CHEST  1 VIEW COMPARISON:  Chest radiograph dated 05/05/2011. FINDINGS: No focal consolidation, pleural effusion, pneumothorax. Borderline cardiomegaly. No acute osseous pathology. IMPRESSION: 1. No active disease. 2. Borderline cardiomegaly. Electronically Signed   By: Angus Bark M.D.   On: 03/13/2024 14:24   CT ANGIO HEAD NECK W WO CM W PERF (CODE STROKE) Result Date: 03/13/2024 CLINICAL DATA:  73 year old female code stroke presentation. Dysarthria, weakness. Questionable hyperdense left MCA on plain head CT. EXAM: CT ANGIOGRAPHY HEAD AND NECK CT PERFUSION BRAIN TECHNIQUE: Multidetector CT imaging of the head and neck was performed using the standard protocol during bolus administration of intravenous contrast. Multiplanar CT image  reconstructions and MIPs were obtained to evaluate the vascular anatomy. Carotid stenosis measurements (when applicable) are obtained utilizing NASCET criteria, using the distal internal carotid diameter as the denominator. Multiphase CT imaging of the brain was performed following IV bolus contrast injection. Subsequent parametric perfusion maps were calculated using RAPID software. RADIATION DOSE REDUCTION: This exam was performed according to the departmental  dose-optimization program which includes automated exposure control, adjustment of the mA and/or kV according to patient size and/or use of iterative reconstruction technique. CONTRAST:  OMNIPAQUE IOHEXOL 350 MG/ML SOLN COMPARISON:  Plain head CT 1135 hours today. FINDINGS: CT Brain Perfusion Findings: ASPECTS: 10 (chronic encephalomalacia in the right hemisphere). CTP is motion degraded. CBF (<30%) Volume: 0mL, with no CBF for CBV parameter abnormality detected. Perfusion (Tmax>6.0s) volume: 11mL, although right temporal lobe, inferior frontal gyrus Mismatch Volume: Up to 11mL Infarction Location:No core infarct detected. CTA NECK Skeleton: Carious and absent dentition. No acute osseous abnormality identified. Upper chest: Negative. Other neck: Nonvascular neck soft tissue spaces appear within normal limits. Aortic arch: Tortuous aortic arch with soft and calcified plaque. Three vessel arch configuration. Right carotid system: Brachiocephalic artery tortuosity, soft and calcified plaque without stenosis. Tortuous proximal right CCA with mild calcified plaque, no stenosis. Mild distal right CCA and mild right carotid bifurcation calcified plaque without stenosis. Mild right carotid bulb calcified plaque. No significant right ICA stenosis to the skull base. Left carotid system: Similar tortuosity and mild atherosclerosis without hemodynamically significant stenosis. Vertebral arteries: Tortuous brachiocephalic artery with soft plaque, no stenosis.  Calcified plaque at the right vertebral artery origin on series 5, image 137 with moderate stenosis. The right vertebral remains patent to the skull base and is tortuous in the neck with mild additional calcified plaque, no other significant stenosis. Proximal left subclavian artery is tortuous with soft plaque. The left vertebral artery origin is patent but appears stenotic, with highly diminutive and poorly enhancing left V1 vertebral artery (series 7, image 253). Largely occluded left V2 segment. Reconstitution of the left V3 segment from a muscular branch in the upper neck. Distal left V3 segment appears irregular and stenotic at the skull base. CTA HEAD Posterior circulation: Left V4 segment appears functionally occluded, minimally enhancing. Right vertebral V4 is patent and supplies the basilar without stenosis. Patent basilar artery. Patent SCA and PCA origins. Small left posterior communicating artery. Left PCA branches are patent with mild irregularity, mild to moderate left P3 stenosis series 13, image 26. Right PCA branches are patent with mild irregularity. Anterior circulation: Both ICA siphons are patent. Left siphon calcified plaque is mild to moderate, with only mild left siphon stenosis. Normal left posterior communicating artery origin. Right siphon is tortuous with mild to moderate calcified plaque and mild to moderate stenosis at the anterior genu series 10, image 100. Patent carotid termini. Patent MCA and ACA origins. Tortuous A1 segments. Right A1 appears mildly dominant. Normal anterior communicating artery. Bilateral ACA branches are patent. There is mild right A2 irregularity and stenosis on series 13, image 23. Right MCA M1 segment is tortuous. Right MCA bifurcation is patent without stenosis. Right MCA branches appear patent. Left MCA M1 segment is tortuous. Left MCA bifurcation is patent. Left MCA branches appear patent. No branch stenosis or occlusion identified. Venous sinuses: Early  contrast timing, not evaluated. Anatomic variants: Affectively dominant right vertebral artery. Mildly dominant right ACA A1. Review of the MIP images confirms the above findings Preliminary report of the CTA discussed by telephone with Dr. ASHISH ARORA on 03/13/2024 at 1201 hours. IMPRESSION: 1. Motion degraded CTP. No infarct core detected, questionable 11 mL oligemia primarily in the right temporal lobe. 2. 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 Only the V3 segment demonstrates relatively normal enhancement, due to reconstitution from a muscular branch there. 3. Right Vertebral Artery supplies  the basilar, with Moderate origin stenosis due to calcified plaque. 4. No extracranial carotid stenosis. Mild to moderate ICA siphon plaque with mild to moderate right ICA anterior genu stenosis. No other hemodynamically significant anterior or posterior circulation stenosis. 5.  Aortic Atherosclerosis (ICD10-I70.0). Electronically Signed   By: Marlise Simpers M.D.   On: 03/13/2024 12:20   CT HEAD CODE STROKE WO CONTRAST Result Date: 03/13/2024 CLINICAL DATA:  Code stroke. 73 year old female with right side deficit. EXAM: CT HEAD WITHOUT CONTRAST TECHNIQUE: Contiguous axial images were obtained from the base of the skull through the vertex without intravenous contrast. RADIATION DOSE REDUCTION: This exam was performed according to the departmental dose-optimization program which includes automated exposure control, adjustment of the mA and/or kV according to patient size and/or use of iterative reconstruction technique. COMPARISON:  Brain MRI 05/06/2011.  Head CT 05/05/2011. FINDINGS: Brain: Cerebral volume not significantly changed since 2012. No midline shift, ventriculomegaly, mass effect, evidence of mass lesion, intracranial hemorrhage or evidence of cortically based acute infarction. Patchy bilateral cerebral white matter hypodensity which is  fairly symmetric. However, there is chronic cortical encephalomalacia in the posterior and lateral right temporal lobe, right occipital pole stable since 2012. See series 3, images 15 through 20. Vascular: Questionable hyperdense left MCA branch in the sylvian fissure series 6, image 41. Skull: Intact.  No acute osseous abnormality identified. Sinuses/Orbits: Progressed chronic right maxillary sinus disease with mucoperiosteal thickening. Chronic right middle ear and mastoid opacification is stable. Other: No significant gaze deviation. Visualized orbits and scalp soft tissues are within normal limits. ASPECTS [facility] Stroke Program Early CT Score) - Ganglionic level infarction (caudate, lentiform nuclei, internal capsule, insula, M1-M3 cortex): - Supraganglionic infarction (M4-M6 cortex): Total score (0-10 with 10 being normal): IMPRESSION: 1. Questionable Hyperdense Left MCA branch in the Sylvian fissure. But no cortically based acute infarct or acute intracranial hemorrhage identified. ASPECTS 10. 2. 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 Other chronic white matter disease. These results were communicated to Dr. Arora at 11:42 am on 03/13/2024 by text page via the [facility] messaging system. Electronically Signed   By: Marlise Simpers M.D.   On: 03/13/2024 11:43       Feliciana Horn M.D. [facility] 03/14/2024, 1:49 PM  Available via Epic secure chat 7am-7pm After 7 pm, please refer to night coverage provider listed on amion.

## 2024-03-14 NOTE — Progress Notes (Signed)
 Initial Nutrition Assessment  DOCUMENTATION CODES:  Not applicable  INTERVENTION:  Continue current diet as ordered Encourage good PO intake MVI with minerals daily RN to obtain height and weight  NUTRITION DIAGNOSIS:  Inadequate oral intake related to lethargy/confusion as evidenced by per patient/family report.  GOAL:  Patient will meet greater than or equal to 90% of their needs  MONITOR:  PO intake  REASON FOR ASSESSMENT:  Consult Assessment of nutrition requirement/status  ASSESSMENT:  Pt with hx of CHF, DM type 2, HTN. HLD, gout, CKD 4, and seizures presented to ED with right sided weakness and facial droop.  Attempted to see pt and ECHO team currently setting up to perform procedure. Will revisit to obtain nutrition hx and physical exam.   No height or weight recorded. Reached out to RN to obtain so that nutrition needs could be established. Will utilize IBW at this time.   Noted that no acute CVAs seen on imaging at this time but concerning for PRES. Neurology following.   Admit / Current weight: unknown, requested weight be obtained from RN   Nutritionally Relevant Medications: Scheduled Meds:  potassium chloride  40 mEq Oral Once   Continuous Infusions:  sodium chloride  75 mL/hr at 03/13/24 2142   PRN Meds: ondansetron   Labs Reviewed: K 3.3 Creatinine 1.8 CBG ranges from 63-127 mg/dL over the last 24 hours HgbA1c 6.3%  NUTRITION - FOCUSED PHYSICAL EXAM: Defer to in-person assessment  Diet Order:   Diet Order             Diet regular Room service appropriate? No; Fluid consistency: Thin  Diet effective now                   EDUCATION NEEDS:  No education needs have been identified at this time  Skin:  Skin Assessment: Reviewed RN Assessment  Last BM:  unsure  Height:  Ht Readings from Last 1 Encounters:  11/20/19 5\' 4"  (1.626 m)    Weight:  Wt Readings from Last 1 Encounters:  07/15/19 122.6 kg    Ideal Body Weight:  54.5  kg  BMI:  There is no height or weight on file to calculate BMI.  Estimated Nutritional Needs:  Kcal:  1500-1700 kcal/d Protein:  70-90g/d Fluid:  >1.5L/d    Edwena Graham, RD, LDN Registered Dietitian II Please reach out via secure chat

## 2024-03-15 DIAGNOSIS — I10 Essential (primary) hypertension: Secondary | ICD-10-CM | POA: Diagnosis not present

## 2024-03-15 DIAGNOSIS — E785 Hyperlipidemia, unspecified: Secondary | ICD-10-CM | POA: Diagnosis not present

## 2024-03-15 DIAGNOSIS — R2990 Unspecified symptoms and signs involving the nervous system: Secondary | ICD-10-CM | POA: Diagnosis not present

## 2024-03-15 DIAGNOSIS — E039 Hypothyroidism, unspecified: Secondary | ICD-10-CM | POA: Diagnosis not present

## 2024-03-15 DIAGNOSIS — I6783 Posterior reversible encephalopathy syndrome: Secondary | ICD-10-CM | POA: Diagnosis not present

## 2024-03-15 DIAGNOSIS — G459 Transient cerebral ischemic attack, unspecified: Secondary | ICD-10-CM | POA: Diagnosis not present

## 2024-03-15 DIAGNOSIS — G9341 Metabolic encephalopathy: Secondary | ICD-10-CM | POA: Diagnosis not present

## 2024-03-15 LAB — BASIC METABOLIC PANEL WITH GFR
Anion gap: 6 (ref 5–15)
BUN: 22 mg/dL (ref 8–23)
CO2: 26 mmol/L (ref 22–32)
Calcium: 8.5 mg/dL — ABNORMAL LOW (ref 8.9–10.3)
Chloride: 106 mmol/L (ref 98–111)
Creatinine, Ser: 1.81 mg/dL — ABNORMAL HIGH (ref 0.44–1.00)
GFR, Estimated: 30 mL/min — ABNORMAL LOW (ref >60)
Glucose, Bld: 104 mg/dL — ABNORMAL HIGH (ref 70–99)
Potassium: 3.8 mmol/L (ref 3.5–5.1)
Sodium: 138 mmol/L (ref 135–145)

## 2024-03-15 LAB — RPR: RPR Ser Ql: NONREACTIVE

## 2024-03-15 LAB — GLUCOSE, CAPILLARY
Glucose-Capillary: 96 mg/dL (ref 70–99)
Glucose-Capillary: 120 mg/dL — ABNORMAL HIGH (ref 70–99)
Glucose-Capillary: 154 mg/dL — ABNORMAL HIGH (ref 70–99)
Glucose-Capillary: 92 mg/dL (ref 70–99)

## 2024-03-15 LAB — T3, FREE: T3, Free: <0.3 pg/mL — ABNORMAL LOW (ref 2.0–4.4)

## 2024-03-15 NOTE — Progress Notes (Signed)
 [facility]                                                                               Kayla Hunt, is a 73 y.o. female, DOB - September 21, 1951, WUJ:[id number] Admit date - 03/13/2024    Outpatient Primary MD for the patient is Marius Siemens, NP  LOS - 1  days    Brief summary   Kayla Hunt is a 73 y.o. female with medical history significant of hypertension, hyperlipidemia. Hypothyroidism, seizures, type 2 DM , chronic diastolic CHF, stage 4 CKD, was brought in from home as a code stroke. As per the daughter, she has been confused, with slurred speech and word finding difficulty and has generalized weakness, with right sided facial droop and right sided weakness.  MRI brain is negative for acute stroke. Superimposed on the background of chronic white matter changes there are additional areas of signal abnormality within the right temporal and right occipital lobes as well as the bilateral basal ganglia and bilateral cerebellar hemispheres with possible edema. Findings could reflect PRES in the appropriate clinical context. Neurology on board.    Assessment & Plan    Assessment and Plan:  Acute metabolic encephalopathy probably from PRES and severe hypothyroidism. Ammonia level is wnl. TSH is elevated.  Echocardiogram reviewed. Showed LVEF of 60 to 65%. Grade 1 diastolic dysfunction.   She was found to have a rounded density on the RCC best seen in the PLAX view , rule out mass.  Cardiology consulted for TEE. Kayla Hunt No signs of infection. UA is negative. CXR No active disease.  LDL is 438. Added atorvastatin 80 mg daily.  MRI reviewed and discussed with the daughter over the phone.  SLP/PT/OT Eval.  TEE scheduled for Monday.     Hypokalemia Replaced.  Repeat in am.    Hypothyroidism;  TSH around 95. Suspect from non compliance.  Free t4 and free t3 are  severely low.  Discussed with her daughter, who confirmed that she might not be taking her meds.     Hyperlipidemia:  LDL is 438. Continue with lipitor 80 mg daily.    Stage 4 CKD Creatinine stable at 1.8.    H/o Seizures:  Neurology on board.  EEG done and showed  mild diffuse encephalopathy. No seizures or epileptiform discharges were seen throughout the recording.    Type 2 Diabetes Mellitus:  CBG (last 3)  Recent Labs    03/14/24 1606 03/14/24 2110 03/15/24 0628  GLUCAP 105* 133* 96   A1c is 6.3.    Chronic diastolic heart failure:  Echo reviewed.      Estimated body mass index is 27.7 kg/m as calculated from the following:   Height as of this encounter: 5\' 1"  (1.549 m).   Weight as of this encounter: 66.5 kg.  Code Status: full code.  DVT Prophylaxis:  heparin  injection 5,000 Units Start: 03/14/24 1445Heparin   Level of Care: Level of care: Telemetry Medical Family Communication: Updated patient's care giver/ daughter over the phone.   Disposition Plan:     Remains inpatient appropriate:  pending further work up.   Procedures:  EEG.  Echo.   Consultants:  Neurology.   Antimicrobials:   Anti-infectives (From admission, onward)    None        Medications  Scheduled Meds:  allopurinol   100 mg Oral q AM   aspirin  81 mg Oral q AM   atorvastatin  80 mg Oral Daily   clopidogrel  75 mg Oral Daily   heparin  injection (subcutaneous)  5,000 Units Subcutaneous Q8H   hydrALAZINE   50 mg Oral Q8H   losartan   50 mg Oral Daily   And   hydrochlorothiazide   12.5 mg Oral Daily   insulin  aspart  0-6 Units Subcutaneous TID WC   levothyroxine   100 mcg Oral Q0600   metoprolol  tartrate  50 mg Oral BID   potassium chloride  40 mEq Oral Once   sodium chloride  flush  3 mL Intravenous Once   Continuous Infusions:  sodium chloride  75 mL/hr at 03/13/24 2142   PRN Meds:.acetaminophen  **OR** acetaminophen , hydrALAZINE , ondansetron  **OR** ondansetron  (ZOFRAN ) IV    Subjective:   Kayla Hunt was seen and examined today.  She remains confused but  more alert and answering yes and no to simple questions.   Objective:   Vitals:   03/14/24 2332 03/15/24 0421 03/15/24 0809 03/15/24 0900  BP: 138/65 116/63 (!) 140/69   Pulse: 63 64 63   Resp: 18 18 18    Temp: 97.7 F (36.5 C) 98.5 F (36.9 C) 97.9 F (36.6 C)   TempSrc: Oral Oral Oral   SpO2: 100% 94% 95%   Weight:    66.5 kg  Height:    5\' 1"  (1.549 m)    Intake/Output Summary (Last 24 hours) at 03/15/2024 1119 Last data filed at 03/15/2024 0645 Gross per 24 hour  Intake 926.46 ml  Output 300 ml  Net 626.46 ml   Filed Weights   03/15/24 0900  Weight: 66.5 kg     Exam General exam: disheveled lady, confused and not in distress.  Respiratory system: Clear to auscultation. Respiratory effort normal. Cardiovascular system: S1 & S2 heard, RRR. No JVD, murmurs Gastrointestinal system: Abdomen is nondistended, soft and nontender.  Central nervous system: Alert and oriented\ to person.  Extremities: Symmetric 5 x 5 power. Skin: No rashes,  Psychiatry: confused.    Data Reviewed:  I have personally reviewed following labs and imaging studies   CBC Lab Results  Component Value Date   WBC 3.4 (L) 03/14/2024   RBC 4.77 03/14/2024   HGB 13.3 03/14/2024   HCT 42.1 03/14/2024   MCV 88.3 03/14/2024   MCH 27.9 03/14/2024   PLT 192 03/14/2024   MCHC 31.6 03/14/2024   RDW 15.6 (H) 03/14/2024   LYMPHSABS 2.0 03/14/2024   MONOABS 0.3 03/14/2024   EOSABS 0.1 03/14/2024   BASOSABS 0.0 03/14/2024     Last metabolic panel Lab Results  Component Value Date   NA 138 03/15/2024   K 3.8 03/15/2024   CL 106 03/15/2024   CO2 26 03/15/2024   BUN 22 03/15/2024   CREATININE 1.81 (H) 03/15/2024   GLUCOSE 104 (H) 03/15/2024   GFRNONAA 30 (L) 03/15/2024   GFRAA 32 (L) 02/24/2021   CALCIUM  8.5 (L) 03/15/2024   PHOS 3.2 05/11/2023   PROT 6.5 03/13/2024   ALBUMIN 2.5 (L) 03/13/2024   LABGLOB 3.2 07/15/2019   AGRATIO 1.1 (L) 07/15/2019   BILITOT 0.5 03/13/2024   ALKPHOS 58  03/13/2024   AST 22 03/13/2024   ALT 11 03/13/2024   ANIONGAP 6 03/15/2024    CBG (last 3)  Recent  Labs    03/14/24 1606 03/14/24 2110 03/15/24 0628  GLUCAP 105* 133* 96      Coagulation Profile: Recent Labs  Lab 03/13/24 1132  INR 1.0     Radiology Studies: ECHOCARDIOGRAM COMPLETE Result Date: 03/14/2024    ECHOCARDIOGRAM REPORT   Patient Name:   DAMONIE PEARDON Date of Exam: 03/14/2024 Medical Rec #:  [id number]      Height:       64.0 in Accession #:    [id number]     Weight:       270.2 lb Date of Birth:  Jun 19, 1951     BSA:          2.223 m Patient Age:    72 years       BP:           136/87 mmHg Patient Gender: F              HR:           69 bpm. Exam Location:  Inpatient Procedure: 2D Echo, Cardiac Doppler and Color Doppler (Both Spectral and Color            Flow Doppler were utilized during procedure). Indications:    TIA  History:        Patient has prior history of Echocardiogram examinations, most                 recent 04/16/2018. Signs/Symptoms:Altered Mental Status; Risk                 Factors:Dyslipidemia, Diabetes and Hypertension.  Sonographer:    Raynelle Callow RDCS Referring Phys: [id number] [facility]  Sonographer Comments: Technically difficult study due to poor echo windows. Image acquisition challenging due to uncooperative patient. Unable to finish exam. Patient could not keep her arms down and was leaning forward in bed. Patient pushed on probe with arms. IMPRESSIONS  1. Left ventricular ejection fraction, by estimation, is 60 to 65%. The left ventricle has normal function. The left ventricle has no regional wall motion abnormalities. There is mild concentric left ventricular hypertrophy. Left ventricular diastolic parameters are consistent with Grade I diastolic dysfunction (impaired relaxation).  2. Right ventricular systolic function is normal. The right ventricular size is normal. Tricuspid regurgitation signal is inadequate for assessing PA pressure.  3. The mitral  valve is degenerative. No evidence of mitral valve regurgitation. No evidence of mitral stenosis.  4. There is a rounded density on the RCC best seen in the PLAX view that may be calcific degeneration but cannot rule out mass. Consider TEE for further evaulation. . The aortic valve is abnormal. There is moderate calcification of the aortic valve. There is moderate thickening of the aortic valve. Aortic valve regurgitation is trivial. Aortic valve sclerosis/calcification is present, without any evidence of aortic stenosis. Aortic valve area, by VTI measures 3.33 cm. Aortic valve mean gradient measures 2.7 mmHg. Aortic valve Vmax measures 1.17 m/s.  5. The inferior vena cava is normal in size with greater than 50% respiratory variability, suggesting right atrial pressure of 3 mmHg.  6. A small pericardial effusion is present. The pericardial effusion is anterior to the right ventricle. Conclusion(s)/Recommendation(s): No intracardiac source of embolism detected on this transthoracic study. Consider a transesophageal echocardiogram to exclude cardiac source of embolism if clinically indicated. FINDINGS  Left Ventricle: Left ventricular ejection fraction, by estimation, is 60 to 65%. The left ventricle has normal function. The left ventricle has no regional wall motion abnormalities. The left  ventricular internal cavity size was normal in size. There is  mild concentric left ventricular hypertrophy. Left ventricular diastolic parameters are consistent with Grade I diastolic dysfunction (impaired relaxation). Normal left ventricular filling pressure. Right Ventricle: The right ventricular size is normal. No increase in right ventricular wall thickness. Right ventricular systolic function is normal. Tricuspid regurgitation signal is inadequate for assessing PA pressure. Left Atrium: Left atrial size was normal in size. Right Atrium: Right atrial size was normal in size. Pericardium: A small pericardial effusion is  present. The pericardial effusion is anterior to the right ventricle. Mitral Valve: The mitral valve is degenerative in appearance. There is mild calcification of the mitral valve leaflet(s). Mild mitral annular calcification. No evidence of mitral valve regurgitation. No evidence of mitral valve stenosis. Tricuspid Valve: The tricuspid valve is normal in structure. Tricuspid valve regurgitation is not demonstrated. No evidence of tricuspid stenosis. Aortic Valve: There is a rounded density on the RCC best seen in the PLAX view that may be calcific degeneration but cannot rule out mass. Consider TEE for further evaulation. The aortic valve is abnormal. There is moderate calcification of the aortic valve. There is moderate thickening of the aortic valve. Aortic valve regurgitation is trivial. Aortic valve sclerosis/calcification is present, without any evidence of aortic stenosis. Aortic valve mean gradient measures 2.7 mmHg. Aortic valve peak gradient measures 5.5 mmHg. Aortic valve area, by VTI measures 3.33 cm. Pulmonic Valve: The pulmonic valve was normal in structure. Pulmonic valve regurgitation is not visualized. No evidence of pulmonic stenosis. Aorta: The aortic root is normal in size and structure. Venous: The inferior vena cava is normal in size with greater than 50% respiratory variability, suggesting right atrial pressure of 3 mmHg. IAS/Shunts: No atrial level shunt detected by color flow Doppler.  LEFT VENTRICLE PLAX 2D LVIDd:         3.60 cm     Diastology LVIDs:         2.70 cm     LV e' medial:    5.44 cm/s LV PW:         1.30 cm     LV E/e' medial:  6.9 LV IVS:        1.20 cm     LV e' lateral:   4.79 cm/s LVOT diam:     2.20 cm     LV E/e' lateral: 7.9 LV SV:         79 LV SV Index:   36 LVOT Area:     3.80 cm  LV Volumes (MOD) LV vol d, MOD A2C: 57.6 ml LV vol d, MOD A4C: 38.9 ml LV vol s, MOD A2C: 12.3 ml LV vol s, MOD A4C: 14.9 ml LV SV MOD A2C:     45.3 ml LV SV MOD A4C:     38.9 ml LV SV MOD  BP:      34.6 ml RIGHT VENTRICLE RV S prime:     12.60 cm/s TAPSE (M-mode): 1.7 cm LEFT ATRIUM             Index        RIGHT ATRIUM           Index LA diam:        4.20 cm 1.89 cm/m   RA Area:     12.70 cm LA Vol (A2C):   35.8 ml 16.10 ml/m  RA Volume:   28.40 ml  12.77 ml/m LA Vol (A4C):   26.0 ml 11.69 ml/m LA Biplane  Vol: 31.1 ml 13.99 ml/m  AORTIC VALVE AV Area (Vmax):    3.19 cm AV Area (Vmean):   3.03 cm AV Area (VTI):     3.33 cm AV Vmax:           117.33 cm/s AV Vmean:          77.533 cm/s AV VTI:            0.237 m AV Peak Grad:      5.5 mmHg AV Mean Grad:      2.7 mmHg LVOT Vmax:         98.50 cm/s LVOT Vmean:        61.900 cm/s LVOT VTI:          0.208 m LVOT/AV VTI ratio: 0.88  AORTA Ao Root diam: 3.30 cm Ao Asc diam:  3.40 cm MITRAL VALVE MV Area (PHT): 3.21 cm    SHUNTS MV Decel Time: 236 msec    Systemic VTI:  0.21 m MV E velocity: 37.70 cm/s  Systemic Diam: 2.20 cm MV A velocity: 80.20 cm/s MV E/A ratio:  0.47 Gaylyn Keas MD Electronically signed by Gaylyn Keas MD Signature Date/Time: 03/14/2024/3:03:13 PM    Final    EEG adult Result Date: 03/14/2024 Arleene Lack, MD     03/14/2024 12:13 PM Patient Name: ANNAJO PETREE MRN: [id number] Epilepsy Attending: Arleene Lack Referring Physician/Provider: Colon Dear, NP Date: 03/14/2024 Duration: 28.41 mins Patient history: 73yo F with ams. EEG to evaluate for seizure Level of alertness: Awake AEDs during EEG study: None Technical aspects: This EEG study was done with scalp electrodes positioned according to the 10-20 International system of electrode placement. Electrical activity was reviewed with band pass filter of 1-70Hz , sensitivity of 7 uV/mm, display speed of 8mm/sec with a 60Hz  notched filter applied as appropriate. EEG data were recorded continuously and digitally stored.  Video monitoring was available and reviewed as appropriate. Description: The posterior dominant rhythm consists of 8 Hz activity of moderate  voltage (25-35 uV) seen predominantly in posterior head regions, symmetric and reactive to eye opening and eye closing. Intermittent generalized 5-7hz  theta slowing was also noted. Hyperventilation and photic stimulation were not performed.   ABNORMALITY - Intermittent slow, generalized IMPRESSION: This study is suggestive of mild diffuse encephalopathy. No seizures or epileptiform discharges were seen throughout the recording. Arleene Lack   MR BRAIN WO CONTRAST Result Date: 03/13/2024 CLINICAL DATA:  Neuro deficit, concern for stroke, right-sided deficits. EXAM: MRI HEAD WITHOUT CONTRAST TECHNIQUE: Multiplanar, multiecho pulse sequences of the brain and surrounding structures were obtained without intravenous contrast. COMPARISON:  Same day CT head and CTA head and neck. FINDINGS: Brain: No acute infarct. No evidence of intracranial hemorrhage. Scattered and confluent T2/FLAIR signal abnormality within the periventricular and subcortical white matter which is significantly increased since 2012. Additional signal abnormality in the posterior right temporal and right occipital lobes with suggestion of mild gyral expansion. There is additional T2/FLAIR signal abnormality in the bilateral cerebellar hemispheres which is significantly increased from prior. Additional signal abnormality involving the basal ganglia and pons. No midline shift. Small focus of encephalomalacia in the lateral right temporal lobe which may reflect remote trauma versus infarct. The basilar cisterns are patent. No extra-axial fluid collections. Ventricles: Normal size and configuration of the ventricles. Vascular: Skull base flow voids are visualized. Skull and upper cervical spine: No focal abnormality. Sinuses/Orbits: Orbits are symmetric. Paranasal sinuses are clear. Other: Mastoid air cells are clear.  IMPRESSION: Multiple sequences are degraded by motion artifact. No acute infarct. Significantly increased white matter signal  abnormality compared to 2012 likely reflecting chronic microvascular ischemic changes. Superimposed on the background of chronic white matter changes there are additional areas of signal abnormality within the right temporal and right occipital lobes as well as the bilateral basal ganglia and bilateral cerebellar hemispheres with possible edema. Findings could reflect PRES in the appropriate clinical context. Electronically Signed   By: Denny Flack M.D.   On: 03/13/2024 18:35   DG Chest 1 View Result Date: 03/13/2024 CLINICAL DATA:  Stroke.  History of CHF. EXAM: CHEST  1 VIEW COMPARISON:  Chest radiograph dated 05/05/2011. FINDINGS: No focal consolidation, pleural effusion, pneumothorax. Borderline cardiomegaly. No acute osseous pathology. IMPRESSION: 1. No active disease. 2. Borderline cardiomegaly. Electronically Signed   By: Angus Bark M.D.   On: 03/13/2024 14:24   CT ANGIO HEAD NECK W WO CM W PERF (CODE STROKE) Result Date: 03/13/2024 CLINICAL DATA:  73 year old female code stroke presentation. Dysarthria, weakness. Questionable hyperdense left MCA on plain head CT. EXAM: CT ANGIOGRAPHY HEAD AND NECK CT PERFUSION BRAIN TECHNIQUE: Multidetector CT imaging of the head and neck was performed using the standard protocol during bolus administration of intravenous contrast. Multiplanar CT image reconstructions and MIPs were obtained to evaluate the vascular anatomy. Carotid stenosis measurements (when applicable) are obtained utilizing NASCET criteria, using the distal internal carotid diameter as the denominator. Multiphase CT imaging of the brain was performed following IV bolus contrast injection. Subsequent parametric perfusion maps were calculated using RAPID software. RADIATION DOSE REDUCTION: This exam was performed according to the departmental dose-optimization program which includes automated exposure control, adjustment of the mA and/or kV according to patient size and/or use of iterative  reconstruction technique. CONTRAST:  OMNIPAQUE IOHEXOL 350 MG/ML SOLN COMPARISON:  Plain head CT 1135 hours today. FINDINGS: CT Brain Perfusion Findings: ASPECTS: 10 (chronic encephalomalacia in the right hemisphere). CTP is motion degraded. CBF (<30%) Volume: 0mL, with no CBF for CBV parameter abnormality detected. Perfusion (Tmax>6.0s) volume: 11mL, although right temporal lobe, inferior frontal gyrus Mismatch Volume: Up to 11mL Infarction Location:No core infarct detected. CTA NECK Skeleton: Carious and absent dentition. No acute osseous abnormality identified. Upper chest: Negative. Other neck: Nonvascular neck soft tissue spaces appear within normal limits. Aortic arch: Tortuous aortic arch with soft and calcified plaque. Three vessel arch configuration. Right carotid system: Brachiocephalic artery tortuosity, soft and calcified plaque without stenosis. Tortuous proximal right CCA with mild calcified plaque, no stenosis. Mild distal right CCA and mild right carotid bifurcation calcified plaque without stenosis. Mild right carotid bulb calcified plaque. No significant right ICA stenosis to the skull base. Left carotid system: Similar tortuosity and mild atherosclerosis without hemodynamically significant stenosis. Vertebral arteries: Tortuous brachiocephalic artery with soft plaque, no stenosis. Calcified plaque at the right vertebral artery origin on series 5, image 137 with moderate stenosis. The right vertebral remains patent to the skull base and is tortuous in the neck with mild additional calcified plaque, no other significant stenosis. Proximal left subclavian artery is tortuous with soft plaque. The left vertebral artery origin is patent but appears stenotic, with highly diminutive and poorly enhancing left V1 vertebral artery (series 7, image 253). Largely occluded left V2 segment. Reconstitution of the left V3 segment from a muscular branch in the upper neck. Distal left V3 segment appears  irregular and stenotic at the skull base. CTA HEAD Posterior circulation: Left V4 segment appears functionally occluded, minimally enhancing. Right  vertebral V4 is patent and supplies the basilar without stenosis. Patent basilar artery. Patent SCA and PCA origins. Small left posterior communicating artery. Left PCA branches are patent with mild irregularity, mild to moderate left P3 stenosis series 13, image 26. Right PCA branches are patent with mild irregularity. Anterior circulation: Both ICA siphons are patent. Left siphon calcified plaque is mild to moderate, with only mild left siphon stenosis. Normal left posterior communicating artery origin. Right siphon is tortuous with mild to moderate calcified plaque and mild to moderate stenosis at the anterior genu series 10, image 100. Patent carotid termini. Patent MCA and ACA origins. Tortuous A1 segments. Right A1 appears mildly dominant. Normal anterior communicating artery. Bilateral ACA branches are patent. There is mild right A2 irregularity and stenosis on series 13, image 23. Right MCA M1 segment is tortuous. Right MCA bifurcation is patent without stenosis. Right MCA branches appear patent. Left MCA M1 segment is tortuous. Left MCA bifurcation is patent. Left MCA branches appear patent. No branch stenosis or occlusion identified. Venous sinuses: Early contrast timing, not evaluated. Anatomic variants: Affectively dominant right vertebral artery. Mildly dominant right ACA A1. Review of the MIP images confirms the above findings Preliminary report of the CTA discussed by telephone with Dr. ASHISH ARORA on 03/13/2024 at 1201 hours. IMPRESSION: 1. Motion degraded CTP. No infarct core detected, questionable 11 mL oligemia primarily in the right temporal lobe. 2. 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 Only the V3 segment demonstrates relatively normal enhancement, due to  reconstitution from a muscular branch there. 3. Right Vertebral Artery supplies the basilar, with Moderate origin stenosis due to calcified plaque. 4. No extracranial carotid stenosis. Mild to moderate ICA siphon plaque with mild to moderate right ICA anterior genu stenosis. No other hemodynamically significant anterior or posterior circulation stenosis. 5.  Aortic Atherosclerosis (ICD10-I70.0). Electronically Signed   By: Marlise Simpers M.D.   On: 03/13/2024 12:20   CT HEAD CODE STROKE WO CONTRAST Result Date: 03/13/2024 CLINICAL DATA:  Code stroke. 73 year old female with right side deficit. EXAM: CT HEAD WITHOUT CONTRAST TECHNIQUE: Contiguous axial images were obtained from the base of the skull through the vertex without intravenous contrast. RADIATION DOSE REDUCTION: This exam was performed according to the departmental dose-optimization program which includes automated exposure control, adjustment of the mA and/or kV according to patient size and/or use of iterative reconstruction technique. COMPARISON:  Brain MRI 05/06/2011.  Head CT 05/05/2011. FINDINGS: Brain: Cerebral volume not significantly changed since 2012. No midline shift, ventriculomegaly, mass effect, evidence of mass lesion, intracranial hemorrhage or evidence of cortically based acute infarction. Patchy bilateral cerebral white matter hypodensity which is fairly symmetric. However, there is chronic cortical encephalomalacia in the posterior and lateral right temporal lobe, right occipital pole stable since 2012. See series 3, images 15 through 20. Vascular: Questionable hyperdense left MCA branch in the sylvian fissure series 6, image 41. Skull: Intact.  No acute osseous abnormality identified. Sinuses/Orbits: Progressed chronic right maxillary sinus disease with mucoperiosteal thickening. Chronic right middle ear and mastoid opacification is stable. Other: No significant gaze deviation. Visualized orbits and scalp soft tissues are within normal  limits. ASPECTS [facility] Stroke Program Early CT Score) - Ganglionic level infarction (caudate, lentiform nuclei, internal capsule, insula, M1-M3 cortex): - Supraganglionic infarction (M4-M6 cortex): Total score (0-10 with 10 being normal): IMPRESSION: 1. Questionable Hyperdense Left MCA branch in the Sylvian fissure. But no cortically based acute infarct or acute intracranial  hemorrhage identified. ASPECTS 10. 2. 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 Other chronic white matter disease. These results were communicated to Dr. Arora at 11:42 am on 03/13/2024 by text page via the [facility] messaging system. Electronically Signed   By: Marlise Simpers M.D.   On: 03/13/2024 11:43       Feliciana Horn M.D. [facility] 03/15/2024, 11:19 AM  Available via Epic secure chat 7am-7pm After 7 pm, please refer to night coverage provider listed on amion.

## 2024-03-15 NOTE — Care Management (Signed)
    Durable Medical Equipment  (From admission, onward)           Start     Ordered   03/15/24 1431  For home use only DME Other see comment  Once       Comments: Patient needs a hoyer lift  Question:  Length of Need  Answer:  Lifetime   03/15/24 1431   03/15/24 1336  For home use only DME lightweight manual wheelchair with seat cushion  Once       Comments: Patient suffers from weakness, cerebral edema  which impairs their ability to perform daily activities like toileting in the home.  A walker will not resolve  issue with performing activities of daily living. A wheelchair will allow patient to safely perform daily activities. Patient is not able to propel themselves in the home using a standard weight wheelchair due to general weakness. Patient can self propel in the lightweight wheelchair. Length of need Lifetime. Accessories: elevating leg rests (ELRs), wheel locks, extensions and anti-tippers.   03/15/24 1335

## 2024-03-15 NOTE — Progress Notes (Signed)
 NEUROLOGY CONSULT FOLLOW UP NOTE   Date of service: Mar 15, 2024 Patient Name: Kayla Hunt MRN:  [id number] DOB:  [birth year]  Interval Hx/subjective   Patient remains hemodynamically stable and afebrile overnight.  She remains disoriented to place, time and situation, but language has improved today.  EEG demonstrates encephalopathy but no seizure activity.  Vitals   Vitals:   03/14/24 2332 03/15/24 0421 03/15/24 0809 03/15/24 0900  BP: 138/65 116/63 (!) 140/69   Pulse: 63 64 63   Resp: 18 18 18    Temp: 97.7 F (36.5 C) 98.5 F (36.9 C) 97.9 F (36.6 C)   TempSrc: Oral Oral Oral   SpO2: 100% 94% 95%   Weight:    66.5 kg  Height:    5\' 1"  (1.549 m)     Body mass index is 27.7 kg/m.  Physical Exam   Constitutional: Chronically ill-appearing elderly patient in no acute distress Psych: Affect appropriate to situation.  Eyes: No scleral injection.  HENT: No OP obstrucion.  Head: Normocephalic.  Cardiovascular: Normal rate and regular rhythm.  Respiratory: Effort normal, non-labored breathing.  Skin: WDI.   Neurologic Examination    NEURO:  Mental Status: Patient is alert, oriented to person  but disoriented to place, time and situation, she is unable to give history of present illness Speech/Language: speech is with mild dysarthria, she is able to name 2 out of 3 objects and can repeat sentences with improved dysarthria  Cranial Nerves:  II: PERRL.  III, IV, VI: EOMI. Eyelids elevate symmetrically.  VII: Smile is symmetrical.  VIII: hearing intact to voice. IX, X: Voice is dysarthric but improved from yesterday XII: tongue is midline without fasciculations. Motor: Able to move all 4 extremities with antigravity strength Tone: is normal and bulk is normal Sensation- Intact to light touch bilaterally.  Gait- deferred    Medications  Current Facility-Administered Medications:    0.9 %  sodium chloride  infusion, , Intravenous, Continuous, Feliciana Horn, MD,  Last Rate: 75 mL/hr at 03/13/24 2142, New Bag at 03/13/24 2142   acetaminophen  (TYLENOL ) tablet 650 mg, 650 mg, Oral, Q6H PRN **OR** acetaminophen  (TYLENOL ) suppository 650 mg, 650 mg, Rectal, Q6H PRN, Akula, Vijaya, MD   allopurinol  (ZYLOPRIM ) tablet 100 mg, 100 mg, Oral, q AM, Akula, Vijaya, MD, 100 mg at 03/15/24 0606   aspirin chewable tablet 81 mg, 81 mg, Oral, q AM, Akula, Vijaya, MD, 81 mg at 03/15/24 0606   atorvastatin (LIPITOR) tablet 80 mg, 80 mg, Oral, Daily, Akula, Vijaya, MD, 80 mg at 03/15/24 0917   clopidogrel (PLAVIX) tablet 75 mg, 75 mg, Oral, Daily, Akula, Vijaya, MD, 75 mg at 03/15/24 0918   heparin  injection 5,000 Units, 5,000 Units, Subcutaneous, Q8H, Akula, Vijaya, MD, 5,000 Units at 03/15/24 0607   hydrALAZINE  (APRESOLINE ) injection 5 mg, 5 mg, Intravenous, Q8H PRN, Akula, Vijaya, MD   hydrALAZINE  (APRESOLINE ) tablet 50 mg, 50 mg, Oral, Q8H, Akula, Vijaya, MD, 50 mg at 03/15/24 0606   losartan  (COZAAR ) tablet 50 mg, 50 mg, Oral, Daily, 50 mg at 03/15/24 0918 **AND** hydrochlorothiazide  (HYDRODIURIL ) tablet 12.5 mg, 12.5 mg, Oral, Daily, Kayla Hunt, RPH, 12.5 mg at 03/15/24 9562   insulin  aspart (novoLOG ) injection 0-6 Units, 0-6 Units, Subcutaneous, TID WC, Akula, Vijaya, MD   levothyroxine  (SYNTHROID ) tablet 100 mcg, 100 mcg, Oral, Q0600, Akula, Vijaya, MD, 100 mcg at 03/15/24 0606   metoprolol  tartrate (LOPRESSOR ) tablet 50 mg, 50 mg, Oral, BID, Akula, Vijaya, MD, 50 mg at 03/15/24 [phone]  ondansetron  (ZOFRAN ) tablet 4 mg, 4 mg, Oral, Q6H PRN **OR** ondansetron  (ZOFRAN ) injection 4 mg, 4 mg, Intravenous, Q6H PRN, Akula, Vijaya, MD   potassium chloride SA (KLOR-CON M) CR tablet 40 mEq, 40 mEq, Oral, Once, Feliciana Horn, MD   sodium chloride  flush (NS) 0.9 % injection 3 mL, 3 mL, Intravenous, Once, Feliciana Horn, MD  Labs and Diagnostic Imaging   CBC:  Recent Labs  Lab 03/13/24 1132 03/13/24 1134 03/13/24 1420 03/14/24 0649  WBC 4.5  --   --  3.4*  NEUTROABS 2.1   --   --  1.0*  HGB 13.4   < > 11.9* 13.3  HCT 43.1   < > 35.0* 42.1  MCV 91.9  --   --  88.3  PLT 172  --   --  192   < > = values in this interval not displayed.    Basic Metabolic Panel:  Lab Results  Component Value Date   NA 138 03/15/2024   K 3.8 03/15/2024   CO2 26 03/15/2024   GLUCOSE 104 (H) 03/15/2024   BUN 22 03/15/2024   CREATININE 1.81 (H) 03/15/2024   CALCIUM  8.5 (L) 03/15/2024   GFRNONAA 30 (L) 03/15/2024   GFRAA 32 (L) 02/24/2021   Lipid Panel:  Lab Results  Component Value Date   LDLCALC 438 (H) 03/14/2024   HgbA1c:  Lab Results  Component Value Date   HGBA1C 6.3 (H) 03/14/2024   Alcohol Level     Component Value Date/Time   [facility] <15 03/13/2024 1132   INR  Lab Results  Component Value Date   INR 1.0 03/13/2024   APTT  Lab Results  Component Value Date   APTT 34 03/13/2024   TSH 96.872 T4 free 0.32 Free triiodothyronine <0.3  CT Head without contrast(Personally reviewed): Questionable hyperdense left MCA, chronic small vessel ischemic changes  CT angio Head and Neck with contrast(Personally reviewed): No emergent LVO, age-indeterminate left vertebral artery occlusion  MRI Brain(Personally reviewed): Chronic white matter changes, additional areas of signal abnormality in bilateral basal ganglia and cerebellar hemispheres with possible edema, possibly indicating PRES  rEEG:  ABNORMALITY - Intermittent slow, generalized   IMPRESSION: This study is suggestive of mild diffuse encephalopathy. No seizures or epileptiform discharges were seen throughout the recording.  Assessment   Kayla Hunt is a 73 y.o. 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  MRI reveals changes associated with PRES, although she has not been very hypertensive.  On exam today,  she is disoriented to time and situation but is able to name most objects and can repeat phrases in a dysarthric voice.  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  TSH is noted to be quite elevated, and hypothyroidism could certainly be contributing to her altered mental status. On today's exam, she continues to be encephalopathic.  Expect that this will improve over the next few days.   Impression: Posterior reversible encephalopathy syndrome Metabolic encephalopathy in the setting of severe hypothyroidism  Recommendations  -BP goal normotension - Treatment of hypothyroidism per primary team -Neurology will follow along peripherally ______________________________________________________________________ Patient seen by NP with MD, MD to edit note as needed.  Signed, Cortney E Bucky Cardinal, NP [facility]    Attending Neurohospitalist Addendum Patient seen and  examined with APP/Resident. Agree with the history and physical as documented above. Agree with the plan as documented, which I helped formulate. I have independently reviewed the chart, obtained history, review of systems and examined the patient.I have personally reviewed pertinent head/neck/spine imaging (CT/MRI). Please feel free to call with any questions.  -- Tona Francis, MD Neurologist [facility] Pager: [phone]

## 2024-03-15 NOTE — TOC Initial Note (Signed)
 Transition of Care [facility]) - Initial/Assessment Note    Patient Details  Name: Kayla Hunt MRN: [id number] Date of Birth: Jul 08, 1951  [facility] CM/SW Contact:    Tandy Fam, LCSW Phone Number: 03/15/2024, 2:35 PM  Clinical Narrative:      CSW met with daughter, Lynnie Saucier, at bedside to discuss recommendation for SNF. Lynnie Saucier does not want SNF, wants to take the patient home instead. Patient already has a hospital bed, walker, and bedside commode at home, but needs a new wheelchair as her current one is so old that one of the wheels is about to fall off. Daughter said she thought her DME was provided through [employer], but wasn't sure and indicated that any DME company that could provide would be fine. Daughter also in agreement with hoyer lift. Orders for wheelchair and hoyer lift sent to [employer] for delivery tomorrow.  CSW also discussed with daughter about home health. She is in agreement, no preference for agency. Daughter requesting additional hours for home health aides and a referral for [facility]. CSW obtained [facility] referral form off the internet and completed and faxed to [email or web address] for review for [facility] services. Patient's daughter and granddaughter are already providing care, but it would be helpful if they could either have additional help or be paid for the care that they're providing through [facility]. Referral sent, paperwork will be provided to daughter for her records. CSW to follow.           Expected Discharge Plan: Home w Home Health Services Barriers to Discharge: Continued Medical Work up   Patient Goals and CMS Choice Patient states their goals for this hospitalization and ongoing recovery are:: patient unable to participate in goal setting, not fully oriented CMS Medicare.gov Compare Post Acute Care list provided to:: Patient Represenative (must comment) Choice offered to / list presented to : Adult Children      Expected Discharge Plan and Services      Post Acute Care Choice: Home Health Living arrangements for the past 2 months: Single Family Home                 DME Arranged: Community education officer wheelchair with seat cushion, Other see comment Alen Amy lift) DME Agency: [employer] Date DME Agency Contacted: 03/15/24   Representative spoke with at DME Agency: Zack            Prior Living Arrangements/Services Living arrangements for the past 2 months: Single Family Home Lives with:: Adult Children Patient language and need for interpreter reviewed:: No Do you feel safe going back to the place where you live?: Yes      Need for Family Participation in Patient Care: Yes (Comment) Care giver support system in place?: Yes (comment) Current home services: DME, [facility] Criminal Activity/Legal Involvement Pertinent to Current Situation/Hospitalization: No - Comment as needed  Activities of Daily Living      Permission Sought/Granted Permission sought to share information with : Family Supports Permission granted to share information with : Yes, Verbal Permission Granted  Share Information with NAME: Lynnie Saucier     Permission granted to share info w Relationship: Daughter     Emotional Assessment   Attitude/Demeanor/Rapport: Unable to Assess Affect (typically observed): Unable to Assess Orientation: : Oriented to Self Alcohol / Substance Use: Not Applicable Psych Involvement: No (comment)  Admission diagnosis:  TIA (transient ischemic attack) [G45.9] Stroke-like symptoms [R29.90] Altered mental status, unspecified altered mental status type [R41.82] Patient Active Problem List   Diagnosis  Date Noted   TIA (transient ischemic attack) 03/13/2024   Hyperkalemia 04/11/2018   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   Osteoarthritis of both knees 03/11/2015   Dyslipidemia 10/02/2007   MILD MENTAL RETARDATION 10/02/2007   Hearing loss 10/02/2007   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    CHRONIC KIDNEY DISEASE STAGE III (MODERATE) 11/27/2004   Gout, unspecified 01/06/2003   Hypothyroidism 05/28/1998   Essential hypertension, benign 04/15/1998   PCP:  Marius Siemens, NP Pharmacy:   [facility] [phone] Jonette Nestle, [place] - [address] AT [address] [place] [address] Phone: [phone] Fax: [phone]  [email or web address] - [place], [place] - [address] [place] [address] Phone: [phone] Fax: [phone]  [facility] [address] [place] [address] Phone: [phone] Fax: [phone]     Social Drivers of Health (SDOH) Social History: SDOH Screenings   Housing: Patient Unable To Answer (03/15/2024)  Transportation Needs: Patient Unable To Answer (03/15/2024)  Utilities: Patient Unable To Answer (03/15/2024)  Depression (PHQ2-9): Low Risk  (01/27/2021)  Social Connections: Patient Unable To Answer (03/15/2024)  Tobacco Use: High Risk (01/27/2021)   SDOH Interventions:     Readmission Risk Interventions     No data to display

## 2024-03-15 NOTE — Plan of Care (Signed)
   Problem: Nutrition: Goal: Adequate nutrition will be maintained Outcome: Progressing

## 2024-03-15 NOTE — Plan of Care (Signed)
   Problem: Education: Goal: Knowledge of General Education information will improve Description Including pain rating scale, medication(s)/side effects and non-pharmacologic comfort measures Outcome: Progressing

## 2024-03-16 DIAGNOSIS — I10 Essential (primary) hypertension: Secondary | ICD-10-CM | POA: Diagnosis not present

## 2024-03-16 DIAGNOSIS — R2990 Unspecified symptoms and signs involving the nervous system: Secondary | ICD-10-CM | POA: Diagnosis not present

## 2024-03-16 DIAGNOSIS — G459 Transient cerebral ischemic attack, unspecified: Secondary | ICD-10-CM | POA: Diagnosis not present

## 2024-03-16 DIAGNOSIS — E785 Hyperlipidemia, unspecified: Secondary | ICD-10-CM | POA: Diagnosis not present

## 2024-03-16 LAB — GLUCOSE, CAPILLARY
Glucose-Capillary: 75 mg/dL (ref 70–99)
Glucose-Capillary: 117 mg/dL — ABNORMAL HIGH (ref 70–99)
Glucose-Capillary: 153 mg/dL — ABNORMAL HIGH (ref 70–99)
Glucose-Capillary: 101 mg/dL — ABNORMAL HIGH (ref 70–99)

## 2024-03-16 NOTE — Plan of Care (Signed)
  Problem: Education: Goal: Knowledge of General Education information will improve Description: Including pain rating scale, medication(s)/side effects and non-pharmacologic comfort measures Outcome: Not Progressing   Problem: Health Behavior/Discharge Planning: Goal: Ability to manage health-related needs will improve Outcome: Not Progressing   Problem: Clinical Measurements: Goal: Ability to maintain clinical measurements within normal limits will improve Outcome: Not Progressing Goal: Will remain free from infection Outcome: Not Progressing Goal: Diagnostic test results will improve Outcome: Not Progressing Goal: Respiratory complications will improve Outcome: Not Progressing Goal: Cardiovascular complication will be avoided Outcome: Not Progressing   Problem: Activity: Goal: Risk for activity intolerance will decrease Outcome: Not Progressing   Problem: Nutrition: Goal: Adequate nutrition will be maintained Outcome: Not Progressing   Problem: Coping: Goal: Level of anxiety will decrease Outcome: Not Progressing   Problem: Elimination: Goal: Will not experience complications related to bowel motility Outcome: Not Progressing Goal: Will not experience complications related to urinary retention Outcome: Not Progressing   Problem: Pain Managment: Goal: General experience of comfort will improve and/or be controlled Outcome: Not Progressing   Problem: Safety: Goal: Ability to remain free from injury will improve Outcome: Not Progressing   Problem: Skin Integrity: Goal: Risk for impaired skin integrity will decrease Outcome: Not Progressing   Problem: Education: Goal: Knowledge of disease or condition will improve Outcome: Not Progressing Goal: Knowledge of secondary prevention will improve (MUST DOCUMENT ALL) Outcome: Not Progressing Goal: Knowledge of patient specific risk factors will improve (DELETE if not current risk factor) Outcome: Not Progressing    Problem: Ischemic Stroke/TIA Tissue Perfusion: Goal: Complications of ischemic stroke/TIA will be minimized Outcome: Not Progressing   Problem: Coping: Goal: Will verbalize positive feelings about self Outcome: Not Progressing Goal: Will identify appropriate support needs Outcome: Not Progressing   Problem: Health Behavior/Discharge Planning: Goal: Ability to manage health-related needs will improve Outcome: Not Progressing Goal: Goals will be collaboratively established with patient/family Outcome: Not Progressing   Problem: Self-Care: Goal: Ability to participate in self-care as condition permits will improve Outcome: Not Progressing Goal: Verbalization of feelings and concerns over difficulty with self-care will improve Outcome: Not Progressing Goal: Ability to communicate needs accurately will improve Outcome: Not Progressing   Problem: Nutrition: Goal: Risk of aspiration will decrease Outcome: Not Progressing Goal: Dietary intake will improve Outcome: Not Progressing   Problem: Education: Goal: Ability to describe self-care measures that may prevent or decrease complications (Diabetes Survival Skills Education) will improve Outcome: Not Progressing Goal: Individualized Educational Video(s) Outcome: Not Progressing   Problem: Coping: Goal: Ability to adjust to condition or change in health will improve Outcome: Not Progressing   Problem: Fluid Volume: Goal: Ability to maintain a balanced intake and output will improve Outcome: Not Progressing   Problem: Health Behavior/Discharge Planning: Goal: Ability to identify and utilize available resources and services will improve Outcome: Not Progressing Goal: Ability to manage health-related needs will improve Outcome: Not Progressing   Problem: Metabolic: Goal: Ability to maintain appropriate glucose levels will improve Outcome: Not Progressing   Problem: Nutritional: Goal: Maintenance of adequate nutrition will  improve Outcome: Not Progressing Goal: Progress toward achieving an optimal weight will improve Outcome: Not Progressing   Problem: Skin Integrity: Goal: Risk for impaired skin integrity will decrease Outcome: Not Progressing   Problem: Tissue Perfusion: Goal: Adequacy of tissue perfusion will improve Outcome: Not Progressing

## 2024-03-16 NOTE — Plan of Care (Signed)
  Problem: Education: Goal: Knowledge of General Education information will improve Description: Including pain rating scale, medication(s)/side effects and non-pharmacologic comfort measures Outcome: Not Progressing   Problem: Health Behavior/Discharge Planning: Goal: Ability to manage health-related needs will improve Outcome: Not Progressing   Problem: Clinical Measurements: Goal: Ability to maintain clinical measurements within normal limits will improve Outcome: Progressing Goal: Will remain free from infection Outcome: Progressing Goal: Diagnostic test results will improve Outcome: Progressing Goal: Respiratory complications will improve Outcome: Progressing   Problem: Activity: Goal: Risk for activity intolerance will decrease Outcome: Not Progressing

## 2024-03-16 NOTE — Progress Notes (Signed)
 [facility]                                                                               Kayla Hunt, is a 73 y.o. female, DOB - Feb 05, 1951, ZOX:[id number] Admit date - 03/13/2024    Outpatient Primary MD for the patient is Marius Siemens, NP  LOS - 2  days    Brief summary   Kayla Hunt is a 73 y.o. female with medical history significant of hypertension, hyperlipidemia. Hypothyroidism, seizures, type 2 DM , chronic diastolic CHF, stage 4 CKD, was brought in from home as a code stroke. As per the daughter, she has been confused, with slurred speech and word finding difficulty and has generalized weakness, with right sided facial droop and right sided weakness.  MRI brain is negative for acute stroke. Superimposed on the background of chronic white matter changes there are additional areas of signal abnormality within the right temporal and right occipital lobes as well as the bilateral basal ganglia and bilateral cerebellar hemispheres with possible edema. Findings reflect PRES in the appropriate clinical context. Neurology on board.    Assessment & Plan    Assessment and Plan:  Acute metabolic encephalopathy probably from PRES and severe hypothyroidism. Ammonia level is wnl. TSH is elevated.  Echocardiogram reviewed. Showed LVEF of 60 to 65%. Grade 1 diastolic dysfunction.   She was found to have a rounded density on the RCC best seen in the PLAX view , rule out mass.  Cardiology consulted for TEE. Aaron Aas No signs of infection. UA is negative. CXR No active disease.  LDL is 438. Added atorvastatin  80 mg daily.  MRI reviewed and discussed with the daughter over the phone.  SLP/PT/OT Eval.  TEE scheduled for Monday.  She is more alert and answering simple questions.     Hypokalemia Replaced.  Repeat level wnl.    Hypothyroidism;  TSH around 95. Suspect from non compliance.  Free t4 and free t3 are  severely low.  Discussed with her daughter, who  confirmed that she might not be taking her meds.    Hyperlipidemia:  LDL is 438. Continue with lipitor  80 mg daily.    Stage 4 CKD Creatinine stable at 1.8.    H/o Seizures:  Neurology on board.  EEG done and showed  mild diffuse encephalopathy. No seizures or epileptiform discharges were seen throughout the recording.    Type 2 Diabetes Mellitus:  CBG (last 3)  Recent Labs    03/16/24 0612 03/16/24 1222 03/16/24 1611  GLUCAP 75 117* 153*   A1c is 6.3. No change in meds.     Chronic diastolic heart failure:  Echo reviewed.  She appears wuvolemic.     Estimated body mass index is 27.7 kg/m as calculated from the following:   Height as of this encounter: 5\' 1"  (1.549 m).   Weight as of this encounter: 66.5 kg.  Code Status: full code.  DVT Prophylaxis:  heparin  injection 5,000 Units Start: 03/14/24 1445Heparin   Level of Care: Level of care: Telemetry Medical Family Communication: Updated patient's care giver/ daughter over the phone.   Disposition Plan:     Remains inpatient appropriate:  pending further work up.   Procedures:  EEG.  Echo.   Consultants:   Neurology.   Antimicrobials:   Anti-infectives (From admission, onward)    None        Medications  Scheduled Meds:  allopurinol   100 mg Oral q AM   aspirin   81 mg Oral q AM   atorvastatin   80 mg Oral Daily   clopidogrel   75 mg Oral Daily   heparin  injection (subcutaneous)  5,000 Units Subcutaneous Q8H   hydrALAZINE   50 mg Oral Q8H   losartan   50 mg Oral Daily   And   hydrochlorothiazide   12.5 mg Oral Daily   insulin  aspart  0-6 Units Subcutaneous TID WC   levothyroxine   100 mcg Oral Q0600   metoprolol  tartrate  50 mg Oral BID   potassium chloride   40 mEq Oral Once   sodium chloride  flush  3 mL Intravenous Once   Continuous Infusions:  sodium chloride  75 mL/hr at 03/13/24 2142   PRN Meds:.acetaminophen  **OR** acetaminophen , hydrALAZINE , ondansetron  **OR** ondansetron  (ZOFRAN )  IV    Subjective:   Kayla Hunt was seen and examined today. No new complaints.   Objective:   Vitals:   03/16/24 0601 03/16/24 0824 03/16/24 1221 03/16/24 1609  BP: 137/75 (!) 109/96 (!) 129/55 (!) 125/101  Pulse:  62 (!) 59 (!) 58  Resp:  17 16 17   Temp:  98.5 F (36.9 C) 98.2 F (36.8 C) 98.6 F (37 C)  TempSrc:  Oral Oral Oral  SpO2:  97% 99% 98%  Weight:      Height:        Intake/Output Summary (Last 24 hours) at 03/16/2024 1637 Last data filed at 03/16/2024 0900 Gross per 24 hour  Intake 300 ml  Output --  Net 300 ml   Filed Weights   03/15/24 0900  Weight: 66.5 kg     Exam General exam: Appears calm and comfortable  Respiratory system: Clear to auscultation. Respiratory effort normal. Cardiovascular system: S1 & S2 heard, RRR. No JVD,  Gastrointestinal system: Abdomen is nondistended, soft and nontender.  Central nervous system: Alert and oriented to person only.  Extremities: Symmetric 5 x 5 power. Skin: No rashes,  Psychiatry: mood is appropriate.     Data Reviewed:  I have personally reviewed following labs and imaging studies   CBC Lab Results  Component Value Date   WBC 3.4 (L) 03/14/2024   RBC 4.77 03/14/2024   HGB 13.3 03/14/2024   HCT 42.1 03/14/2024   MCV 88.3 03/14/2024   MCH 27.9 03/14/2024   PLT 192 03/14/2024   MCHC 31.6 03/14/2024   RDW 15.6 (H) 03/14/2024   LYMPHSABS 2.0 03/14/2024   MONOABS 0.3 03/14/2024   EOSABS 0.1 03/14/2024   BASOSABS 0.0 03/14/2024     Last metabolic panel Lab Results  Component Value Date   NA 138 03/15/2024   K 3.8 03/15/2024   CL 106 03/15/2024   CO2 26 03/15/2024   BUN 22 03/15/2024   CREATININE 1.81 (H) 03/15/2024   GLUCOSE 104 (H) 03/15/2024   GFRNONAA 30 (L) 03/15/2024   GFRAA 32 (L) 02/24/2021   CALCIUM  8.5 (L) 03/15/2024   PHOS 3.2 05/11/2023   PROT 6.5 03/13/2024   ALBUMIN 2.5 (L) 03/13/2024   LABGLOB 3.2 07/15/2019   AGRATIO 1.1 (L) 07/15/2019   BILITOT 0.5 03/13/2024    ALKPHOS 58 03/13/2024   AST 22 03/13/2024   ALT 11 03/13/2024   ANIONGAP 6 03/15/2024    CBG (  last 3)  Recent Labs    03/16/24 0612 03/16/24 1222 03/16/24 1611  GLUCAP 75 117* 153*      Coagulation Profile: Recent Labs  Lab 03/13/24 1132  INR 1.0     Radiology Studies: No results found.      Feliciana Horn M.D. [facility] 03/16/2024, 4:37 PM  Available via Epic secure chat 7am-7pm After 7 pm, please refer to night coverage provider listed on amion.

## 2024-03-17 DIAGNOSIS — E785 Hyperlipidemia, unspecified: Secondary | ICD-10-CM | POA: Diagnosis not present

## 2024-03-17 DIAGNOSIS — Z515 Encounter for palliative care: Secondary | ICD-10-CM

## 2024-03-17 DIAGNOSIS — Z7189 Other specified counseling: Secondary | ICD-10-CM

## 2024-03-17 DIAGNOSIS — G459 Transient cerebral ischemic attack, unspecified: Secondary | ICD-10-CM | POA: Diagnosis not present

## 2024-03-17 DIAGNOSIS — R2990 Unspecified symptoms and signs involving the nervous system: Secondary | ICD-10-CM | POA: Diagnosis not present

## 2024-03-17 DIAGNOSIS — I10 Essential (primary) hypertension: Secondary | ICD-10-CM | POA: Diagnosis not present

## 2024-03-17 DIAGNOSIS — E039 Hypothyroidism, unspecified: Secondary | ICD-10-CM | POA: Diagnosis not present

## 2024-03-17 DIAGNOSIS — G9341 Metabolic encephalopathy: Secondary | ICD-10-CM | POA: Diagnosis not present

## 2024-03-17 DIAGNOSIS — I6783 Posterior reversible encephalopathy syndrome: Secondary | ICD-10-CM | POA: Diagnosis not present

## 2024-03-17 LAB — GLUCOSE, CAPILLARY
Glucose-Capillary: 117 mg/dL — ABNORMAL HIGH (ref 70–99)
Glucose-Capillary: 228 mg/dL — ABNORMAL HIGH (ref 70–99)
Glucose-Capillary: 127 mg/dL — ABNORMAL HIGH (ref 70–99)
Glucose-Capillary: 123 mg/dL — ABNORMAL HIGH (ref 70–99)

## 2024-03-17 MED ORDER — SODIUM CHLORIDE 0.9 % IV SOLN: INTRAVENOUS | Status: DC

## 2024-03-17 MED ORDER — ALLOPURINOL 100 MG PO TABS
100.0000 mg | ORAL_TABLET | Freq: Every morning | ORAL | Status: DC
  Administered 2024-03-17 – 2024-03-20 (×4): 100 mg via ORAL
  Filled 2024-03-17 (×4): qty 1

## 2024-03-17 MED ORDER — ORAL CARE MOUTH RINSE: 15.0000 mL | OROMUCOSAL | Status: DC | PRN

## 2024-03-17 MED ORDER — ENSURE ENLIVE PO LIQD
237.0000 mL | Freq: Two times a day (BID) | ORAL | Status: DC
  Administered 2024-03-19 – 2024-03-20 (×2): 237 mL via ORAL

## 2024-03-17 MED ORDER — ASPIRIN 81 MG PO CHEW
81.0000 mg | CHEWABLE_TABLET | Freq: Every morning | ORAL | Status: DC
  Administered 2024-03-17 – 2024-03-20 (×4): 81 mg via ORAL
  Filled 2024-03-17 (×4): qty 1

## 2024-03-17 NOTE — Consult Note (Signed)
 Consultation Note Date: 03/17/2024   Patient Name: Kayla Hunt  DOB: Jan 06, 1951  MRN: [id number]  Age / Sex: 73 y.o., female  PCP: Marius Siemens, NP Referring Physician: Feliciana Horn, MD  Reason for Consultation: Establishing goals of care  HPI/Patient Profile: 73 y.o. female  with past medical history of hypertension, hyperlipidemia. Hypothyroidism, seizures, type 2 DM , chronic diastolic CHF, stage 4 CKD admitted on 03/13/2024 with confusion, slurred speech, weakness, right-sided facial droop.   In the ED, initial CT head without contrast and CT angio of the head and neck are negative for emergent large vessel occlusion.  MRI showed possible PRES.  Patient admitted now with acute metabolic encephalopathy, severe hypothyroidism.  Hypokalemia.  PMT has been consulted to assist with goals of care conversation.  Clinical Assessment and Goals of Care:  I have personally reviewed medical records including EPIC notes, labs and imaging, assessed the patient and then have phone conversation with patient's daughter Kayla Hunt to discuss diagnosis prognosis, GOC, EOL wishes, disposition and options.  I introduced Palliative Medicine as specialized medical care for people living with serious illness. It focuses on providing relief from the symptoms and stress of a serious illness. The goal is to improve quality of life for both the patient and the family.  We discussed a brief life review of the patient and then focused on their current illness.   I attempted to elicit values and goals of care important to the patient.    Medical History Review and Understanding:  We discussed patient's acute illness in the context of their chronic comorbidities.  Kayla Hunt was not entirely sure of what patient's diagnosis was, whether she had a stroke or heart attack.  Provided education on PRES, expectations for further improvement.  Social History: Patient lives with her  daughter and her 6 grandchildren.  Daughter provides 24/7 care.  She is interested if she could become patient's nurse.  She has cared for patient since around 2018.  She has also taken care of patient's mother and 2 brothers in the past.  Functional and Nutritional State: Patient is bedbound for several years.  Albumin of 2.5 on 5/14 noted.  Palliative Symptoms: Patient denies any symptoms today  Code Status: Concepts specific to code status, artifical feeding and hydration, and rehospitalization were considered and discussed.   Discussion: Patient defers conversation to her daughter Kayla Hunt and tells me she has never discussed her care preferences or her wishes with family in the past.  I encouraged her to consider open, honest conversations regarding anticipatory care needs.  I then called patient's daughter and we discussed patient's quality of life, prognosis, disease trajectory.  She is still awaiting medical equipment (wheelchair and [employer]) and feels that home health will meet their needs for the time being.  She plans to discuss patient's goals of care with her after they get settled with her back at home.  She has had extensive experience with this in the past with several other family members and she feels confident about holding conversations.  She understands the importance of GOC discussions and planning for both best case and worst-case scenarios.   Discussed the importance of continued conversation with family and the medical providers regarding overall plan of care and treatment options, ensuring decisions are within the context of the patient's values and GOCs.   Questions and concerns were addressed.  The family was encouraged to call with questions or concerns.  PMT will continue to support holistically.   SUMMARY  OF RECOMMENDATIONS   - Education was provided regarding acute illness, risks associated with patient's chronic disease burden, age and debility - Goals are  clear for discharge home with home health - Patient's daughter politely declines outpatient palliative care follow-up.  She prefers to speak with patient about goals of care independently and request palliative involvement in the future if interested - PMT remains available as needed   Prognosis:  Unable to determine  Discharge Planning: Home with Home Health      Primary Diagnoses: Present on Admission:  TIA (transient ischemic attack)    Physical Exam Vitals and nursing note reviewed.  Constitutional:      General: She is not in acute distress.    Appearance: She is ill-appearing.  Cardiovascular:     Rate and Rhythm: Normal rate.  Pulmonary:     Effort: Pulmonary effort is normal.  Neurological:     Mental Status: She is alert. She is confused.  Psychiatric:        Mood and Affect: Mood normal.        Behavior: Behavior normal.        Cognition and Memory: Cognition is impaired.     Vital Signs: BP 125/83 (BP Location: Right Arm)   Pulse 66   Temp 98.3 F (36.8 C) (Oral)   Resp 19   Ht 5\' 1"  (1.549 m)   Wt 66.5 kg   SpO2 96%   BMI 27.70 kg/m  Pain Scale: 0-10   Pain Score: 4    SpO2: SpO2: 96 % O2 Device:SpO2: 96 % O2 Flow Rate: .     MDM: High   Brytnee Bechler Alroy Jericho, PA-C  Palliative Medicine Team Team phone # [phone]  Thank you for allowing the Palliative Medicine Team to assist in the care of this patient. Please utilize secure chat with additional questions, if there is no response within 30 minutes please call the above phone number.  Palliative Medicine Team providers are available by phone from 7am to 7pm daily and can be reached through the team cell phone.  Should this patient require assistance outside of these hours, please call the patient's attending physician.

## 2024-03-17 NOTE — Progress Notes (Signed)
 NEUROLOGY CONSULT FOLLOW UP NOTE   Date of service: Mar 17, 2024 Patient Name: Kayla Hunt MRN:  [id number] DOB:  [birth year]  Interval Hx/subjective   Patient has been normotensive and afebrile overnight.  Her mental status is improved, as is her dysarthria.  Vitals   Vitals:   03/16/24 2159 03/17/24 0425 03/17/24 0539 03/17/24 0740  BP: 116/77 (!) 117/56 (!) 117/56 (!) 109/53  Pulse: 63 (!) 50  (!) 58  Resp:  18  17  Temp:  98.5 F (36.9 C)  97.7 F (36.5 C)  TempSrc:  Oral  Oral  SpO2:  95%  97%  Weight:      Height:         Body mass index is 27.7 kg/m.  Physical Exam   Constitutional: Chronically ill-appearing elderly patient in no acute distress Psych: Affect appropriate to situation.  Eyes: No scleral injection.  HENT: No OP obstrucion.  Head: Normocephalic.  Cardiovascular: Normal rate and regular rhythm.  Respiratory: Effort normal, non-labored breathing.  Skin: WDI.   Neurologic Examination     NEURO:  Mental Status: Alert and oriented to person and place, disoriented to time and situation.  Able to follow one step but requires repeated prompting for two step commands Speech/Language: speech is without dysarthria or aphasia. Speech is in short phrases Naming, repetition, and comprehension intact.  Cranial Nerves:  II: PERRL.  III, IV, VI: EOMI. Eyelids elevate symmetrically.  V: Sensation is intact to light touch and symmetrical to face.  VII: Smile is symmetrical.  VIII: hearing intact to voice. IX, X: Phonation is normal.  WU:[id number] shrug 5/5. XII: tongue is midline without fasciculations. Motor: Able to move all 4 extremities with good antigravity strength Tone: is normal and bulk is normal Sensation- Intact to light touch bilaterally.  Coordination: FTN intact bilaterally Gait- deferred    Medications  Current Facility-Administered Medications:    0.9 %  sodium chloride  infusion, , Intravenous, Continuous, Akula, Vijaya, MD, Last  Rate: 75 mL/hr at 03/13/24 2142, New Bag at 03/13/24 2142   acetaminophen  (TYLENOL ) tablet 650 mg, 650 mg, Oral, Q6H PRN **OR** acetaminophen  (TYLENOL ) suppository 650 mg, 650 mg, Rectal, Q6H PRN, Akula, Vijaya, MD   allopurinol  (ZYLOPRIM ) tablet 100 mg, 100 mg, Oral, q AM, Akula, Vijaya, MD   aspirin  chewable tablet 81 mg, 81 mg, Oral, q AM, Akula, Vijaya, MD   atorvastatin  (LIPITOR ) tablet 80 mg, 80 mg, Oral, Daily, Akula, Vijaya, MD, 80 mg at 03/16/24 1138   clopidogrel  (PLAVIX ) tablet 75 mg, 75 mg, Oral, Daily, Akula, Vijaya, MD, 75 mg at 03/16/24 1138   heparin  injection 5,000 Units, 5,000 Units, Subcutaneous, Q8H, Akula, Vijaya, MD, 5,000 Units at 03/17/24 0539   hydrALAZINE  (APRESOLINE ) injection 5 mg, 5 mg, Intravenous, Q8H PRN, Akula, Vijaya, MD   hydrALAZINE  (APRESOLINE ) tablet 50 mg, 50 mg, Oral, Q8H, Akula, Vijaya, MD, 50 mg at 03/17/24 0539   losartan  (COZAAR ) tablet 50 mg, 50 mg, Oral, Daily, 50 mg at 03/16/24 1138 **AND** hydrochlorothiazide  (HYDRODIURIL ) tablet 12.5 mg, 12.5 mg, Oral, Daily, Reome, Earle J, RPH, 12.5 mg at 03/15/24 0918   insulin  aspart (novoLOG ) injection 0-6 Units, 0-6 Units, Subcutaneous, TID WC, Akula, Vijaya, MD, 1 Units at 03/15/24 1656   levothyroxine  (SYNTHROID ) tablet 100 mcg, 100 mcg, Oral, Q0600, Akula, Vijaya, MD, 100 mcg at 03/17/24 0540   metoprolol  tartrate (LOPRESSOR ) tablet 50 mg, 50 mg, Oral, BID, Akula, Vijaya, MD, 50 mg at 03/16/24 2159   ondansetron  (ZOFRAN ) tablet 4 mg,  4 mg, Oral, Q6H PRN **OR** ondansetron  (ZOFRAN ) injection 4 mg, 4 mg, Intravenous, Q6H PRN, Akula, Vijaya, MD   Oral care mouth rinse, 15 mL, Mouth Rinse, PRN, Feliciana Horn, MD   potassium chloride  SA (KLOR-CON  M) CR tablet 40 mEq, 40 mEq, Oral, Once, Feliciana Horn, MD   sodium chloride  flush (NS) 0.9 % injection 3 mL, 3 mL, Intravenous, Once, Feliciana Horn, MD  Labs and Diagnostic Imaging   CBC:  Recent Labs  Lab 03/13/24 1132 03/13/24 1134 03/13/24 1420 03/14/24 0649   WBC 4.5  --   --  3.4*  NEUTROABS 2.1  --   --  1.0*  HGB 13.4   < > 11.9* 13.3  HCT 43.1   < > 35.0* 42.1  MCV 91.9  --   --  88.3  PLT 172  --   --  192   < > = values in this interval not displayed.    Basic Metabolic Panel:  Lab Results  Component Value Date   NA 138 03/15/2024   K 3.8 03/15/2024   CO2 26 03/15/2024   GLUCOSE 104 (H) 03/15/2024   BUN 22 03/15/2024   CREATININE 1.81 (H) 03/15/2024   CALCIUM  8.5 (L) 03/15/2024   GFRNONAA 30 (L) 03/15/2024   GFRAA 32 (L) 02/24/2021   Lipid Panel:  Lab Results  Component Value Date   LDLCALC 438 (H) 03/14/2024   HgbA1c:  Lab Results  Component Value Date   HGBA1C 6.3 (H) 03/14/2024   Alcohol Level     Component Value Date/Time   [facility] <15 03/13/2024 1132   INR  Lab Results  Component Value Date   INR 1.0 03/13/2024   APTT  Lab Results  Component Value Date   APTT 34 03/13/2024   TSH 96.872 T4 free 0.32 Free triiodothyronine <0.3  CT Head without contrast(Personally reviewed): Questionable hyperdense left MCA, chronic small vessel ischemic changes  CT angio Head and Neck with contrast(Personally reviewed): No emergent LVO, age-indeterminate left vertebral artery occlusion  MRI Brain(Personally reviewed): Chronic white matter changes, additional areas of signal abnormality in bilateral basal ganglia and cerebellar hemispheres with possible edema, possibly indicating PRES  rEEG:  ABNORMALITY - Intermittent slow, generalized   IMPRESSION: This study is suggestive of mild diffuse encephalopathy. No seizures or epileptiform discharges were seen throughout the recording.  Assessment   Kayla Hunt is a 73 y.o. 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  MRI reveals changes associated with PRES, although she has  not been very hypertensive.  On exam today, she is disoriented to time and situation but is able to name most objects and can repeat phrases in a dysarthric voice.  EEG demonstrated intermittent slowing but no seizure activity.  TSH is noted to be quite elevated, and hypothyroidism could certainly be contributing to her altered mental status. On today's exam, she continues to be encephalopathic but is improving.  Expect that she will continue to improve.   Impression: Posterior reversible encephalopathy syndrome Metabolic encephalopathy in the setting of severe hypothyroidism  Recommendations  -BP goal normotension - Treatment of hypothyroidism per primary team -Neurology will sign off, please call with questions or concerns ______________________________________________________________________ Patient seen by NP with MD, MD to edit note as needed.  Signed, Cortney E Bucky Cardinal, NP [facility]   Attending Neurohospitalist Addendum Patient seen and examined with  APP/Resident. Agree with the history and physical as documented above. Agree with the plan as documented, which I helped formulate. I have independently reviewed the chart, obtained history, review of systems and examined the patient.I have personally reviewed pertinent head/neck/spine imaging (CT/MRI). Please feel free to call with any questions.  -- Tona Francis, MD Neurologist [facility] Pager: [phone]

## 2024-03-17 NOTE — Plan of Care (Signed)
  Problem: Education: Goal: Knowledge of General Education information will improve Description: Including pain rating scale, medication(s)/side effects and non-pharmacologic comfort measures Outcome: Progressing   Problem: Health Behavior/Discharge Planning: Goal: Ability to manage health-related needs will improve Outcome: Progressing   Problem: Clinical Measurements: Goal: Ability to maintain clinical measurements within normal limits will improve Outcome: Progressing Goal: Will remain free from infection Outcome: Progressing Goal: Diagnostic test results will improve Outcome: Progressing Goal: Respiratory complications will improve Outcome: Progressing Goal: Cardiovascular complication will be avoided Outcome: Progressing   Problem: Nutrition: Goal: Adequate nutrition will be maintained Outcome: Progressing   Problem: Coping: Goal: Level of anxiety will decrease Outcome: Progressing   Problem: Elimination: Goal: Will not experience complications related to bowel motility Outcome: Progressing Goal: Will not experience complications related to urinary retention Outcome: Progressing   Problem: Pain Managment: Goal: General experience of comfort will improve and/or be controlled Outcome: Progressing   Problem: Safety: Goal: Ability to remain free from injury will improve Outcome: Progressing   Problem: Skin Integrity: Goal: Risk for impaired skin integrity will decrease Outcome: Progressing   Problem: Education: Goal: Knowledge of disease or condition will improve Outcome: Progressing Goal: Knowledge of secondary prevention will improve (MUST DOCUMENT ALL) Outcome: Progressing Goal: Knowledge of patient specific risk factors will improve (DELETE if not current risk factor) Outcome: Progressing   Problem: Ischemic Stroke/TIA Tissue Perfusion: Goal: Complications of ischemic stroke/TIA will be minimized Outcome: Progressing   Problem: Coping: Goal: Will  verbalize positive feelings about self Outcome: Progressing Goal: Will identify appropriate support needs Outcome: Progressing   Problem: Health Behavior/Discharge Planning: Goal: Ability to manage health-related needs will improve Outcome: Progressing Goal: Goals will be collaboratively established with patient/family Outcome: Progressing   Problem: Self-Care: Goal: Ability to participate in self-care as condition permits will improve Outcome: Progressing Goal: Verbalization of feelings and concerns over difficulty with self-care will improve Outcome: Progressing Goal: Ability to communicate needs accurately will improve Outcome: Progressing   Problem: Nutrition: Goal: Risk of aspiration will decrease Outcome: Progressing Goal: Dietary intake will improve Outcome: Progressing   Problem: Education: Goal: Ability to describe self-care measures that may prevent or decrease complications (Diabetes Survival Skills Education) will improve Outcome: Progressing Goal: Individualized Educational Video(s) Outcome: Progressing   Problem: Coping: Goal: Ability to adjust to condition or change in health will improve Outcome: Progressing   Problem: Fluid Volume: Goal: Ability to maintain a balanced intake and output will improve Outcome: Progressing   Problem: Health Behavior/Discharge Planning: Goal: Ability to identify and utilize available resources and services will improve Outcome: Progressing Goal: Ability to manage health-related needs will improve Outcome: Progressing   Problem: Metabolic: Goal: Ability to maintain appropriate glucose levels will improve Outcome: Progressing   Problem: Nutritional: Goal: Maintenance of adequate nutrition will improve Outcome: Progressing Goal: Progress toward achieving an optimal weight will improve Outcome: Progressing   Problem: Skin Integrity: Goal: Risk for impaired skin integrity will decrease Outcome: Progressing   Problem:  Tissue Perfusion: Goal: Adequacy of tissue perfusion will improve Outcome: Progressing

## 2024-03-17 NOTE — H&P (View-Only) (Signed)
 [facility]                                                                               Kayla Hunt, is a 73 y.o. female, DOB - 1950-12-21, AOZ:[id number] Admit date - 03/13/2024    Outpatient Primary MD for the patient is Kayla Siemens, NP  LOS - 3  days    Brief summary   Kayla Hunt is a 73 y.o. female with medical history significant of hypertension, hyperlipidemia. Hypothyroidism, seizures, type 2 DM , chronic diastolic CHF, stage 4 CKD, was brought in from home as a code stroke. As per the daughter, she has been confused, with slurred speech and word finding difficulty and has generalized weakness, with right sided facial droop and right sided weakness.  MRI brain is negative for acute stroke. Superimposed on the background of chronic white matter changes there are additional areas of signal abnormality within the right temporal and right occipital lobes as well as the bilateral basal ganglia and bilateral cerebellar hemispheres with possible edema. Findings reflect PRES in the appropriate clinical context. Neurology on board.    Assessment & Plan    Assessment and Plan:  Acute metabolic encephalopathy probably from PRES and severe hypothyroidism. Ammonia level is wnl. TSH is elevated.  Echocardiogram reviewed. Showed LVEF of 60 to 65%. Grade 1 diastolic dysfunction.   She was found to have a rounded density on the RCC best seen in the PLAX view , rule out mass.  Cardiology consulted for TEE. Aaron Aas No signs of infection. UA is negative. CXR No active disease.  LDL is 438. Added atorvastatin  80 mg daily.  MRI reviewed and discussed with the daughter over the phone.  SLP/PT/OT Eval.  TEE scheduled for Monday.  She is more alert and answering simple questions.  No new complaints.    Hypokalemia Replaced.  Repeat level wnl.    Hypothyroidism;  TSH around 95. Suspect from non compliance.  Free t4 and free t3 are  severely low.  Discussed with her  daughter, who confirmed that she might not be taking her meds.  Encourage oral intake.   Hyperlipidemia:  LDL is 438. Continue with lipitor  80 mg daily.    Stage 4 CKD Creatinine stable at 1.8.    H/o Seizures:  Neurology on board.  EEG done and showed  mild diffuse encephalopathy. No seizures or epileptiform discharges were seen throughout the recording.    Type 2 Diabetes Mellitus:  CBG (last 3)  Recent Labs    03/16/24 2114 03/17/24 0631 03/17/24 1220  GLUCAP 101* 117* 228*   A1c is 6.3. No change in meds.     Chronic diastolic heart failure:  Echo reviewed.  She appears wuvolemic.     Estimated body mass index is 27.7 kg/m as calculated from the following:   Height as of this encounter: 5\' 1"  (1.549 m).   Weight as of this encounter: 66.5 kg.  Code Status: full code.  DVT Prophylaxis:  heparin  injection 5,000 Units Start: 03/14/24 1445Heparin   Level of Care: Level of care: Telemetry Medical Family Communication: Updated patient's care giver/ daughter over the phone.   Disposition Plan:  Remains inpatient appropriate:  TEE In am.   Procedures:  EEG.  Echo.   Consultants:   Neurology.   Antimicrobials:   Anti-infectives (From admission, onward)    None        Medications  Scheduled Meds:  allopurinol   100 mg Oral q AM   aspirin   81 mg Oral q AM   atorvastatin   80 mg Oral Daily   clopidogrel   75 mg Oral Daily   feeding supplement  237 mL Oral BID BM   heparin  injection (subcutaneous)  5,000 Units Subcutaneous Q8H   hydrALAZINE   50 mg Oral Q8H   losartan   50 mg Oral Daily   And   hydrochlorothiazide   12.5 mg Oral Daily   insulin  aspart  0-6 Units Subcutaneous TID WC   levothyroxine   100 mcg Oral Q0600   metoprolol  tartrate  50 mg Oral BID   potassium chloride   40 mEq Oral Once   sodium chloride  flush  3 mL Intravenous Once   Continuous Infusions:  sodium chloride  75 mL/hr at 03/13/24 2142   [START ON 03/18/2024] sodium chloride       PRN Meds:.acetaminophen  **OR** acetaminophen , hydrALAZINE , ondansetron  **OR** ondansetron  (ZOFRAN ) IV, mouth rinse    Subjective:   Romilda Proby was seen and examined today. No new complaints.   Objective:   Vitals:   03/17/24 0425 03/17/24 0539 03/17/24 0740 03/17/24 1220  BP: (!) 117/56 (!) 117/56 (!) 109/53 125/83  Pulse: (!) 50  (!) 58 66  Resp: 18  17 19   Temp: 98.5 F (36.9 C)  97.7 F (36.5 C) 98.3 F (36.8 C)  TempSrc: Oral  Oral Oral  SpO2: 95%  97% 96%  Weight:      Height:        Intake/Output Summary (Last 24 hours) at 03/17/2024 1608 Last data filed at 03/17/2024 0800 Gross per 24 hour  Intake --  Output 400 ml  Net -400 ml   Filed Weights   03/15/24 0900  Weight: 66.5 kg     Exam General exam: Appears calm and comfortable  Respiratory system: Clear to auscultation. Respiratory effort normal. Cardiovascular system: S1 & S2 heard, RRR. No JVD,  Gastrointestinal system: Abdomen is nondistended, soft and nontender.  Central nervous system: Alert and oriented.  Extremities: Symmetric 5 x 5 power. Skin: No rashes, Psychiatry:  Mood & affect appropriate.      Data Reviewed:  I have personally reviewed following labs and imaging studies   CBC Lab Results  Component Value Date   WBC 3.4 (L) 03/14/2024   RBC 4.77 03/14/2024   HGB 13.3 03/14/2024   HCT 42.1 03/14/2024   MCV 88.3 03/14/2024   MCH 27.9 03/14/2024   PLT 192 03/14/2024   MCHC 31.6 03/14/2024   RDW 15.6 (H) 03/14/2024   LYMPHSABS 2.0 03/14/2024   MONOABS 0.3 03/14/2024   EOSABS 0.1 03/14/2024   BASOSABS 0.0 03/14/2024     Last metabolic panel Lab Results  Component Value Date   NA 138 03/15/2024   K 3.8 03/15/2024   CL 106 03/15/2024   CO2 26 03/15/2024   BUN 22 03/15/2024   CREATININE 1.81 (H) 03/15/2024   GLUCOSE 104 (H) 03/15/2024   GFRNONAA 30 (L) 03/15/2024   GFRAA 32 (L) 02/24/2021   CALCIUM  8.5 (L) 03/15/2024   PHOS 3.2 05/11/2023   PROT 6.5 03/13/2024    ALBUMIN 2.5 (L) 03/13/2024   LABGLOB 3.2 07/15/2019   AGRATIO 1.1 (L) 07/15/2019   BILITOT 0.5 03/13/2024  ALKPHOS 58 03/13/2024   AST 22 03/13/2024   ALT 11 03/13/2024   ANIONGAP 6 03/15/2024    CBG (last 3)  Recent Labs    03/16/24 2114 03/17/24 0631 03/17/24 1220  GLUCAP 101* 117* 228*      Coagulation Profile: Recent Labs  Lab 03/13/24 1132  INR 1.0     Radiology Studies: No results found.      Feliciana Horn M.D. [facility] 03/17/2024, 4:08 PM  Available via Epic secure chat 7am-7pm After 7 pm, please refer to night coverage provider listed on amion.

## 2024-03-17 NOTE — Progress Notes (Signed)
 [facility]                                                                               Kayla Hunt, is a 73 y.o. female, DOB - 1950-12-21, AOZ:[id number] Admit date - 03/13/2024    Outpatient Primary MD for the patient is Marius Siemens, NP  LOS - 3  days    Brief summary   Kayla Hunt is a 73 y.o. female with medical history significant of hypertension, hyperlipidemia. Hypothyroidism, seizures, type 2 DM , chronic diastolic CHF, stage 4 CKD, was brought in from home as a code stroke. As per the daughter, she has been confused, with slurred speech and word finding difficulty and has generalized weakness, with right sided facial droop and right sided weakness.  MRI brain is negative for acute stroke. Superimposed on the background of chronic white matter changes there are additional areas of signal abnormality within the right temporal and right occipital lobes as well as the bilateral basal ganglia and bilateral cerebellar hemispheres with possible edema. Findings reflect PRES in the appropriate clinical context. Neurology on board.    Assessment & Plan    Assessment and Plan:  Acute metabolic encephalopathy probably from PRES and severe hypothyroidism. Ammonia level is wnl. TSH is elevated.  Echocardiogram reviewed. Showed LVEF of 60 to 65%. Grade 1 diastolic dysfunction.   She was found to have a rounded density on the RCC best seen in the PLAX view , rule out mass.  Cardiology consulted for TEE. Aaron Aas No signs of infection. UA is negative. CXR No active disease.  LDL is 438. Added atorvastatin  80 mg daily.  MRI reviewed and discussed with the daughter over the phone.  SLP/PT/OT Eval.  TEE scheduled for Monday.  She is more alert and answering simple questions.  No new complaints.    Hypokalemia Replaced.  Repeat level wnl.    Hypothyroidism;  TSH around 95. Suspect from non compliance.  Free t4 and free t3 are  severely low.  Discussed with her  daughter, who confirmed that she might not be taking her meds.  Encourage oral intake.   Hyperlipidemia:  LDL is 438. Continue with lipitor  80 mg daily.    Stage 4 CKD Creatinine stable at 1.8.    H/o Seizures:  Neurology on board.  EEG done and showed  mild diffuse encephalopathy. No seizures or epileptiform discharges were seen throughout the recording.    Type 2 Diabetes Mellitus:  CBG (last 3)  Recent Labs    03/16/24 2114 03/17/24 0631 03/17/24 1220  GLUCAP 101* 117* 228*   A1c is 6.3. No change in meds.     Chronic diastolic heart failure:  Echo reviewed.  She appears wuvolemic.     Estimated body mass index is 27.7 kg/m as calculated from the following:   Height as of this encounter: 5\' 1"  (1.549 m).   Weight as of this encounter: 66.5 kg.  Code Status: full code.  DVT Prophylaxis:  heparin  injection 5,000 Units Start: 03/14/24 1445Heparin   Level of Care: Level of care: Telemetry Medical Family Communication: Updated patient's care giver/ daughter over the phone.   Disposition Plan:  Remains inpatient appropriate:  TEE In am.   Procedures:  EEG.  Echo.   Consultants:   Neurology.   Antimicrobials:   Anti-infectives (From admission, onward)    None        Medications  Scheduled Meds:  allopurinol   100 mg Oral q AM   aspirin   81 mg Oral q AM   atorvastatin   80 mg Oral Daily   clopidogrel   75 mg Oral Daily   feeding supplement  237 mL Oral BID BM   heparin  injection (subcutaneous)  5,000 Units Subcutaneous Q8H   hydrALAZINE   50 mg Oral Q8H   losartan   50 mg Oral Daily   And   hydrochlorothiazide   12.5 mg Oral Daily   insulin  aspart  0-6 Units Subcutaneous TID WC   levothyroxine   100 mcg Oral Q0600   metoprolol  tartrate  50 mg Oral BID   potassium chloride   40 mEq Oral Once   sodium chloride  flush  3 mL Intravenous Once   Continuous Infusions:  sodium chloride  75 mL/hr at 03/13/24 2142   [START ON 03/18/2024] sodium chloride       PRN Meds:.acetaminophen  **OR** acetaminophen , hydrALAZINE , ondansetron  **OR** ondansetron  (ZOFRAN ) IV, mouth rinse    Subjective:   Kayla Hunt was seen and examined today. No new complaints.   Objective:   Vitals:   03/17/24 0425 03/17/24 0539 03/17/24 0740 03/17/24 1220  BP: (!) 117/56 (!) 117/56 (!) 109/53 125/83  Pulse: (!) 50  (!) 58 66  Resp: 18  17 19   Temp: 98.5 F (36.9 C)  97.7 F (36.5 C) 98.3 F (36.8 C)  TempSrc: Oral  Oral Oral  SpO2: 95%  97% 96%  Weight:      Height:        Intake/Output Summary (Last 24 hours) at 03/17/2024 1608 Last data filed at 03/17/2024 0800 Gross per 24 hour  Intake --  Output 400 ml  Net -400 ml   Filed Weights   03/15/24 0900  Weight: 66.5 kg     Exam General exam: Appears calm and comfortable  Respiratory system: Clear to auscultation. Respiratory effort normal. Cardiovascular system: S1 & S2 heard, RRR. No JVD,  Gastrointestinal system: Abdomen is nondistended, soft and nontender.  Central nervous system: Alert and oriented.  Extremities: Symmetric 5 x 5 power. Skin: No rashes, Psychiatry:  Mood & affect appropriate.      Data Reviewed:  I have personally reviewed following labs and imaging studies   CBC Lab Results  Component Value Date   WBC 3.4 (L) 03/14/2024   RBC 4.77 03/14/2024   HGB 13.3 03/14/2024   HCT 42.1 03/14/2024   MCV 88.3 03/14/2024   MCH 27.9 03/14/2024   PLT 192 03/14/2024   MCHC 31.6 03/14/2024   RDW 15.6 (H) 03/14/2024   LYMPHSABS 2.0 03/14/2024   MONOABS 0.3 03/14/2024   EOSABS 0.1 03/14/2024   BASOSABS 0.0 03/14/2024     Last metabolic panel Lab Results  Component Value Date   NA 138 03/15/2024   K 3.8 03/15/2024   CL 106 03/15/2024   CO2 26 03/15/2024   BUN 22 03/15/2024   CREATININE 1.81 (H) 03/15/2024   GLUCOSE 104 (H) 03/15/2024   GFRNONAA 30 (L) 03/15/2024   GFRAA 32 (L) 02/24/2021   CALCIUM  8.5 (L) 03/15/2024   PHOS 3.2 05/11/2023   PROT 6.5 03/13/2024    ALBUMIN 2.5 (L) 03/13/2024   LABGLOB 3.2 07/15/2019   AGRATIO 1.1 (L) 07/15/2019   BILITOT 0.5 03/13/2024  ALKPHOS 58 03/13/2024   AST 22 03/13/2024   ALT 11 03/13/2024   ANIONGAP 6 03/15/2024    CBG (last 3)  Recent Labs    03/16/24 2114 03/17/24 0631 03/17/24 1220  GLUCAP 101* 117* 228*      Coagulation Profile: Recent Labs  Lab 03/13/24 1132  INR 1.0     Radiology Studies: No results found.      Feliciana Horn M.D. [facility] 03/17/2024, 4:08 PM  Available via Epic secure chat 7am-7pm After 7 pm, please refer to night coverage provider listed on amion.

## 2024-03-17 NOTE — Progress Notes (Addendum)
   [facility] has been requested to perform a transesophageal echocardiogram on Kayla Hunt for CVA.     The patient does NOT have any absolute or relative contraindications to a Transesophageal Echocardiogram (TEE).  The patient has: No other conditions that may impact this procedure.  Pt w/ some confusion, requested I speak w/ her daughter. Nursing staff concurs that her daughter is better able to consent.  Spoke w/ daughter, [name], by phone: After careful review of history and examination, the risks and benefits of transesophageal echocardiogram have been explained including risks of esophageal damage, perforation (1:10,000 risk), bleeding, pharyngeal hematoma as well as other potential complications associated with conscious sedation including aspiration, arrhythmia, respiratory failure and death. Alternatives to treatment were discussed, questions were answered.   Patient's daughter gave permission (witnessed by another staff member) by phone, and is willing to proceed.   SignedArmandina Bernard, PA-C  03/17/2024 8:05 AM

## 2024-03-18 ENCOUNTER — Inpatient Hospital Stay (HOSPITAL_COMMUNITY)

## 2024-03-18 ENCOUNTER — Encounter (HOSPITAL_COMMUNITY): Payer: Self-pay | Admitting: Cardiovascular Disease

## 2024-03-18 ENCOUNTER — Encounter (HOSPITAL_COMMUNITY)
Admission: EM | Disposition: A | Discharge status: Home Health | Payer: Self-pay | Source: Home / Self Care | Attending: Internal Medicine

## 2024-03-18 DIAGNOSIS — I10 Essential (primary) hypertension: Secondary | ICD-10-CM | POA: Diagnosis not present

## 2024-03-18 DIAGNOSIS — I358 Other nonrheumatic aortic valve disorders: Secondary | ICD-10-CM

## 2024-03-18 DIAGNOSIS — I509 Heart failure, unspecified: Secondary | ICD-10-CM | POA: Diagnosis not present

## 2024-03-18 DIAGNOSIS — I6783 Posterior reversible encephalopathy syndrome: Secondary | ICD-10-CM | POA: Diagnosis not present

## 2024-03-18 DIAGNOSIS — I517 Cardiomegaly: Secondary | ICD-10-CM

## 2024-03-18 DIAGNOSIS — E785 Hyperlipidemia, unspecified: Secondary | ICD-10-CM | POA: Diagnosis not present

## 2024-03-18 DIAGNOSIS — G459 Transient cerebral ischemic attack, unspecified: Secondary | ICD-10-CM | POA: Diagnosis not present

## 2024-03-18 DIAGNOSIS — Z7189 Other specified counseling: Secondary | ICD-10-CM | POA: Diagnosis not present

## 2024-03-18 DIAGNOSIS — N183 Chronic kidney disease, stage 3 unspecified: Secondary | ICD-10-CM | POA: Diagnosis not present

## 2024-03-18 DIAGNOSIS — I351 Nonrheumatic aortic (valve) insufficiency: Secondary | ICD-10-CM

## 2024-03-18 DIAGNOSIS — R2990 Unspecified symptoms and signs involving the nervous system: Secondary | ICD-10-CM | POA: Diagnosis not present

## 2024-03-18 HISTORY — PX: TRANSESOPHAGEAL ECHOCARDIOGRAM (CATH LAB): EP1270

## 2024-03-18 LAB — GLUCOSE, CAPILLARY
Glucose-Capillary: 83 mg/dL (ref 70–99)
Glucose-Capillary: 85 mg/dL (ref 70–99)
Glucose-Capillary: 79 mg/dL (ref 70–99)
Glucose-Capillary: 74 mg/dL (ref 70–99)

## 2024-03-18 LAB — ECHO TEE

## 2024-03-18 LAB — VITAMIN B6: Vitamin B6: 2.9 ug/L — ABNORMAL LOW (ref 3.4–65.2)

## 2024-03-18 SURGERY — TRANSESOPHAGEAL ECHOCARDIOGRAM (TEE) (CATHLAB)
Anesthesia: Monitor Anesthesia Care

## 2024-03-18 MED ORDER — PROPOFOL 500 MG/50ML IV EMUL
INTRAVENOUS | Status: DC | PRN
  Administered 2024-03-18: 80 ug/kg/min via INTRAVENOUS

## 2024-03-18 MED ORDER — HALOPERIDOL LACTATE 5 MG/ML IJ SOLN
2.0000 mg | Freq: Four times a day (QID) | INTRAMUSCULAR | Status: DC | PRN
  Administered 2024-03-18 – 2024-03-19 (×2): 2 mg via INTRAVENOUS
  Filled 2024-03-18 (×2): qty 1

## 2024-03-18 NOTE — Care Management Important Message (Signed)
 Important Message  Patient Details  Name: Kayla Hunt MRN: [id number] Date of Birth: [birth year]   Important Message Given:  Yes - Medicare IM     Wynonia Hedges 03/18/2024, 3:36 PM

## 2024-03-18 NOTE — Anesthesia Postprocedure Evaluation (Signed)
 Anesthesia Post Note  Patient: Kayla Hunt  Procedure(s) Performed: TRANSESOPHAGEAL ECHOCARDIOGRAM     Patient location during evaluation: PACU Anesthesia Type: MAC Level of consciousness: awake and alert Pain management: pain level controlled Vital Signs Assessment: post-procedure vital signs reviewed and stable Respiratory status: spontaneous breathing, nonlabored ventilation and respiratory function stable Cardiovascular status: blood pressure returned to baseline and stable Postop Assessment: no apparent nausea or vomiting Anesthetic complications: no   No notable events documented.  Last Vitals:  Vitals:   03/18/24 1254 03/18/24 1325  BP: 132/81 (!) 128/98  Pulse: 78   Resp: 20 20  Temp:  36.9 C  SpO2: 95% 94%    Last Pain:  Vitals:   03/18/24 1325  TempSrc: Axillary  PainSc:                  Earvin Goldberg

## 2024-03-18 NOTE — Progress Notes (Addendum)
 Pt was agitated. Pt did not let the nurse touched her. Verbalized " I'll punch your face" to the nurse. 2 mg iv haldol  administered.   Oral Billings, RN

## 2024-03-18 NOTE — Transfer of Care (Signed)
 Immediate Anesthesia Transfer of Care Note  Patient: Kayla Hunt  Procedure(s) Performed: TRANSESOPHAGEAL ECHOCARDIOGRAM  Patient Location: Cath Lab  Anesthesia Type:MAC  Level of Consciousness: awake, alert , and oriented  Airway & Oxygen Therapy: Patient Spontanous Breathing and Patient connected to nasal cannula oxygen  Post-op Assessment: Report given to RN and Post -op Vital signs reviewed and stable  Post vital signs: Reviewed and stable  Last Vitals:  Vitals Value Taken Time  BP 94/40 03/18/24 1234  Temp 36.5 C 03/18/24 1234  Pulse 77 03/18/24 1235  Resp 20 03/18/24 1235  SpO2 93 % 03/18/24 1235  Vitals shown include unfiled device data.  Last Pain:  Vitals:   03/18/24 1234  TempSrc: Tympanic  PainSc: Asleep         Complications: No notable events documented.

## 2024-03-18 NOTE — Interval H&P Note (Signed)
 History and Physical Interval Note:  03/18/2024 11:58 AM  Kayla Hunt  has presented today for surgery, with the diagnosis of rounded density on the RCC, rule out mass..  The various methods of treatment have been discussed with the patient and family. After consideration of risks, benefits and other options for treatment, the patient has consented to  Procedure(s): TRANSESOPHAGEAL ECHOCARDIOGRAM (N/A) as a surgical intervention.  The patient's history has been reviewed, patient examined, no change in status, stable for surgery.  I have reviewed the patient's chart and labs.  Questions were answered to the patient's satisfaction.     Kayla Hunt

## 2024-03-18 NOTE — TOC Progression Note (Addendum)
 Transition of Care [facility]) - Progression Note    Patient Details  Name: Kayla Hunt MRN: [id number] Date of Birth: Jun 27, 1951  Transition of Care [facility]) CM/SW Contact  Jannine Meo, RN Phone Number: 03/18/2024, 10:48 AM  Clinical Narrative:   Phone call attempt made to daughter to check on DME equipment delivery, voicemail left.  1100 Daughter returned call, reports DME did not get delivered as promised on Saturday. Phone call to [facility] with [employer] to check on delivery status. He will reach out to team to see what is going on with the delivery.  1135: DME to be delivered today per Mitch with [employer]. HH PT//OT arranged through [facility] with [facility], pt last used them in 2024. Contact information placed on AVS.  1550: Patient will not be discharged today per provider. Patient will need [facility] transport at discharge.    Expected Discharge Plan: Home w Home Health Services Barriers to Discharge: Continued Medical Work up  Expected Discharge Plan and Services     Post Acute Care Choice: Home Health Living arrangements for the past 2 months: Single Family Home                 DME Arranged: Community education officer wheelchair with seat cushion, Other see comment Alen Amy lift) DME Agency: [employer] Date DME Agency Contacted: 03/15/24   Representative spoke with at DME Agency: Coral Der             Social Determinants of Health (SDOH) Interventions SDOH Screenings   Housing: Patient Unable To Answer (03/15/2024)  Transportation Needs: Patient Unable To Answer (03/15/2024)  Utilities: Patient Unable To Answer (03/15/2024)  Depression (PHQ2-9): Low Risk  (01/27/2021)  Social Connections: Patient Unable To Answer (03/15/2024)  Tobacco Use: High Risk (01/27/2021)    Readmission Risk Interventions     No data to display

## 2024-03-18 NOTE — Progress Notes (Signed)
 [facility]                                                                               Kayla Hunt, is a 73 y.o. female, DOB - 06/04/1951, VOZ:[id number] Admit date - 03/13/2024    Outpatient Primary MD for the patient is Kayla Siemens, NP  LOS - 4  days    Brief summary   Kayla Hunt is a 72 y.o. female with medical history significant of hypertension, hyperlipidemia. Hypothyroidism, seizures, type 2 DM , chronic diastolic CHF, stage 4 CKD, was brought in from home as a code stroke. As per the daughter, she has been confused, with slurred speech and word finding difficulty and has generalized weakness, with right sided facial droop and right sided weakness.  MRI brain is negative for acute stroke. Superimposed on the background of chronic white matter changes there are additional areas of signal abnormality within the right temporal and right occipital lobes as well as the bilateral basal ganglia and bilateral cerebellar hemispheres with possible edema. Findings reflect PRES in the appropriate clinical context. Neurology on board.    Assessment & Plan    Assessment and Plan:  Acute metabolic encephalopathy probably from PRES and severe hypothyroidism. Ammonia level is wnl. TSH is elevated.  Echocardiogram reviewed. Showed LVEF of 60 to 65%. Grade 1 diastolic dysfunction.   She was found to have a rounded density on the RCC best seen in the PLAX view , rule out mass.  Cardiology consulted for TEE. She underwent TEE showing moderate PFO and increased risk of embolic stroke. . No signs of infection. UA is negative. CXR No active disease.  LDL is 438. Added atorvastatin  80 mg daily.  MRI reviewed and discussed with the daughter over the phone.  SLP/PT/OT Eval.  TEE scheduled for Monday.  Patient is more agitated and confused today. Continue with aspirin  and plavix  for now.    Hypokalemia Replaced.  Repeat level wnl.    Hypothyroidism;  TSH around 95.  Suspect from non compliance.  Free t4 and free t3 are  severely low.  Discussed with her daughter, who confirmed that she might not be taking her meds.  Encourage oral intake.   Hyperlipidemia:  LDL is 438. Continue with lipitor  80 mg daily.    Stage 4 CKD Creatinine stable at 1.8.    H/o Seizures:  Neurology on board.  EEG done and showed  mild diffuse encephalopathy. No seizures or epileptiform discharges were seen throughout the recording.    Type 2 Diabetes Mellitus:  CBG (last 3)  Recent Labs    03/17/24 2136 03/18/24 0621 03/18/24 1343  GLUCAP 123* 83 85   A1c is 6.3. No change in meds.     Chronic diastolic heart failure:  Echo reviewed.  She appears wuvolemic.     Estimated body mass index is 27.7 kg/m as calculated from the following:   Height as of this encounter: 5\' 1"  (1.549 m).   Weight as of this encounter: 66.5 kg.  Code Status: full code.  DVT Prophylaxis:  heparin  injection 5,000 Units Start: 03/14/24 1445Heparin   Level of Care: Level of care: Telemetry Medical Family  Communication: Updated patient's care giver/ daughter over the phone.   Disposition Plan:     Remains inpatient appropriate:  clinical improvement, possible d.c home tomorrow.   Procedures:  EEG.  Echo.   Consultants:   Neurology.  Cardiology for tee.   Antimicrobials:   Anti-infectives (From admission, onward)    None        Medications  Scheduled Meds:  allopurinol   100 mg Oral q AM   aspirin   81 mg Oral q AM   atorvastatin   80 mg Oral Daily   clopidogrel   75 mg Oral Daily   feeding supplement  237 mL Oral BID BM   heparin  injection (subcutaneous)  5,000 Units Subcutaneous Q8H   hydrALAZINE   50 mg Oral Q8H   losartan   50 mg Oral Daily   And   hydrochlorothiazide   12.5 mg Oral Daily   insulin  aspart  0-6 Units Subcutaneous TID WC   levothyroxine   100 mcg Oral Q0600   metoprolol  tartrate  50 mg Oral BID   potassium chloride   40 mEq Oral Once   sodium  chloride flush  3 mL Intravenous Once   Continuous Infusions:  sodium chloride  Stopped (03/18/24 1257)   PRN Meds:.acetaminophen  **OR** acetaminophen , haloperidol  lactate, hydrALAZINE , ondansetron  **OR** ondansetron  (ZOFRAN ) IV, mouth rinse    Subjective:   Kayla Hunt was seen and examined today. No new complaints.   Objective:   Vitals:   03/18/24 1234 03/18/24 1244 03/18/24 1254 03/18/24 1325  BP: (!) 94/40 (!) 140/85 132/81 (!) 128/98  Pulse: 75 78 78   Resp: 11 20 20 20   Temp: 97.7 F (36.5 C)   98.4 F (36.9 C)  TempSrc: Tympanic   Axillary  SpO2: 92% 96% 95% 94%  Weight:      Height:        Intake/Output Summary (Last 24 hours) at 03/18/2024 1453 Last data filed at 03/18/2024 1235 Gross per 24 hour  Intake --  Output 0 ml  Net 0 ml   Filed Weights   03/15/24 0900  Weight: 66.5 kg     Exam General exam: Ill appearing lady not in distress.  Respiratory system: Clear to auscultation. Respiratory effort normal. Cardiovascular system: S1 & S2 heard, RRR.  Gastrointestinal system: Abdomen is nondistended, soft and nontender.  Central nervous system: Alert and oriented to person only. Confused.  Extremities: warm extremities.  Skin: No rashes,  Psychiatry: confused and agitated.       Data Reviewed:  I have personally reviewed following labs and imaging studies   CBC Lab Results  Component Value Date   WBC 3.4 (L) 03/14/2024   RBC 4.77 03/14/2024   HGB 13.3 03/14/2024   HCT 42.1 03/14/2024   MCV 88.3 03/14/2024   MCH 27.9 03/14/2024   PLT 192 03/14/2024   MCHC 31.6 03/14/2024   RDW 15.6 (H) 03/14/2024   LYMPHSABS 2.0 03/14/2024   MONOABS 0.3 03/14/2024   EOSABS 0.1 03/14/2024   BASOSABS 0.0 03/14/2024     Last metabolic panel Lab Results  Component Value Date   NA 138 03/15/2024   K 3.8 03/15/2024   CL 106 03/15/2024   CO2 26 03/15/2024   BUN 22 03/15/2024   CREATININE 1.81 (H) 03/15/2024   GLUCOSE 104 (H) 03/15/2024   GFRNONAA 30  (L) 03/15/2024   GFRAA 32 (L) 02/24/2021   CALCIUM  8.5 (L) 03/15/2024   PHOS 3.2 05/11/2023   PROT 6.5 03/13/2024   ALBUMIN 2.5 (L) 03/13/2024   LABGLOB 3.2 07/15/2019  AGRATIO 1.1 (L) 07/15/2019   BILITOT 0.5 03/13/2024   ALKPHOS 58 03/13/2024   AST 22 03/13/2024   ALT 11 03/13/2024   ANIONGAP 6 03/15/2024    CBG (last 3)  Recent Labs    03/17/24 2136 03/18/24 0621 03/18/24 1343  GLUCAP 123* 83 85      Coagulation Profile: Recent Labs  Lab 03/13/24 1132  INR 1.0     Radiology Studies: ECHO TEE Result Date: 03/18/2024    TRANSESOPHOGEAL ECHO REPORT   Patient Name:   Kayla Hunt Date of Exam: 03/18/2024 Medical Rec #:  [id number]      Height:       61.0 in Accession #:    [id number]     Weight:       146.6 lb Date of Birth:  08-21-1951     BSA:          1.655 m Patient Age:    72 years       BP:           161/117 mmHg Patient Gender: F              HR:           84 bpm. Exam Location:  Inpatient Procedure: Transesophageal Echo, Cardiac Doppler, Color Doppler and Saline            Contrast Bubble Study (Both Spectral and Color Flow Doppler were            utilized during procedure). Indications:     Cerebral Infarction, unspecified I63.9  History:         Patient has prior history of Echocardiogram examinations, most                  recent 03/14/2024. TIA and CKD, stage 3; Risk                  Factors:Hypertension, Diabetes and Dyslipidemia.  Sonographer:     Terrilee Few RCS Referring Phys:  [id number] RHONDA G BARRETT Diagnosing Phys: Luana Rumple MD PROCEDURE: After discussion of the risks and benefits of a TEE, an informed consent was obtained from the patient. The transesophogeal probe was passed without difficulty through the esophogus of the patient. Imaged were obtained with the patient in a left lateral decubitus position. Sedation performed by different physician. The patient was monitored while under deep sedation. Anesthestetic sedation was provided intravenously by  Anesthesiology: 79.8mg  of Propofol , 0mg  of Lidocaine. The patient developed no complications during the procedure.  IMPRESSIONS  1. Left ventricular ejection fraction, by estimation, is 60 to 65%. The left ventricle has normal function. The left ventricle has no regional wall motion abnormalities. There is mild concentric left ventricular hypertrophy.  2. Right ventricular systolic function is normal. The right ventricular size is normal. Tricuspid regurgitation signal is inadequate for assessing PA pressure.  3. No left atrial/left atrial appendage thrombus was detected. The LAA emptying velocity was 50 cm/s.  4. A small pericardial effusion is present. There is no evidence of cardiac tamponade.  5. The mitral valve is normal in structure. No evidence of mitral valve regurgitation. No evidence of mitral stenosis.  6. The aortic valve is tricuspid. There is mild calcification of the aortic valve. There is mild thickening of the aortic valve. Aortic valve regurgitation is mild to moderate. Aortic valve sclerosis/calcification is present, without any evidence of aortic stenosis.  7. There is Moderate (Grade III) protruding plaque involving the aortic arch and descending  aorta.  8. Evidence of atrial level shunting detected by color flow Doppler. There is a moderately sized patent foramen ovale with bidirectional shunting across atrial septum. The atrial septum is aneurysmal and there is prompt right to left shunting during quiet breathing. FINDINGS  Left Ventricle: Left ventricular ejection fraction, by estimation, is 60 to 65%. The left ventricle has normal function. The left ventricle has no regional wall motion abnormalities. The left ventricular internal cavity size was normal in size. There is  mild concentric left ventricular hypertrophy. Right Ventricle: The right ventricular size is normal. No increase in right ventricular wall thickness. Right ventricular systolic function is normal. Tricuspid regurgitation  signal is inadequate for assessing PA pressure. Left Atrium: Left atrial size was normal in size. No left atrial/left atrial appendage thrombus was detected. The LAA emptying velocity was 50 cm/s. Right Atrium: Right atrial size was normal in size. Pericardium: A small pericardial effusion is present. There is no evidence of cardiac tamponade. Mitral Valve: The mitral valve is normal in structure. No evidence of mitral valve regurgitation. No evidence of mitral valve stenosis. Tricuspid Valve: The tricuspid valve is normal in structure. Tricuspid valve regurgitation is not demonstrated. Aortic Valve: No discrete mass is seen on the aortic valve leaflets. The aortic valve is tricuspid. There is mild calcification of the aortic valve. There is mild thickening of the aortic valve. Aortic valve regurgitation is mild to moderate. Aortic valve sclerosis/calcification is present, without any evidence of aortic stenosis. Pulmonic Valve: The pulmonic valve was grossly normal. Pulmonic valve regurgitation is not visualized. Aorta: The aortic root, ascending aorta, aortic arch and descending aorta are all structurally normal, with no evidence of dilitation or obstruction. There is moderate (Grade III) protruding plaque involving the aortic arch and descending aorta. IAS/Shunts: The interatrial septum is aneurysmal. Evidence of atrial level shunting detected by color flow Doppler. Agitated saline contrast was given intravenously to evaluate for intracardiac shunting. A moderately sized patent foramen ovale is detected with bidirectional shunting across atrial septum. Additional Comments: Spectral Doppler performed. Luana Rumple MD Electronically signed by Luana Rumple MD Signature Date/Time: 03/18/2024/2:51:41 PM    Final    EP STUDY Result Date: 03/18/2024 See surgical note for result.       Feliciana Horn M.D. [facility] 03/18/2024, 2:53 PM  Available via Epic secure chat 7am-7pm After 7 pm, please refer  to night coverage provider listed on amion.

## 2024-03-18 NOTE — Progress Notes (Signed)
 PT Cancellation Note  Patient Details Name: Kayla Hunt MRN: [id number] DOB: 07/24/1951   Cancelled Treatment:    Reason Eval/Treat Not Completed: Medical issues which prohibited therapy; checked on pt after return from TEE and RN reports pt restless and agitated.  Will follow up another day.   Marley Simmers 03/18/2024, 4:15 PM Abigail Hoff, PT Acute Rehabilitation Services Office:[phone] 03/18/2024

## 2024-03-18 NOTE — Anesthesia Preprocedure Evaluation (Signed)
 Anesthesia Evaluation  Patient identified by MRN, date of birth, ID band Patient awake    Reviewed: Allergy & Precautions, H&P , NPO status , Patient's Chart, lab work & pertinent test results  Airway Mallampati: II  TM Distance: >3 FB Neck ROM: Full    Dental no notable dental hx.    Pulmonary neg pulmonary ROS   Pulmonary exam normal breath sounds clear to auscultation       Cardiovascular hypertension, +CHF  Normal cardiovascular exam Rhythm:Regular Rate:Normal     Neuro/Psych   Anxiety Depression   Dementia TIA negative psych ROS   GI/Hepatic negative GI ROS, Neg liver ROS,,,  Endo/Other  diabetesHypothyroidism    Renal/GU Renal InsufficiencyRenal disease  negative genitourinary   Musculoskeletal negative musculoskeletal ROS (+)    Abdominal   Peds negative pediatric ROS (+)  Hematology negative hematology ROS (+)   Anesthesia Other Findings   Reproductive/Obstetrics negative OB ROS                             Anesthesia Physical Anesthesia Plan  ASA: 3  Anesthesia Plan: MAC   Post-op Pain Management: Minimal or no pain anticipated   Induction: Intravenous  PONV Risk Score and Plan: 2 and Propofol  infusion and Treatment may vary due to age or medical condition  Airway Management Planned:   Additional Equipment:   Intra-op Plan:   Post-operative Plan:   Informed Consent: I have reviewed the patients History and Physical, chart, labs and discussed the procedure including the risks, benefits and alternatives for the proposed anesthesia with the patient or authorized representative who has indicated his/her understanding and acceptance.     Dental advisory given  Plan Discussed with: CRNA  Anesthesia Plan Comments:        Anesthesia Quick Evaluation

## 2024-03-18 NOTE — Op Note (Signed)
 INDICATIONS: Aortic valve mass  PROCEDURE:   Informed consent was obtained prior to the procedure. The risks, benefits and alternatives for the procedure were discussed and the patient comprehended these risks.  Risks include, but are not limited to, cough, sore throat, vomiting, nausea, somnolence, esophageal and stomach trauma or perforation, bleeding, low blood pressure, aspiration, pneumonia, infection, trauma to the teeth and death.    After a procedural time-out, the oropharynx was anesthetized with 20% benzocaine spray.   During this procedure the patient was administered IV propofol  by Anesthesiology, Dr. Annabell Key  The transesophageal probe was inserted in the esophagus and stomach without difficulty and multiple views were obtained.  The patient was kept under observation until the patient left the procedure room.  The patient left the procedure room in stable condition.   Agitated microbubble saline contrast was administered.  COMPLICATIONS:    There were no immediate complications.  FINDINGS:  Left ventricular hypertrophy. Normal LV systolic function. Trileaflet aortic valve with degenerative changes, no discrete mass is seen. 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 Moderate aortic atherosclerosis.  RECOMMENDATIONS:     Consider evaluation for paradoxical embolism via PFO with hi-risk features.  Time Spent Directly with the Patient:  45 minutes   Danilo Cappiello 03/18/2024, 12:30 PM

## 2024-03-18 NOTE — Progress Notes (Signed)
 Echocardiogram Echocardiogram Transesophageal has been performed.  Kayla Hunt 03/18/2024, 12:40 PM

## 2024-03-18 NOTE — Progress Notes (Signed)
 Daily Progress Note   Patient Name: Kayla Hunt       Date: 03/18/2024 DOB: Dec 29, 1950  Age: 73 y.o. MRN#: [id number] Attending Physician: Feliciana Horn, MD Primary Care Physician: Marius Siemens, NP Admit Date: 03/13/2024  Reason for Consultation/Follow-up: Establishing goals of care  Subjective: Medical records personally reviewed including progress notes, labs and imaging - TEE notable for atrial septal aneurysm and PFO today. Patient assessed at the bedside. She is confused with soft restraints in place. No family present during my visit.  Attempted to call patient's daughter Tonya for palliative support and to provide update but was unable to reach. PMT contact information provided via voicemail.   She then returned my call and shared that she was at the bedside. I returned to visit in person and continue GOC discussion. Created space and opportunity for family's thoughts and feelings on patient's current illness. Provided education on increased risk for embolic stroke and potential complications/set backs from her chronic comorbidities including worsening debility and weakness, infections from poor mobility etc. This is completely different for Tonya with her mother, although she is understanding of the risks and trajectory. She only just found out about patient's seizures that occurred at [facility] years ago during this admission. She plans to manage all of patient's medications moving forward and hopes for much more time together, though if patient declined further she would prefer to keep her comfortable and avoid CPR/intubation. DNR requests are prevalent in her family.   Explored thoughts on interventions up until the point of cardiac arrest. Lynnie Saucier would likely agree to some procedures including PFO repair if indicated.  Spiritual  care consult was offered and accepted. Tonya is attempting to cope with patient's acute illness and mental decline. We again reviewed most recent neurology note and expectation for continued gradual improvement. She is very Adult nurse.  Questions and concerns addressed. PMT will continue to support holistically.   Length of Stay: 4   Physical Exam Vitals and nursing note reviewed.  Constitutional:      General: She is not in acute distress.    Appearance: She is ill-appearing.     Interventions: She is restrained.  Cardiovascular:     Rate and Rhythm: Normal rate.  Pulmonary:     Effort: Pulmonary effort is normal.  Neurological:     Mental Status: She is alert. She is confused.  Psychiatric:        Cognition and Memory: Cognition is impaired.             Vital Signs: BP (!) 94/40   Pulse 75   Temp 97.7 F (  36.5 C) (Tympanic)   Resp 11   Ht 5\' 1"  (1.549 m)   Wt 66.5 kg   SpO2 92%   BMI 27.70 kg/m  SpO2: SpO2: 92 % O2 Device: O2 Device: Nasal Cannula O2 Flow Rate: O2 Flow Rate (L/min): 5 L/min      Palliative Assessment/Data:   Palliative Care Assessment & Plan   Patient Profile: 73 y.o. female  with past medical history of hypertension, hyperlipidemia. Hypothyroidism, seizures, type 2 DM , chronic diastolic CHF, stage 4 CKD admitted on 03/13/2024 with confusion, slurred speech, weakness, right-sided facial droop.    In the ED, initial CT head without contrast and CT angio of the head and neck are negative for emergent large vessel occlusion.  MRI showed possible PRES.  Patient admitted now with acute metabolic encephalopathy, severe hypothyroidism.  Hypokalemia.   PMT has been consulted to assist with goals of care conversation.  Assessment: goals of care conversation Atrial septal aneurysm moderate PFO PRES Acute metabolic encephalopathy Severe hypothyroidism CKD4  Recommendations/Plan: Code status changed to DNR/DNI. Gold form was signed and placed on  patient's chart. Will scan copy into EMR Continue full scope treatment otherwise Provided education to include risk for embolic events in the future and importance of continued GOC discussions Daughter plans to discuss GOC further with her mother at home and understands patient is high risk for further decline. Daughter would pursue comfort focused approach at that time Goals clear for discharge home with [facility]; appreciate TOC assistance with coordinating DME as this was daughter's primary concern during our conversation yesterday Spiritual care consult requested, assistance is appreciated Provided with HCPOA documents at daughter's request Psychosocial and emotional support provided PMT remains available as needed   Prognosis:  Poor long-term prognosis  Discharge Planning: Home with Palliative Services   MDM: High   Nakoa Ganus Alroy Jericho, PA-C  Palliative Medicine Team Team phone # [phone]  Thank you for allowing the Palliative Medicine Team to assist in the care of this patient. Please utilize secure chat with additional questions, if there is no response within 30 minutes please call the above phone number.  Palliative Medicine Team providers are available by phone from 7am to 7pm daily and can be reached through the team cell phone.  Should this patient require assistance outside of these hours, please call the patient's attending physician.

## 2024-03-18 NOTE — Progress Notes (Signed)
 PT Cancellation Note  Patient Details Name: Kayla Hunt MRN: [id number] DOB: 11-26-1950   Cancelled Treatment:    Reason Eval/Treat Not Completed: Patient at procedure or test/unavailable; off the floor for TEE.  Will follow up when pt able to participate and schedule allows.    Marley Simmers 03/18/2024, 11:59 AM Abigail Hoff, PT Acute Rehabilitation Services Office:[phone] 03/18/2024

## 2024-03-19 DIAGNOSIS — G459 Transient cerebral ischemic attack, unspecified: Secondary | ICD-10-CM | POA: Diagnosis not present

## 2024-03-19 DIAGNOSIS — R2990 Unspecified symptoms and signs involving the nervous system: Secondary | ICD-10-CM | POA: Diagnosis not present

## 2024-03-19 DIAGNOSIS — I10 Essential (primary) hypertension: Secondary | ICD-10-CM | POA: Diagnosis not present

## 2024-03-19 DIAGNOSIS — E785 Hyperlipidemia, unspecified: Secondary | ICD-10-CM | POA: Diagnosis not present

## 2024-03-19 LAB — GLUCOSE, CAPILLARY
Glucose-Capillary: 67 mg/dL — ABNORMAL LOW (ref 70–99)
Glucose-Capillary: 68 mg/dL — ABNORMAL LOW (ref 70–99)
Glucose-Capillary: 73 mg/dL (ref 70–99)
Glucose-Capillary: 130 mg/dL — ABNORMAL HIGH (ref 70–99)
Glucose-Capillary: 151 mg/dL — ABNORMAL HIGH (ref 70–99)
Glucose-Capillary: 117 mg/dL — ABNORMAL HIGH (ref 70–99)
Glucose-Capillary: 155 mg/dL — ABNORMAL HIGH (ref 70–99)

## 2024-03-19 LAB — VITAMIN B1: Vitamin B1 (Thiamine): 85.6 nmol/L (ref 66.5–200.0)

## 2024-03-19 MED ORDER — SENNOSIDES-DOCUSATE SODIUM 8.6-50 MG PO TABS
2.0000 | ORAL_TABLET | Freq: Two times a day (BID) | ORAL | Status: DC
  Administered 2024-03-19 – 2024-03-20 (×3): 2 via ORAL
  Filled 2024-03-19 (×3): qty 2

## 2024-03-19 MED ORDER — POLYETHYLENE GLYCOL 3350 17 G PO PACK
17.0000 g | PACK | Freq: Every day | ORAL | Status: DC
  Administered 2024-03-19 – 2024-03-20 (×2): 17 g via ORAL
  Filled 2024-03-19 (×2): qty 1

## 2024-03-19 MED ORDER — BISACODYL 5 MG PO TBEC: 10.0000 mg | DELAYED_RELEASE_TABLET | Freq: Every day | ORAL | Status: DC | PRN

## 2024-03-19 NOTE — Progress Notes (Signed)
 The patient's capillary blood glucose (CBG) was 67 mg/dL this morning. She was given 240 mL of orange juice. A repeat CBG check 15 minutes later showed an increase to 73 mg/dL. We will encourage intake during breakfast

## 2024-03-19 NOTE — Plan of Care (Signed)
  Problem: Education: Goal: Knowledge of General Education information will improve Description: Including pain rating scale, medication(s)/side effects and non-pharmacologic comfort measures Outcome: Not Progressing   Problem: Health Behavior/Discharge Planning: Goal: Ability to manage health-related needs will improve Outcome: Not Progressing   Problem: Clinical Measurements: Goal: Ability to maintain clinical measurements within normal limits will improve Outcome: Progressing Goal: Will remain free from infection Outcome: Progressing Goal: Diagnostic test results will improve Outcome: Progressing Goal: Respiratory complications will improve Outcome: Progressing Goal: Cardiovascular complication will be avoided Outcome: Progressing   Problem: Activity: Goal: Risk for activity intolerance will decrease Outcome: Not Progressing   Problem: Nutrition: Goal: Adequate nutrition will be maintained Outcome: Progressing   Problem: Coping: Goal: Level of anxiety will decrease Outcome: Progressing   Problem: Elimination: Goal: Will not experience complications related to bowel motility Outcome: Progressing Goal: Will not experience complications related to urinary retention Outcome: Progressing   Problem: Pain Managment: Goal: General experience of comfort will improve and/or be controlled Outcome: Progressing   Problem: Safety: Goal: Ability to remain free from injury will improve Outcome: Progressing   Problem: Skin Integrity: Goal: Risk for impaired skin integrity will decrease Outcome: Progressing   Problem: Education: Goal: Knowledge of disease or condition will improve Outcome: Not Progressing Goal: Knowledge of secondary prevention will improve (MUST DOCUMENT ALL) Outcome: Not Progressing Goal: Knowledge of patient specific risk factors will improve (DELETE if not current risk factor) Outcome: Not Progressing   Problem: Coping: Goal: Will verbalize positive  feelings about self Outcome: Not Progressing Goal: Will identify appropriate support needs Outcome: Not Progressing   Problem: Health Behavior/Discharge Planning: Goal: Ability to manage health-related needs will improve Outcome: Not Progressing   Problem: Self-Care: Goal: Ability to participate in self-care as condition permits will improve Outcome: Not Progressing Goal: Verbalization of feelings and concerns over difficulty with self-care will improve Outcome: Not Progressing   Problem: Coping: Goal: Ability to adjust to condition or change in health will improve Outcome: Progressing   Problem: Fluid Volume: Goal: Ability to maintain a balanced intake and output will improve Outcome: Progressing   Problem: Metabolic: Goal: Ability to maintain appropriate glucose levels will improve Outcome: Progressing   Problem: Nutritional: Goal: Maintenance of adequate nutrition will improve Outcome: Progressing Goal: Progress toward achieving an optimal weight will improve Outcome: Progressing   Problem: Skin Integrity: Goal: Risk for impaired skin integrity will decrease Outcome: Progressing   Problem: Tissue Perfusion: Goal: Adequacy of tissue perfusion will improve Outcome: Progressing

## 2024-03-19 NOTE — Progress Notes (Signed)
 The patient's daughter Sandre Crown) called to request an update. She inquired whether her mother had taken the medications she previously refused. RN informed her that the scheduled time for those medications had passed, and the next dose was due in approximately one hour. During the call, the daughter became agitated and accused staff of contributing to the patient's confusion. RN explained that the patient may be experiencing delirium, which is common among hospitalized patients of similar age and condition. The daughter became verbally aggressive, expressed distrust in the staff, and alleged that the night shift nurse from last night had done something to her mother. Despite the RN's use of therapeutic communication and active listening, the daughter abruptly ended the call while the RN was clarifying the difference between delirium and dementia. The RN later followed up with a second call to inform her that the patient had consumed part of her meal and successfully took her medications.

## 2024-03-19 NOTE — Progress Notes (Signed)
 [facility]                                                                               [name], is a 73 y.o. female, DOB - 1950/12/08, ZOX:[id number] Admit date - 03/13/2024    Outpatient Primary MD for the patient is No primary care provider on file.  LOS - 5  days    Brief summary   Kayla Hunt is a 73 y.o. female with medical history significant of hypertension, hyperlipidemia. Hypothyroidism, seizures, type 2 DM , chronic diastolic CHF, stage 4 CKD, was brought in from home as a code stroke. As per the daughter, she has been confused, with slurred speech and word finding difficulty and has generalized weakness, with right sided facial droop and right sided weakness.  MRI brain is negative for acute stroke. Superimposed on the background of chronic white matter changes there are additional areas of signal abnormality within the right temporal and right occipital lobes as well as the bilateral basal ganglia and bilateral cerebellar hemispheres with possible edema. Findings reflect PRES in the appropriate clinical context. Neurology on board.    Assessment & Plan    Assessment and Plan:  Acute metabolic encephalopathy probably from PRES and severe hypothyroidism. Ammonia level is wnl. TSH is elevated.  Echocardiogram reviewed. Showed LVEF of 60 to 65%. Grade 1 diastolic dysfunction.   She was found to have a rounded density on the RCC best seen in the PLAX view , rule out mass.  Cardiology consulted for TEE. She underwent TEE showing moderate PFO and increased risk of embolic stroke. . No signs of infection. UA is negative. CXR No active disease.  LDL is 438. Added atorvastatin  80 mg daily.  MRI reviewed and discussed with the daughter over the phone.  SLP/PT/OT Eval.  TEE done , and showed moderate PFO. Venous duplex of the lower extremities.  Discussed with Dr Janett Medin with Neurology, no need for TCD bubble study.  Patient is more agitated and confused  today. Continue with aspirin  and plavix  for now.    Hypokalemia Replaced.  Repeat level wnl.    Hypothyroidism;  TSH around 95. Suspect from non compliance.  Free t4 and free t3 are  severely low.  Discussed with her daughter, who confirmed that she might not be taking her meds.  Encourage oral intake.   Hyperlipidemia:  LDL is 438. Continue with lipitor  80 mg daily.    Stage 4 CKD Creatinine stable at 1.8.    H/o Seizures:  Neurology on board.  EEG done and showed  mild diffuse encephalopathy. No seizures or epileptiform discharges were seen throughout the recording.    Type 2 Diabetes Mellitus:  CBG (last 3)  Recent Labs    03/19/24 0652 03/19/24 0814 03/19/24 1157  GLUCAP 73 130* 151*   A1c is 6.3. No change in meds.     Chronic diastolic heart failure:  Echo reviewed.  She appears wuvolemic.     Estimated body mass index is 27.7 kg/m as calculated from the following:   Height as of this encounter: 5\' 1"  (1.549 m).   Weight as of this encounter: 66.5 kg.  Code Status:  full code.  DVT Prophylaxis:  heparin  injection 5,000 Units Start: 03/14/24 1445Heparin   Level of Care: Level of care: Telemetry Medical Family Communication: Updated patient's care giver/ daughter over the phone.   Disposition Plan:     Remains inpatient appropriate: waiting for venous duplex of the lower extremities. possible d.c home tomorrow.   Procedures:  EEG.  Echo.   Consultants:   Neurology.  Cardiology for tee.   Antimicrobials:   Anti-infectives (From admission, onward)    None        Medications  Scheduled Meds:  allopurinol   100 mg Oral q AM   aspirin   81 mg Oral q AM   atorvastatin   80 mg Oral Daily   clopidogrel   75 mg Oral Daily   feeding supplement  237 mL Oral BID BM   heparin  injection (subcutaneous)  5,000 Units Subcutaneous Q8H   hydrALAZINE   50 mg Oral Q8H   losartan   50 mg Oral Daily   And   hydrochlorothiazide   12.5 mg Oral Daily    insulin  aspart  0-6 Units Subcutaneous TID WC   levothyroxine   100 mcg Oral Q0600   metoprolol  tartrate  50 mg Oral BID   polyethylene glycol  17 g Oral Daily   potassium chloride   40 mEq Oral Once   senna-docusate  2 tablet Oral BID   sodium chloride  flush  3 mL Intravenous Once   Continuous Infusions:   PRN Meds:.acetaminophen  **OR** acetaminophen , bisacodyl, haloperidol  lactate, hydrALAZINE , ondansetron  **OR** ondansetron  (ZOFRAN ) IV, mouth rinse    Subjective:   [name] was seen and examined today. Confused, but comfortable, no agitation.   Objective:   Vitals:   03/19/24 0426 03/19/24 0808 03/19/24 1212 03/19/24 1526  BP: 137/89 121/61 131/74 (!) 140/71  Pulse: 84 66 62 70  Resp: 18 18 19 20   Temp: 98.5 F (36.9 C) 97.6 F (36.4 C) 98.1 F (36.7 C) 99.4 F (37.4 C)  TempSrc: Axillary Oral Oral Oral  SpO2: 98% 98% 98% 97%  Weight:      Height:        Intake/Output Summary (Last 24 hours) at 03/19/2024 1602 Last data filed at 03/19/2024 1500 Gross per 24 hour  Intake 370 ml  Output 1000 ml  Net -630 ml   Filed Weights   03/15/24 0900  Weight: 66.5 kg     Exam General exam: Appears calm and comfortable  Respiratory system: Clear to auscultation. Respiratory effort normal. Cardiovascular system: S1 & S2 heard, RRR. No JVD,  Gastrointestinal system: Abdomen is nondistended, soft and nontender.  Central nervous system: Alert and oriented to person and place only.  Extremities: no pedal edema Skin: No rashes, Psychiatry. Mood is appropriate.       Data Reviewed:  I have personally reviewed following labs and imaging studies   CBC Lab Results  Component Value Date   WBC 3.4 (L) 03/14/2024   RBC 4.77 03/14/2024   HGB 13.3 03/14/2024   HCT 42.1 03/14/2024   MCV 88.3 03/14/2024   MCH 27.9 03/14/2024   PLT 192 03/14/2024   MCHC 31.6 03/14/2024   RDW 15.6 (H) 03/14/2024   LYMPHSABS 2.0 03/14/2024   MONOABS 0.3 03/14/2024   EOSABS 0.1  03/14/2024   BASOSABS 0.0 03/14/2024     Last metabolic panel Lab Results  Component Value Date   NA 138 03/15/2024   K 3.8 03/15/2024   CL 106 03/15/2024   CO2 26 03/15/2024   BUN 22 03/15/2024   CREATININE  1.81 (H) 03/15/2024   GLUCOSE 104 (H) 03/15/2024   GFRNONAA 30 (L) 03/15/2024   GFRAA 32 (L) 02/24/2021   CALCIUM  8.5 (L) 03/15/2024   PHOS 3.2 05/11/2023   PROT 6.5 03/13/2024   ALBUMIN 2.5 (L) 03/13/2024   LABGLOB 3.2 07/15/2019   AGRATIO 1.1 (L) 07/15/2019   BILITOT 0.5 03/13/2024   ALKPHOS 58 03/13/2024   AST 22 03/13/2024   ALT 11 03/13/2024   ANIONGAP 6 03/15/2024    CBG (last 3)  Recent Labs    03/19/24 0652 03/19/24 0814 03/19/24 1157  GLUCAP 73 130* 151*      Coagulation Profile: Recent Labs  Lab 03/13/24 1132  INR 1.0     Radiology Studies: ECHO TEE Result Date: 03/18/2024    TRANSESOPHOGEAL ECHO REPORT   Patient Name:   DULCY SIDA Date of Exam: 03/18/2024 Medical Rec #:  [id number]      Height:       61.0 in Accession #:    [id number]     Weight:       146.6 lb Date of Birth:  1951/09/18     BSA:          1.655 m Patient Age:    72 years       BP:           161/117 mmHg Patient Gender: F              HR:           84 bpm. Exam Location:  Inpatient Procedure: Transesophageal Echo, Cardiac Doppler, Color Doppler and Saline            Contrast Bubble Study (Both Spectral and Color Flow Doppler were            utilized during procedure). Indications:     Cerebral Infarction, unspecified I63.9  History:         Patient has prior history of Echocardiogram examinations, most                  recent 03/14/2024. TIA and CKD, stage 3; Risk                  Factors:Hypertension, Diabetes and Dyslipidemia.  Sonographer:     Terrilee Few RCS Referring Phys:  30 RHONDA G BARRETT Diagnosing Phys: Luana Rumple MD PROCEDURE: After discussion of the risks and benefits of a TEE, an informed consent was obtained from the patient. The transesophogeal probe was  passed without difficulty through the esophogus of the patient. Imaged were obtained with the patient in a left lateral decubitus position. Sedation performed by different physician. The patient was monitored while under deep sedation. Anesthestetic sedation was provided intravenously by Anesthesiology: 79.8mg  of Propofol , 0mg  of Lidocaine. The patient developed no complications during the procedure.  IMPRESSIONS  1. Left ventricular ejection fraction, by estimation, is 60 to 65%. The left ventricle has normal function. The left ventricle has no regional wall motion abnormalities. There is mild concentric left ventricular hypertrophy.  2. Right ventricular systolic function is normal. The right ventricular size is normal. Tricuspid regurgitation signal is inadequate for assessing PA pressure.  3. No left atrial/left atrial appendage thrombus was detected. The LAA emptying velocity was 50 cm/s.  4. A small pericardial effusion is present. There is no evidence of cardiac tamponade.  5. The mitral valve is normal in structure. No evidence of mitral valve regurgitation. No evidence of mitral stenosis.  6. The aortic valve is  tricuspid. There is mild calcification of the aortic valve. There is mild thickening of the aortic valve. Aortic valve regurgitation is mild to moderate. Aortic valve sclerosis/calcification is present, without any evidence of aortic stenosis.  7. There is Moderate (Grade III) protruding plaque involving the aortic arch and descending aorta.  8. Evidence of atrial level shunting detected by color flow Doppler. There is a moderately sized patent foramen ovale with bidirectional shunting across atrial septum. The atrial septum is aneurysmal and there is prompt right to left shunting during quiet breathing. FINDINGS  Left Ventricle: Left ventricular ejection fraction, by estimation, is 60 to 65%. The left ventricle has normal function. The left ventricle has no regional wall motion abnormalities. The  left ventricular internal cavity size was normal in size. There is  mild concentric left ventricular hypertrophy. Right Ventricle: The right ventricular size is normal. No increase in right ventricular wall thickness. Right ventricular systolic function is normal. Tricuspid regurgitation signal is inadequate for assessing PA pressure. Left Atrium: Left atrial size was normal in size. No left atrial/left atrial appendage thrombus was detected. The LAA emptying velocity was 50 cm/s. Right Atrium: Right atrial size was normal in size. Pericardium: A small pericardial effusion is present. There is no evidence of cardiac tamponade. Mitral Valve: The mitral valve is normal in structure. No evidence of mitral valve regurgitation. No evidence of mitral valve stenosis. Tricuspid Valve: The tricuspid valve is normal in structure. Tricuspid valve regurgitation is not demonstrated. Aortic Valve: No discrete mass is seen on the aortic valve leaflets. The aortic valve is tricuspid. There is mild calcification of the aortic valve. There is mild thickening of the aortic valve. Aortic valve regurgitation is mild to moderate. Aortic valve sclerosis/calcification is present, without any evidence of aortic stenosis. Pulmonic Valve: The pulmonic valve was grossly normal. Pulmonic valve regurgitation is not visualized. Aorta: The aortic root, ascending aorta, aortic arch and descending aorta are all structurally normal, with no evidence of dilitation or obstruction. There is moderate (Grade III) protruding plaque involving the aortic arch and descending aorta. IAS/Shunts: The interatrial septum is aneurysmal. Evidence of atrial level shunting detected by color flow Doppler. Agitated saline contrast was given intravenously to evaluate for intracardiac shunting. A moderately sized patent foramen ovale is detected with bidirectional shunting across atrial septum. Additional Comments: Spectral Doppler performed. Luana Rumple MD  Electronically signed by Luana Rumple MD Signature Date/Time: 03/18/2024/2:51:41 PM    Final    EP STUDY Result Date: 03/18/2024 See surgical note for result.       Feliciana Horn M.D. [facility] 03/19/2024, 4:02 PM  Available via Epic secure chat 7am-7pm After 7 pm, please refer to night coverage provider listed on amion.

## 2024-03-19 NOTE — Progress Notes (Signed)
 Physical Therapy Treatment Patient Details Name: Kayla Hunt MRN: [id number] DOB: [birth year] Today's Date: 03/19/2024   History of Present Illness Pt is 73 yo presenting to [facility] ED on 5/14 due to confusion, slurred speech and word finding difficulty with generalized weakness and R side facial droop/weakness. MRI (-). Pt with Acute metabolic encephalopathy. PMH: HTN, HLD, Hypothyroidism, seizures, DM II, chronic diastolic HF, CKD IV.    PT Comments  Pt with fair tolerance to treatment today. Pt very confused mistaking therapy staff for family/friend. Pt required max verbal and tactile cues for sequencing mobility. Pt with similar presentation to previous session. Pt able to stand with +2 Max A and required max cues for sequencing, encouragement, and safety. No change in DC/DME recs at this time. PT will continue to follow.     If plan is discharge home, recommend the following: Assistance with cooking/housework;Help with stairs or ramp for entrance;Two people to help with walking and/or transfers;Supervision due to cognitive status   Can travel by private vehicle     No  Equipment Recommendations  Wheelchair (measurements PT);Wheelchair cushion (measurements PT);Hoyer lift;Hospital bed    Recommendations for Other Services       Precautions / Restrictions Precautions Precautions: Fall Recall of Precautions/Restrictions: Impaired Restrictions Weight Bearing Restrictions Per Provider Order: No     Mobility  Bed Mobility Overal bed mobility: Needs Assistance Bed Mobility: Supine to Sit, Sit to Supine     Supine to sit: Supervision, HOB elevated, Used rails Sit to supine: Total assist   General bed mobility comments: Pt was supervision to get to EOB with HOB elevated and use of rails. Pt kept scooting closer and closer to EOB requiring Total A to get back to supine.    Transfers Overall transfer level: Needs assistance Equipment used: Rolling walker (2 wheels),  None Transfers: Sit to/from Stand Sit to Stand: +2 physical assistance, Max assist           General transfer comment: +2 Max A to stand however pt unable to fully come upright due to BLE knee flexion contractures.    Ambulation/Gait               General Gait Details: unable   Stairs             Wheelchair Mobility     Tilt Bed    Modified Rankin (Stroke Patients Only)       Balance Overall balance assessment: Needs assistance Sitting-balance support: Bilateral upper extremity supported, Feet supported Sitting balance-Leahy Scale: Fair Sitting balance - Comments: No LOB sitting EOB   Standing balance support: Bilateral upper extremity supported, During functional activity Standing balance-Leahy Scale: Poor Standing balance comment: Reliant on therapists                            Communication Communication Communication: Impaired Factors Affecting Communication: Reduced clarity of speech  Cognition Arousal: Alert Behavior During Therapy: Restless   PT - Cognitive impairments: No family/caregiver present to determine baseline, Orientation, Sequencing, Problem solving, Safety/Judgement, Memory   Orientation impairments: Time, Situation, Place                   PT - Cognition Comments: Pt very confused mistaking therapy staff for family/friend. Pt required max verbal and tactile cues for sequencing mobility. Following commands: Impaired Following commands impaired: Follows one step commands inconsistently    Cueing Cueing Techniques: Verbal cues, Gestural cues, Tactile cues, Visual cues  Exercises      General Comments General comments (skin integrity, edema, etc.): VSS      Pertinent Vitals/Pain Pain Assessment Pain Assessment: No/denies pain    Home Living                          Prior Function            PT Goals (current goals can now be found in the care plan section) Progress towards PT goals:  Progressing toward goals    Frequency    Min 2X/week      PT Plan      Co-evaluation              AM-PAC PT "6 Clicks" Mobility   Outcome Measure  Help needed turning from your back to your side while in a flat bed without using bedrails?: A Little Help needed moving from lying on your back to sitting on the side of a flat bed without using bedrails?: A Little Help needed moving to and from a bed to a chair (including a wheelchair)?: Total Help needed standing up from a chair using your arms (e.g., wheelchair or bedside chair)?: Total Help needed to walk in hospital room?: Total Help needed climbing 3-5 steps with a railing? : Total 6 Click Score: 10    End of Session Equipment Utilized During Treatment: Gait belt Activity Tolerance: Patient tolerated treatment well Patient left: in bed;with call bell/phone within reach;with bed alarm set Nurse Communication: Mobility status;Need for lift equipment PT Visit Diagnosis: Unsteadiness on feet (R26.81);Other abnormalities of gait and mobility (R26.89);Muscle weakness (generalized) (M62.81)     Time: 1610-9604 PT Time Calculation (min) (ACUTE ONLY): 10 min  Charges:    $Therapeutic Activity: 8-22 mins PT General Charges $$ ACUTE PT VISIT: 1 Visit                     Rodgers Clack, PT, DPT Acute Rehab Services [phone]    Sherilyn Windhorst 03/19/2024, 4:02 PM

## 2024-03-19 NOTE — TOC Transition Note (Addendum)
 Transition of Care [facility]) - Discharge Note   Patient Details  Name: Kayla Hunt MRN: [id number] Date of Birth: Sep 08, 1951  [facility] CM/SW Contact:  Omie Bickers, RN Phone Number: 03/19/2024, 10:17 AM   Clinical Narrative:     Kayla Hunt w daughter, confirmed address, [address] [place] [address] for PTAR. PTAR forms on chart  Daughter states that all DME has been delivered to the home.  Potential DC for today, notified HH agency.    15:15 spoke w MD, awaiting Venous doppler results. If patient is DC'd after hours, I have PTAR forms on chart and have instructed nurse to call PTAR.    Barriers to Discharge: Continued Medical Work up   Patient Goals and CMS Choice Patient states their goals for this hospitalization and ongoing recovery are:: patient unable to participate in goal setting, not fully oriented CMS Medicare.gov Compare Post Acute Care list provided to:: Patient Represenative (must comment) Choice offered to / list presented to : Adult Children      Discharge Placement                       Discharge Plan and Services Additional resources added to the After Visit Summary for       Post Acute Care Choice: Home Health          DME Arranged: Lightweight manual wheelchair with seat cushion, Other see comment Alen Amy lift) DME Agency: [employer] Date DME Agency Contacted: 03/15/24   Representative spoke with at DME Agency: [facility]            Social Drivers of Health (SDOH) Interventions SDOH Screenings   Housing: Patient Unable To Answer (03/15/2024)  Transportation Needs: Patient Unable To Answer (03/15/2024)  Utilities: Patient Unable To Answer (03/15/2024)  Depression (PHQ2-9): Low Risk  (01/27/2021)  Social Connections: Patient Unable To Answer (03/15/2024)  Tobacco Use: High Risk (03/18/2024)     Readmission Risk Interventions     No data to display

## 2024-03-19 NOTE — Progress Notes (Signed)
 Chaplain attempted a visit with pt in response to daughter's request for support.  The lights were out, the pt appeared to be sleeping and did not respond to her name being called.  Chaplain will attempt a visit at a later time.  Alexis Anger Chaplain

## 2024-03-20 ENCOUNTER — Other Ambulatory Visit (HOSPITAL_COMMUNITY): Payer: Self-pay

## 2024-03-20 ENCOUNTER — Inpatient Hospital Stay (HOSPITAL_COMMUNITY)

## 2024-03-20 DIAGNOSIS — I358 Other nonrheumatic aortic valve disorders: Secondary | ICD-10-CM

## 2024-03-20 DIAGNOSIS — R4182 Altered mental status, unspecified: Secondary | ICD-10-CM | POA: Diagnosis not present

## 2024-03-20 DIAGNOSIS — M7989 Other specified soft tissue disorders: Secondary | ICD-10-CM | POA: Diagnosis not present

## 2024-03-20 DIAGNOSIS — G459 Transient cerebral ischemic attack, unspecified: Secondary | ICD-10-CM | POA: Diagnosis not present

## 2024-03-20 DIAGNOSIS — R2990 Unspecified symptoms and signs involving the nervous system: Secondary | ICD-10-CM | POA: Diagnosis not present

## 2024-03-20 LAB — GLUCOSE, CAPILLARY: Glucose-Capillary: 139 mg/dL — ABNORMAL HIGH (ref 70–99)

## 2024-03-20 LAB — CREATININE, SERUM
Creatinine, Ser: 2.1 mg/dL — ABNORMAL HIGH (ref 0.44–1.00)
GFR, Estimated: 25 mL/min — ABNORMAL LOW (ref >60)

## 2024-03-20 MED ORDER — HYDRALAZINE HCL 50 MG PO TABS
50.0000 mg | ORAL_TABLET | Freq: Three times a day (TID) | ORAL | 3 refills | Status: DC
  Filled 2024-03-20: qty 180, 60d supply, fill #0

## 2024-03-20 MED ORDER — ENSURE ENLIVE PO LIQD
237.0000 mL | Freq: Two times a day (BID) | ORAL | 12 refills | Status: DC
  Filled 2024-03-20: qty 237, 1d supply, fill #0

## 2024-03-20 MED ORDER — ATORVASTATIN CALCIUM 80 MG PO TABS
80.0000 mg | ORAL_TABLET | Freq: Every day | ORAL | 1 refills | Status: DC
  Filled 2024-03-20: qty 30, 30d supply, fill #0

## 2024-03-20 NOTE — Plan of Care (Signed)
  Problem: Health Behavior/Discharge Planning: Goal: Ability to manage health-related needs will improve Outcome: Not Progressing   Problem: Clinical Measurements: Goal: Ability to maintain clinical measurements within normal limits will improve Outcome: Progressing Goal: Will remain free from infection Outcome: Progressing Goal: Diagnostic test results will improve Outcome: Progressing Goal: Respiratory complications will improve Outcome: Progressing Goal: Cardiovascular complication will be avoided Outcome: Progressing   Problem: Nutrition: Goal: Adequate nutrition will be maintained Outcome: Progressing   Problem: Coping: Goal: Level of anxiety will decrease Outcome: Progressing   Problem: Elimination: Goal: Will not experience complications related to bowel motility Outcome: Progressing Goal: Will not experience complications related to urinary retention Outcome: Progressing   Problem: Pain Managment: Goal: General experience of comfort will improve and/or be controlled Outcome: Progressing   Problem: Safety: Goal: Ability to remain free from injury will improve Outcome: Progressing   Problem: Skin Integrity: Goal: Risk for impaired skin integrity will decrease Outcome: Progressing   Problem: Education: Goal: Knowledge of disease or condition will improve Outcome: Progressing Goal: Knowledge of secondary prevention will improve (MUST DOCUMENT ALL) Outcome: Progressing Goal: Knowledge of patient specific risk factors will improve (DELETE if not current risk factor) Outcome: Progressing   Problem: Ischemic Stroke/TIA Tissue Perfusion: Goal: Complications of ischemic stroke/TIA will be minimized Outcome: Progressing   Problem: Coping: Goal: Will verbalize positive feelings about self Outcome: Progressing Goal: Will identify appropriate support needs Outcome: Progressing   Problem: Health Behavior/Discharge Planning: Goal: Ability to manage health-related  needs will improve Outcome: Progressing Goal: Goals will be collaboratively established with patient/family Outcome: Progressing   Problem: Self-Care: Goal: Ability to participate in self-care as condition permits will improve Outcome: Not Progressing Goal: Verbalization of feelings and concerns over difficulty with self-care will improve Outcome: Not Progressing Goal: Ability to communicate needs accurately will improve Outcome: Progressing   Problem: Education: Goal: Ability to describe self-care measures that may prevent or decrease complications (Diabetes Survival Skills Education) will improve Outcome: Progressing Goal: Individualized Educational Video(s) Outcome: Progressing   Problem: Education: Goal: Individualized Educational Video(s) Outcome: Progressing   Problem: Health Behavior/Discharge Planning: Goal: Ability to identify and utilize available resources and services will improve Outcome: Progressing Goal: Ability to manage health-related needs will improve Outcome: Progressing   Problem: Skin Integrity: Goal: Risk for impaired skin integrity will decrease Outcome: Progressing

## 2024-03-20 NOTE — Discharge Summary (Signed)
 Physician Discharge Summary   Patient: Kayla Hunt MRN: [id number] DOB: April 25, 1951  Admit date:     03/13/2024  Discharge date: 03/20/24  Discharge Physician: Feliciana Horn   PCP: No primary care provider on file.   Recommendations at discharge:  Please follow up with PCP in one week.  Recommend follow up with endocrinology in one week Please check thyroid panel in 2 to 4 weeks.   Discharge Diagnoses: Principal Problem:   TIA (transient ischemic attack) Active Problems:   Aortic valve mass    Hospital Course: Kayla Hunt is a 73 y.o. female with medical history significant of hypertension, hyperlipidemia. Hypothyroidism, seizures, type 2 DM , chronic diastolic CHF, stage 4 CKD, was brought in from home as a code stroke. As per the daughter, she has been confused, with slurred speech and word finding difficulty and has generalized weakness, with right sided facial droop and right sided weakness.  MRI brain is negative for acute stroke. Superimposed on the background of chronic white matter changes there are additional areas of signal abnormality within the right temporal and right occipital lobes as well as the bilateral basal ganglia and bilateral cerebellar hemispheres with possible edema. Findings reflect PRES in the appropriate clinical context. Neurology on board.   Assessment and Plan:  Acute metabolic encephalopathy probably from PRES and severe hypothyroidism. Ammonia level is wnl. TSH is elevated.  Echocardiogram reviewed. Showed LVEF of 60 to 65%. Grade 1 diastolic dysfunction.   She was found to have a rounded density on the RCC best seen in the PLAX view , rule out mass.  Cardiology consulted for TEE. She underwent TEE showing moderate PFO and increased risk of embolic stroke. . No signs of infection. UA is negative. CXR No active disease.  LDL is 438. Added atorvastatin  80 mg daily.  MRI reviewed and discussed with the daughter over the phone.  SLP/PT/OT Eval.   TEE done , and showed moderate PFO. Venous duplex of the lower extremities is negative for DVT. Aaron Aas  Discussed with Dr Janett Medin with Neurology, no need for TCD bubble study.  Continue with aspirin  on discharge.       Hypokalemia Replaced.  Repeat level wnl.      Hypothyroidism;  TSH around 95. Suspect from non compliance.  Free t4 and free t3 are  severely low.  Discussed with her daughter, who confirmed that she might not be taking her meds.  Encourage oral intake.    Hyperlipidemia:  LDL is 438. Continue with lipitor  80 mg daily.      Stage 4 CKD Creatinine stable at 1.8.      H/o Seizures:  Neurology on board.  EEG done and showed  mild diffuse encephalopathy. No seizures or epileptiform discharges were seen throughout the recording.      Type 2 Diabetes Mellitus:  Diet controlled.  A1c is 6.3.        Chronic diastolic heart failure:  Echo reviewed.  She appears euvolemic.       Estimated body mass index is 27.7 kg/m as calculated from the following:   Height as of this encounter: 5\' 1"  (1.549 m).   Weight as of this encounter: 66.5 kg.          Consultants: neurology. Cardiology.  Procedures performed: TEE  Disposition: Home Diet recommendation:  Discharge Diet Orders (From admission, onward)     Start     Ordered   03/20/24 0000  Diet - low sodium heart healthy  03/20/24 1043            DISCHARGE MEDICATION: Allergies as of 03/20/2024   No Known Allergies      Medication List     TAKE these medications    allopurinol  100 MG tablet Commonly known as: ZYLOPRIM  TAKE 1 TABLET(100 MG) BY MOUTH DAILY What changed: See the new instructions.   aspirin  81 MG chewable tablet Chew 81 mg by mouth in the morning.   atorvastatin  80 MG tablet Commonly known as: LIPITOR  Take 1 tablet (80 mg total) by mouth daily. Start taking on: Mar 21, 2024   bismuth subsalicylate 262 MG/15ML suspension Commonly known as: PEPTO BISMOL Take 30 mLs  by mouth as needed for indigestion or diarrhea or loose stools.   Blood Pressure Monitoring Kit 1 kit by Does not apply route 3 (three) times a week.   cetirizine 10 MG chewable tablet Commonly known as: ZYRTEC Chew 10 mg by mouth in the morning.   feeding supplement Liqd Take 237 mLs by mouth 2 (two) times daily between meals.   glucose blood test strip Check blood sugar once daily   hydrALAZINE  50 MG tablet Commonly known as: APRESOLINE  Take 1 tablet (50 mg total) by mouth 3 (three) times daily. What changed: when to take this   levothyroxine  100 MCG tablet Commonly known as: SYNTHROID  Take 100 mcg by mouth daily before breakfast.   losartan -hydrochlorothiazide  50-12.5 MG tablet Commonly known as: HYZAAR Take 1 tablet by mouth in the morning.   metoprolol  tartrate 50 MG tablet Commonly known as: LOPRESSOR  TAKE 1 TABLET(50 MG) BY MOUTH TWICE DAILY What changed: See the new instructions.   True Metrix Air Glucose Meter Devi 1 Bag by Does not apply route 3 (three) times daily as needed.   TRUEplus Lancets 26G Misc 1 Bottle by Does not apply route daily. Test blood sugar once daily        Follow-up Information     [facility] Follow up.   Specialty: Home Health Services Why: Physical and Occupational therapy. Office will call to arrange follow up after hosptial discharge. Contact information: [address] [place] [address] [phone]                Discharge Exam: Cleavon Curls Weights   03/15/24 0900  Weight: 66.5 kg   General exam: Appears calm and comfortable  Respiratory system: Clear to auscultation. Respiratory effort normal. Cardiovascular system: S1 & S2 heard, RRR. Gastrointestinal system: Abdomen is nondistended, soft and nontender.  Central nervous system: Alert and oriented to person only.  Extremities: Symmetric 5 x 5 power. Skin: No rashes, Psychiatry: Mood & affect appropriate.    Condition at  discharge: fair  The results of significant diagnostics from this hospitalization (including imaging, microbiology, ancillary and laboratory) are listed below for reference.   Imaging Studies: VAS US  LOWER EXTREMITY VENOUS (DVT) Result Date: 03/20/2024  Lower Venous DVT Study Patient Name:  KIMARA BENCOMO  Date of Exam:   03/20/2024 Medical Rec #: [id number]       Accession #:    [id number] Date of Birth: 09-10-1951      Patient Gender: F Patient Age:   89 years Exam Location:  [facility] Procedure:      VAS US  LOWER EXTREMITY VENOUS (DVT) Referring Phys: Feliciana Horn --------------------------------------------------------------------------------  Indications: Stroke, and Swelling.  Comparison Study: No recent studies. 04/12/18 - negative Performing Technologist: Franky Ivanoff Sturdivant-Jones RDMS, RVT  Examination Guidelines: A  complete evaluation includes B-mode imaging, spectral Doppler, color Doppler, and power Doppler as needed of all accessible portions of each vessel. Bilateral testing is considered an integral part of a complete examination. Limited examinations for reoccurring indications may be performed as noted. The reflux portion of the exam is performed with the patient in reverse Trendelenburg.  +---------+---------------+---------+-----------+----------+--------------+ RIGHT    CompressibilityPhasicitySpontaneityPropertiesThrombus Aging +---------+---------------+---------+-----------+----------+--------------+ CFV      Full           Yes      Yes                                 +---------+---------------+---------+-----------+----------+--------------+ SFJ      Full                                                        +---------+---------------+---------+-----------+----------+--------------+ FV Prox  Full                                                        +---------+---------------+---------+-----------+----------+--------------+ FV Mid   Full                                                         +---------+---------------+---------+-----------+----------+--------------+ FV DistalFull                                                        +---------+---------------+---------+-----------+----------+--------------+ PFV      Full                                                        +---------+---------------+---------+-----------+----------+--------------+ POP      Full           Yes      Yes                                 +---------+---------------+---------+-----------+----------+--------------+ PTV      Full                                                        +---------+---------------+---------+-----------+----------+--------------+ PERO     Full                                                        +---------+---------------+---------+-----------+----------+--------------+   +---------+---------------+---------+-----------+----------+--------------+  LEFT     CompressibilityPhasicitySpontaneityPropertiesThrombus Aging +---------+---------------+---------+-----------+----------+--------------+ CFV      Full           Yes      Yes                                 +---------+---------------+---------+-----------+----------+--------------+ SFJ      Full                                                        +---------+---------------+---------+-----------+----------+--------------+ FV Prox  Full                                                        +---------+---------------+---------+-----------+----------+--------------+ FV Mid   Full                                                        +---------+---------------+---------+-----------+----------+--------------+ FV DistalFull                                                        +---------+---------------+---------+-----------+----------+--------------+ PFV      Full                                                         +---------+---------------+---------+-----------+----------+--------------+ POP      Full           Yes      Yes                                 +---------+---------------+---------+-----------+----------+--------------+ PTV      Full                                                        +---------+---------------+---------+-----------+----------+--------------+ PERO     Full                                                        +---------+---------------+---------+-----------+----------+--------------+   Summary: RIGHT: - There is no evidence of deep vein thrombosis in the lower extremity.  - No cystic structure found in the popliteal fossa.  LEFT: - There is no evidence of deep vein thrombosis in the lower extremity.  - A cystic structure  is found in the popliteal fossa. - measuring 4.1 x 2.7 x 1.1 cm  *See table(s) above for measurements and observations. Electronically signed by Jimmye Moulds MD on 03/20/2024 at 10:15:20 AM.    Final    ECHO TEE Result Date: 03/18/2024    TRANSESOPHOGEAL ECHO REPORT   Patient Name:   MILLI WOOLRIDGE Date of Exam: 03/18/2024 Medical Rec #:  [id number]      Height:       61.0 in Accession #:    [id number]     Weight:       146.6 lb Date of Birth:  Aug 07, 1951     BSA:          1.655 m Patient Age:    72 years       BP:           161/117 mmHg Patient Gender: F              HR:           84 bpm. Exam Location:  Inpatient Procedure: Transesophageal Echo, Cardiac Doppler, Color Doppler and Saline            Contrast Bubble Study (Both Spectral and Color Flow Doppler were            utilized during procedure). Indications:     Cerebral Infarction, unspecified I63.9  History:         Patient has prior history of Echocardiogram examinations, most                  recent 03/14/2024. TIA and CKD, stage 3; Risk                  Factors:Hypertension, Diabetes and Dyslipidemia.  Sonographer:     Terrilee Few RCS Referring Phys:  1 RHONDA G BARRETT Diagnosing  Phys: Luana Rumple MD PROCEDURE: After discussion of the risks and benefits of a TEE, an informed consent was obtained from the patient. The transesophogeal probe was passed without difficulty through the esophogus of the patient. Imaged were obtained with the patient in a left lateral decubitus position. Sedation performed by different physician. The patient was monitored while under deep sedation. Anesthestetic sedation was provided intravenously by Anesthesiology: 79.8mg  of Propofol , 0mg  of Lidocaine. The patient developed no complications during the procedure.  IMPRESSIONS  1. Left ventricular ejection fraction, by estimation, is 60 to 65%. The left ventricle has normal function. The left ventricle has no regional wall motion abnormalities. There is mild concentric left ventricular hypertrophy.  2. Right ventricular systolic function is normal. The right ventricular size is normal. Tricuspid regurgitation signal is inadequate for assessing PA pressure.  3. No left atrial/left atrial appendage thrombus was detected. The LAA emptying velocity was 50 cm/s.  4. A small pericardial effusion is present. There is no evidence of cardiac tamponade.  5. The mitral valve is normal in structure. No evidence of mitral valve regurgitation. No evidence of mitral stenosis.  6. The aortic valve is tricuspid. There is mild calcification of the aortic valve. There is mild thickening of the aortic valve. Aortic valve regurgitation is mild to moderate. Aortic valve sclerosis/calcification is present, without any evidence of aortic stenosis.  7. There is Moderate (Grade III) protruding plaque involving the aortic arch and descending aorta.  8. Evidence of atrial level shunting detected by color flow Doppler. There is a moderately sized patent foramen ovale with bidirectional shunting across atrial septum. The atrial septum is aneurysmal and there  is prompt right to left shunting during quiet breathing. FINDINGS  Left Ventricle:  Left ventricular ejection fraction, by estimation, is 60 to 65%. The left ventricle has normal function. The left ventricle has no regional wall motion abnormalities. The left ventricular internal cavity size was normal in size. There is  mild concentric left ventricular hypertrophy. Right Ventricle: The right ventricular size is normal. No increase in right ventricular wall thickness. Right ventricular systolic function is normal. Tricuspid regurgitation signal is inadequate for assessing PA pressure. Left Atrium: Left atrial size was normal in size. No left atrial/left atrial appendage thrombus was detected. The LAA emptying velocity was 50 cm/s. Right Atrium: Right atrial size was normal in size. Pericardium: A small pericardial effusion is present. There is no evidence of cardiac tamponade. Mitral Valve: The mitral valve is normal in structure. No evidence of mitral valve regurgitation. No evidence of mitral valve stenosis. Tricuspid Valve: The tricuspid valve is normal in structure. Tricuspid valve regurgitation is not demonstrated. Aortic Valve: No discrete mass is seen on the aortic valve leaflets. The aortic valve is tricuspid. There is mild calcification of the aortic valve. There is mild thickening of the aortic valve. Aortic valve regurgitation is mild to moderate. Aortic valve sclerosis/calcification is present, without any evidence of aortic stenosis. Pulmonic Valve: The pulmonic valve was grossly normal. Pulmonic valve regurgitation is not visualized. Aorta: The aortic root, ascending aorta, aortic arch and descending aorta are all structurally normal, with no evidence of dilitation or obstruction. There is moderate (Grade III) protruding plaque involving the aortic arch and descending aorta. IAS/Shunts: The interatrial septum is aneurysmal. Evidence of atrial level shunting detected by color flow Doppler. Agitated saline contrast was given intravenously to evaluate for intracardiac shunting. A  moderately sized patent foramen ovale is detected with bidirectional shunting across atrial septum. Additional Comments: Spectral Doppler performed. Luana Rumple MD Electronically signed by Luana Rumple MD Signature Date/Time: 03/18/2024/2:51:41 PM    Final    EP STUDY Result Date: 03/18/2024 See surgical note for result.  ECHOCARDIOGRAM COMPLETE Result Date: 03/14/2024    ECHOCARDIOGRAM REPORT   Patient Name:   DONYA HITCH Date of Exam: 03/14/2024 Medical Rec #:  [id number]      Height:       64.0 in Accession #:    [id number]     Weight:       270.2 lb Date of Birth:  August 20, 1951     BSA:          2.223 m Patient Age:    72 years       BP:           136/87 mmHg Patient Gender: F              HR:           69 bpm. Exam Location:  Inpatient Procedure: 2D Echo, Cardiac Doppler and Color Doppler (Both Spectral and Color            Flow Doppler were utilized during procedure). Indications:    TIA  History:        Patient has prior history of Echocardiogram examinations, most                 recent 04/16/2018. Signs/Symptoms:Altered Mental Status; Risk                 Factors:Dyslipidemia, Diabetes and Hypertension.  Sonographer:    Raynelle Callow RDCS Referring Phys: Myron Asters  Sonographer Comments: Technically  difficult study due to poor echo windows. Image acquisition challenging due to uncooperative patient. Unable to finish exam. Patient could not keep her arms down and was leaning forward in bed. Patient pushed on probe with arms. IMPRESSIONS  1. Left ventricular ejection fraction, by estimation, is 60 to 65%. The left ventricle has normal function. The left ventricle has no regional wall motion abnormalities. There is mild concentric left ventricular hypertrophy. Left ventricular diastolic parameters are consistent with Grade I diastolic dysfunction (impaired relaxation).  2. Right ventricular systolic function is normal. The right ventricular size is normal. Tricuspid regurgitation signal is  inadequate for assessing PA pressure.  3. The mitral valve is degenerative. No evidence of mitral valve regurgitation. No evidence of mitral stenosis.  4. There is a rounded density on the RCC best seen in the PLAX view that may be calcific degeneration but cannot rule out mass. Consider TEE for further evaulation. . The aortic valve is abnormal. There is moderate calcification of the aortic valve. There is moderate thickening of the aortic valve. Aortic valve regurgitation is trivial. Aortic valve sclerosis/calcification is present, without any evidence of aortic stenosis. Aortic valve area, by VTI measures 3.33 cm. Aortic valve mean gradient measures 2.7 mmHg. Aortic valve Vmax measures 1.17 m/s.  5. The inferior vena cava is normal in size with greater than 50% respiratory variability, suggesting right atrial pressure of 3 mmHg.  6. A small pericardial effusion is present. The pericardial effusion is anterior to the right ventricle. Conclusion(s)/Recommendation(s): No intracardiac source of embolism detected on this transthoracic study. Consider a transesophageal echocardiogram to exclude cardiac source of embolism if clinically indicated. FINDINGS  Left Ventricle: Left ventricular ejection fraction, by estimation, is 60 to 65%. The left ventricle has normal function. The left ventricle has no regional wall motion abnormalities. The left ventricular internal cavity size was normal in size. There is  mild concentric left ventricular hypertrophy. Left ventricular diastolic parameters are consistent with Grade I diastolic dysfunction (impaired relaxation). Normal left ventricular filling pressure. Right Ventricle: The right ventricular size is normal. No increase in right ventricular wall thickness. Right ventricular systolic function is normal. Tricuspid regurgitation signal is inadequate for assessing PA pressure. Left Atrium: Left atrial size was normal in size. Right Atrium: Right atrial size was normal in  size. Pericardium: A small pericardial effusion is present. The pericardial effusion is anterior to the right ventricle. Mitral Valve: The mitral valve is degenerative in appearance. There is mild calcification of the mitral valve leaflet(s). Mild mitral annular calcification. No evidence of mitral valve regurgitation. No evidence of mitral valve stenosis. Tricuspid Valve: The tricuspid valve is normal in structure. Tricuspid valve regurgitation is not demonstrated. No evidence of tricuspid stenosis. Aortic Valve: There is a rounded density on the RCC best seen in the PLAX view that may be calcific degeneration but cannot rule out mass. Consider TEE for further evaulation. The aortic valve is abnormal. There is moderate calcification of the aortic valve. There is moderate thickening of the aortic valve. Aortic valve regurgitation is trivial. Aortic valve sclerosis/calcification is present, without any evidence of aortic stenosis. Aortic valve mean gradient measures 2.7 mmHg. Aortic valve peak gradient measures 5.5 mmHg. Aortic valve area, by VTI measures 3.33 cm. Pulmonic Valve: The pulmonic valve was normal in structure. Pulmonic valve regurgitation is not visualized. No evidence of pulmonic stenosis. Aorta: The aortic root is normal in size and structure. Venous: The inferior vena cava is normal in size with greater than 50% respiratory variability,  suggesting right atrial pressure of 3 mmHg. IAS/Shunts: No atrial level shunt detected by color flow Doppler.  LEFT VENTRICLE PLAX 2D LVIDd:         3.60 cm     Diastology LVIDs:         2.70 cm     LV e' medial:    5.44 cm/s LV PW:         1.30 cm     LV E/e' medial:  6.9 LV IVS:        1.20 cm     LV e' lateral:   4.79 cm/s LVOT diam:     2.20 cm     LV E/e' lateral: 7.9 LV SV:         79 LV SV Index:   36 LVOT Area:     3.80 cm  LV Volumes (MOD) LV vol d, MOD A2C: 57.6 ml LV vol d, MOD A4C: 38.9 ml LV vol s, MOD A2C: 12.3 ml LV vol s, MOD A4C: 14.9 ml LV SV MOD  A2C:     45.3 ml LV SV MOD A4C:     38.9 ml LV SV MOD BP:      34.6 ml RIGHT VENTRICLE RV S prime:     12.60 cm/s TAPSE (M-mode): 1.7 cm LEFT ATRIUM             Index        RIGHT ATRIUM           Index LA diam:        4.20 cm 1.89 cm/m   RA Area:     12.70 cm LA Vol (A2C):   35.8 ml 16.10 ml/m  RA Volume:   28.40 ml  12.77 ml/m LA Vol (A4C):   26.0 ml 11.69 ml/m LA Biplane Vol: 31.1 ml 13.99 ml/m  AORTIC VALVE AV Area (Vmax):    3.19 cm AV Area (Vmean):   3.03 cm AV Area (VTI):     3.33 cm AV Vmax:           117.33 cm/s AV Vmean:          77.533 cm/s AV VTI:            0.237 m AV Peak Grad:      5.5 mmHg AV Mean Grad:      2.7 mmHg LVOT Vmax:         98.50 cm/s LVOT Vmean:        61.900 cm/s LVOT VTI:          0.208 m LVOT/AV VTI ratio: 0.88  AORTA Ao Root diam: 3.30 cm Ao Asc diam:  3.40 cm MITRAL VALVE MV Area (PHT): 3.21 cm    SHUNTS MV Decel Time: 236 msec    Systemic VTI:  0.21 m MV E velocity: 37.70 cm/s  Systemic Diam: 2.20 cm MV A velocity: 80.20 cm/s MV E/A ratio:  0.47 Gaylyn Keas MD Electronically signed by Gaylyn Keas MD Signature Date/Time: 03/14/2024/3:03:13 PM    Final    EEG adult Result Date: 03/14/2024 Arleene Lack, MD     03/14/2024 12:13 PM Patient Name: SHARIFA BUCHOLZ MRN: [id number] Epilepsy Attending: Arleene Lack Referring Physician/Provider: Colon Dear, NP Date: 03/14/2024 Duration: 28.41 mins Patient history: 73yo F with ams. EEG to evaluate for seizure Level of alertness: Awake AEDs during EEG study: None Technical aspects: This EEG study was done with scalp electrodes positioned according to the 10-20 International  system of electrode placement. Electrical activity was reviewed with band pass filter of 1-70Hz , sensitivity of 7 uV/mm, display speed of 72mm/sec with a 60Hz  notched filter applied as appropriate. EEG data were recorded continuously and digitally stored.  Video monitoring was available and reviewed as appropriate. Description: The posterior  dominant rhythm consists of 8 Hz activity of moderate voltage (25-35 uV) seen predominantly in posterior head regions, symmetric and reactive to eye opening and eye closing. Intermittent generalized 5-7hz  theta slowing was also noted. Hyperventilation and photic stimulation were not performed.   ABNORMALITY - Intermittent slow, generalized IMPRESSION: This study is suggestive of mild diffuse encephalopathy. No seizures or epileptiform discharges were seen throughout the recording. Arleene Lack   MR BRAIN WO CONTRAST Result Date: 03/13/2024 CLINICAL DATA:  Neuro deficit, concern for stroke, right-sided deficits. EXAM: MRI HEAD WITHOUT CONTRAST TECHNIQUE: Multiplanar, multiecho pulse sequences of the brain and surrounding structures were obtained without intravenous contrast. COMPARISON:  Same day CT head and CTA head and neck. FINDINGS: Brain: No acute infarct. No evidence of intracranial hemorrhage. Scattered and confluent T2/FLAIR signal abnormality within the periventricular and subcortical white matter which is significantly increased since 2012. Additional signal abnormality in the posterior right temporal and right occipital lobes with suggestion of mild gyral expansion. There is additional T2/FLAIR signal abnormality in the bilateral cerebellar hemispheres which is significantly increased from prior. Additional signal abnormality involving the basal ganglia and pons. No midline shift. Small focus of encephalomalacia in the lateral right temporal lobe which may reflect remote trauma versus infarct. The basilar cisterns are patent. No extra-axial fluid collections. Ventricles: Normal size and configuration of the ventricles. Vascular: Skull base flow voids are visualized. Skull and upper cervical spine: No focal abnormality. Sinuses/Orbits: Orbits are symmetric. Paranasal sinuses are clear. Other: Mastoid air cells are clear. IMPRESSION: Multiple sequences are degraded by motion artifact. No acute  infarct. Significantly increased white matter signal abnormality compared to 2012 likely reflecting chronic microvascular ischemic changes. Superimposed on the background of chronic white matter changes there are additional areas of signal abnormality within the right temporal and right occipital lobes as well as the bilateral basal ganglia and bilateral cerebellar hemispheres with possible edema. Findings could reflect PRES in the appropriate clinical context. Electronically Signed   By: Denny Flack M.D.   On: 03/13/2024 18:35   DG Chest 1 View Result Date: 03/13/2024 CLINICAL DATA:  Stroke.  History of CHF. EXAM: CHEST  1 VIEW COMPARISON:  Chest radiograph dated 05/05/2011. FINDINGS: No focal consolidation, pleural effusion, pneumothorax. Borderline cardiomegaly. No acute osseous pathology. IMPRESSION: 1. No active disease. 2. Borderline cardiomegaly. Electronically Signed   By: Angus Bark M.D.   On: 03/13/2024 14:24   CT ANGIO HEAD NECK W WO CM W PERF (CODE STROKE) Result Date: 03/13/2024 CLINICAL DATA:  73 year old female code stroke presentation. Dysarthria, weakness. Questionable hyperdense left MCA on plain head CT. EXAM: CT ANGIOGRAPHY HEAD AND NECK CT PERFUSION BRAIN TECHNIQUE: Multidetector CT imaging of the head and neck was performed using the standard protocol during bolus administration of intravenous contrast. Multiplanar CT image reconstructions and MIPs were obtained to evaluate the vascular anatomy. Carotid stenosis measurements (when applicable) are obtained utilizing NASCET criteria, using the distal internal carotid diameter as the denominator. Multiphase CT imaging of the brain was performed following IV bolus contrast injection. Subsequent parametric perfusion maps were calculated using RAPID software. RADIATION DOSE REDUCTION: This exam was performed according to the departmental dose-optimization program which includes automated exposure control, adjustment  of the mA and/or kV  according to patient size and/or use of iterative reconstruction technique. CONTRAST:  OMNIPAQUE  IOHEXOL  350 MG/ML SOLN COMPARISON:  Plain head CT 1135 hours today. FINDINGS: CT Brain Perfusion Findings: ASPECTS: 10 (chronic encephalomalacia in the right hemisphere). CTP is motion degraded. CBF (<30%) Volume: 0mL, with no CBF for CBV parameter abnormality detected. Perfusion (Tmax>6.0s) volume: 11mL, although right temporal lobe, inferior frontal gyrus Mismatch Volume: Up to 11mL Infarction Location:No core infarct detected. CTA NECK Skeleton: Carious and absent dentition. No acute osseous abnormality identified. Upper chest: Negative. Other neck: Nonvascular neck soft tissue spaces appear within normal limits. Aortic arch: Tortuous aortic arch with soft and calcified plaque. Three vessel arch configuration. Right carotid system: Brachiocephalic artery tortuosity, soft and calcified plaque without stenosis. Tortuous proximal right CCA with mild calcified plaque, no stenosis. Mild distal right CCA and mild right carotid bifurcation calcified plaque without stenosis. Mild right carotid bulb calcified plaque. No significant right ICA stenosis to the skull base. Left carotid system: Similar tortuosity and mild atherosclerosis without hemodynamically significant stenosis. Vertebral arteries: Tortuous brachiocephalic artery with soft plaque, no stenosis. Calcified plaque at the right vertebral artery origin on series 5, image 137 with moderate stenosis. The right vertebral remains patent to the skull base and is tortuous in the neck with mild additional calcified plaque, no other significant stenosis. Proximal left subclavian artery is tortuous with soft plaque. The left vertebral artery origin is patent but appears stenotic, with highly diminutive and poorly enhancing left V1 vertebral artery (series 7, image 253). Largely occluded left V2 segment. Reconstitution of the left V3 segment from a muscular branch in the  upper neck. Distal left V3 segment appears irregular and stenotic at the skull base. CTA HEAD Posterior circulation: Left V4 segment appears functionally occluded, minimally enhancing. Right vertebral V4 is patent and supplies the basilar without stenosis. Patent basilar artery. Patent SCA and PCA origins. Small left posterior communicating artery. Left PCA branches are patent with mild irregularity, mild to moderate left P3 stenosis series 13, image 26. Right PCA branches are patent with mild irregularity. Anterior circulation: Both ICA siphons are patent. Left siphon calcified plaque is mild to moderate, with only mild left siphon stenosis. Normal left posterior communicating artery origin. Right siphon is tortuous with mild to moderate calcified plaque and mild to moderate stenosis at the anterior genu series 10, image 100. Patent carotid termini. Patent MCA and ACA origins. Tortuous A1 segments. Right A1 appears mildly dominant. Normal anterior communicating artery. Bilateral ACA branches are patent. There is mild right A2 irregularity and stenosis on series 13, image 23. Right MCA M1 segment is tortuous. Right MCA bifurcation is patent without stenosis. Right MCA branches appear patent. Left MCA M1 segment is tortuous. Left MCA bifurcation is patent. Left MCA branches appear patent. No branch stenosis or occlusion identified. Venous sinuses: Early contrast timing, not evaluated. Anatomic variants: Affectively dominant right vertebral artery. Mildly dominant right ACA A1. Review of the MIP images confirms the above findings Preliminary report of the CTA discussed by telephone with Dr. ASHISH ARORA on 03/13/2024 at 1201 hours. IMPRESSION: 1. Motion degraded CTP. No infarct core detected, questionable 11 mL oligemia primarily in the right temporal lobe. 2. 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 Only the V3 segment demonstrates  relatively normal enhancement, due to reconstitution from a muscular branch there. 3. Right Vertebral Artery supplies the basilar, with Moderate origin stenosis due  to calcified plaque. 4. No extracranial carotid stenosis. Mild to moderate ICA siphon plaque with mild to moderate right ICA anterior genu stenosis. No other hemodynamically significant anterior or posterior circulation stenosis. 5.  Aortic Atherosclerosis (ICD10-I70.0). Electronically Signed   By: Marlise Simpers M.D.   On: 03/13/2024 12:20   CT HEAD CODE STROKE WO CONTRAST Result Date: 03/13/2024 CLINICAL DATA:  Code stroke. 73 year old female with right side deficit. EXAM: CT HEAD WITHOUT CONTRAST TECHNIQUE: Contiguous axial images were obtained from the base of the skull through the vertex without intravenous contrast. RADIATION DOSE REDUCTION: This exam was performed according to the departmental dose-optimization program which includes automated exposure control, adjustment of the mA and/or kV according to patient size and/or use of iterative reconstruction technique. COMPARISON:  Brain MRI 05/06/2011.  Head CT 05/05/2011. FINDINGS: Brain: Cerebral volume not significantly changed since 2012. No midline shift, ventriculomegaly, mass effect, evidence of mass lesion, intracranial hemorrhage or evidence of cortically based acute infarction. Patchy bilateral cerebral white matter hypodensity which is fairly symmetric. However, there is chronic cortical encephalomalacia in the posterior and lateral right temporal lobe, right occipital pole stable since 2012. See series 3, images 15 through 20. Vascular: Questionable hyperdense left MCA branch in the sylvian fissure series 6, image 41. Skull: Intact.  No acute osseous abnormality identified. Sinuses/Orbits: Progressed chronic right maxillary sinus disease with mucoperiosteal thickening. Chronic right middle ear and mastoid opacification is stable. Other: No significant gaze deviation. Visualized orbits and  scalp soft tissues are within normal limits. ASPECTS [facility] Stroke Program Early CT Score) - Ganglionic level infarction (caudate, lentiform nuclei, internal capsule, insula, M1-M3 cortex): - Supraganglionic infarction (M4-M6 cortex): Total score (0-10 with 10 being normal): IMPRESSION: 1. Questionable Hyperdense Left MCA branch in the Sylvian fissure. But no cortically based acute infarct or acute intracranial hemorrhage identified. ASPECTS 10. 2. 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 Other chronic white matter disease. These results were communicated to Dr. Arora at 11:42 am on 03/13/2024 by text page via the [facility] messaging system. Electronically Signed   By: Marlise Simpers M.D.   On: 03/13/2024 11:43    Microbiology: Results for orders placed or performed during the hospital encounter of 04/11/18  MRSA PCR Screening     Status: None   Collection Time: 04/12/18  7:57 PM   Specimen: Nasal Mucosa; Nasopharyngeal  Result Value Ref Range Status   MRSA by PCR NEGATIVE NEGATIVE Final    Comment:        The GeneXpert MRSA Assay (FDA approved for NASAL specimens only), is one component of a comprehensive MRSA colonization surveillance program. It is not intended to diagnose MRSA infection nor to guide or monitor treatment for MRSA infections. Performed at [facility], [address], [place], [place] [address]   Culture, Urine     Status: None   Collection Time: 04/15/18 12:53 PM   Specimen: Urine, Random  Result Value Ref Range Status   Specimen Description URINE, RANDOM  Final   Special Requests NONE  Final   Culture   Final    NO GROWTH Performed at [facility], [address] [place], [place] [address]    Report Status 04/16/2018 FINAL  Final  Culture, blood (routine x 2)     Status: None   Collection Time: 04/18/18  1:24 AM   Specimen: BLOOD  Result Value Ref Range  Status  Specimen Description BLOOD RIGHT ANTECUBITAL  Final   Special Requests   Final    BOTTLES DRAWN AEROBIC AND ANAEROBIC Blood Culture adequate volume   Culture   Final    NO GROWTH 5 DAYS Performed at [facility], [address], [place], [place] [address]    Report Status 04/23/2018 FINAL  Final  Culture, blood (routine x 2)     Status: Abnormal   Collection Time: 04/18/18  1:26 AM   Specimen: BLOOD RIGHT HAND  Result Value Ref Range Status   Specimen Description BLOOD RIGHT HAND  Final   Special Requests   Final    BOTTLES DRAWN AEROBIC AND ANAEROBIC Blood Culture adequate volume   Culture  Setup Time   Final    GRAM POSITIVE COCCI ANAEROBIC BOTTLE ONLY CRITICAL RESULT CALLED TO, READ BACK BY AND VERIFIED WITH: J LEDFORD [facility] 04/18/18 2353 JDW    Culture (A)  Final    STAPHYLOCOCCUS SPECIES (COAGULASE NEGATIVE) THE SIGNIFICANCE OF ISOLATING THIS ORGANISM FROM A SINGLE SET OF BLOOD CULTURES WHEN MULTIPLE SETS ARE DRAWN IS UNCERTAIN. PLEASE NOTIFY THE MICROBIOLOGY DEPARTMENT WITHIN ONE WEEK IF SPECIATION AND SENSITIVITIES ARE REQUIRED. Performed at [facility], [address] [place], [place] [address]    Report Status 04/21/2018 FINAL  Final  Blood Culture ID Panel (Reflexed)     Status: Abnormal   Collection Time: 04/18/18  1:26 AM  Result Value Ref Range Status   Enterococcus species NOT DETECTED NOT DETECTED Final   Listeria monocytogenes NOT DETECTED NOT DETECTED Final   Staphylococcus species DETECTED (A) NOT DETECTED Final    Comment: Methicillin (oxacillin) resistant coagulase negative staphylococcus. Possible blood culture contaminant (unless isolated from more than one blood culture draw or clinical case suggests pathogenicity). No antibiotic treatment is indicated for blood  culture contaminants. CRITICAL RESULT CALLED TO, READ BACK BY AND VERIFIED WITH: J LEDFORD [facility] 04/18/18 2353 JDW    Staphylococcus aureus (BCID) NOT DETECTED NOT DETECTED  Final   Methicillin resistance DETECTED (A) NOT DETECTED Final    Comment: CRITICAL RESULT CALLED TO, READ BACK BY AND VERIFIED WITH: J LEDFORD [facility] 04/18/18 2353 JDW    Streptococcus species NOT DETECTED NOT DETECTED Final   Streptococcus agalactiae NOT DETECTED NOT DETECTED Final   Streptococcus pneumoniae NOT DETECTED NOT DETECTED Final   Streptococcus pyogenes NOT DETECTED NOT DETECTED Final   Acinetobacter baumannii NOT DETECTED NOT DETECTED Final   Enterobacteriaceae species NOT DETECTED NOT DETECTED Final   Enterobacter cloacae complex NOT DETECTED NOT DETECTED Final   Escherichia coli NOT DETECTED NOT DETECTED Final   Klebsiella oxytoca NOT DETECTED NOT DETECTED Final   Klebsiella pneumoniae NOT DETECTED NOT DETECTED Final   Proteus species NOT DETECTED NOT DETECTED Final   Serratia marcescens NOT DETECTED NOT DETECTED Final   Haemophilus influenzae NOT DETECTED NOT DETECTED Final   Neisseria meningitidis NOT DETECTED NOT DETECTED Final   Pseudomonas aeruginosa NOT DETECTED NOT DETECTED Final   Candida albicans NOT DETECTED NOT DETECTED Final   Candida glabrata NOT DETECTED NOT DETECTED Final   Candida krusei NOT DETECTED NOT DETECTED Final   Candida parapsilosis NOT DETECTED NOT DETECTED Final   Candida tropicalis NOT DETECTED NOT DETECTED Final    Comment: Performed at [facility], [address], [place], [place] [address]    Labs: CBC: Recent Labs  Lab 03/14/24 0649  WBC 3.4*  NEUTROABS 1.0*  HGB 13.3  HCT 42.1  MCV 88.3  PLT  192   Basic Metabolic Panel: Recent Labs  Lab 03/14/24 0649 03/15/24 0748 03/20/24 0527  NA 140 138  --   K 3.3* 3.8  --   CL 104 106  --   CO2 27 26  --   GLUCOSE 63* 104*  --   BUN 22 22  --   CREATININE 1.80* 1.81* 2.10*  CALCIUM  8.9 8.5*  --    Liver Function Tests: No results for input(s): "AST", "ALT", "ALKPHOS", "BILITOT", "PROT", "ALBUMIN" in the last 168 hours. CBG: Recent Labs  Lab 03/19/24 0814  03/19/24 1157 03/19/24 1604 03/19/24 2125 03/20/24 0634  GLUCAP 130* 151* 117* 155* 139*    Discharge time spent: 42 minutes.   Signed: Feliciana Horn, MD [facility] 03/20/2024

## 2024-03-20 NOTE — Progress Notes (Signed)
 Lower ext venous duplex  has been completed. Refer to [facility] under chart review to view preliminary results.   03/20/2024  10:15 AM Kayla Hunt, [place]

## 2024-03-20 NOTE — TOC Transition Note (Signed)
 Transition of Care [facility]) - Discharge Note   Patient Details  Name: Kayla Hunt MRN: [id number] Date of Birth: Jul 11, 1951  Transition [facility] CM/SW Contact:  Omie Bickers, RN Phone Number: 03/20/2024, 10:57 AM   Clinical Narrative:     Kayla Hunt w daughter, Kayla Hunt, she is at home and ready for patient to arrive.  [facility] called.  [employer] notified of DC  Nurse updated      Sherman Divers (Daughter) [phone]      Barriers to Discharge: Continued Medical Work up   Patient Goals and CMS Choice Patient states their goals for this hospitalization and ongoing recovery are:: patient unable to participate in goal setting, not fully oriented CMS Medicare.gov Compare Post Acute Care list provided to:: Patient Represenative (must comment) Choice offered to / list presented to : Adult Children      Discharge Placement                       Discharge Plan and Services Additional resources added to the After Visit Summary for       Post Acute Care Choice: Home Health          DME Arranged: Lightweight manual wheelchair with seat cushion, Other see comment Alen Amy lift) DME Agency: [employer] Date DME Agency Contacted: 03/15/24   Representative spoke with at DME Agency: Coral Der            Social Drivers of Health (SDOH) Interventions SDOH Screenings   Housing: Patient Unable To Answer (03/15/2024)  Transportation Needs: Patient Unable To Answer (03/15/2024)  Utilities: Patient Unable To Answer (03/15/2024)  Depression (PHQ2-9): Low Risk  (01/27/2021)  Social Connections: Patient Unable To Answer (03/15/2024)  Tobacco Use: High Risk (03/18/2024)     Readmission Risk Interventions     No data to display

## 2024-04-12 ENCOUNTER — Other Ambulatory Visit (HOSPITAL_COMMUNITY): Payer: Self-pay

## 2024-04-16 ENCOUNTER — Other Ambulatory Visit (HOSPITAL_COMMUNITY): Payer: Self-pay

## 2024-04-27 ENCOUNTER — Inpatient Hospital Stay (HOSPITAL_COMMUNITY)

## 2024-04-27 ENCOUNTER — Encounter (HOSPITAL_COMMUNITY): Payer: Self-pay | Admitting: *Deleted

## 2024-04-27 ENCOUNTER — Other Ambulatory Visit: Payer: Self-pay

## 2024-04-27 ENCOUNTER — Inpatient Hospital Stay (HOSPITAL_COMMUNITY)
Admission: EM | Admit: 2024-04-27 | Discharge: 2024-04-30 | DRG: 871 | Disposition: E | Attending: Internal Medicine | Admitting: Internal Medicine

## 2024-04-27 ENCOUNTER — Emergency Department (HOSPITAL_COMMUNITY)

## 2024-04-27 DIAGNOSIS — E039 Hypothyroidism, unspecified: Secondary | ICD-10-CM | POA: Diagnosis present

## 2024-04-27 DIAGNOSIS — Z515 Encounter for palliative care: Secondary | ICD-10-CM | POA: Diagnosis not present

## 2024-04-27 DIAGNOSIS — E860 Dehydration: Secondary | ICD-10-CM | POA: Diagnosis present

## 2024-04-27 DIAGNOSIS — Z66 Do not resuscitate: Secondary | ICD-10-CM | POA: Diagnosis present

## 2024-04-27 DIAGNOSIS — R7401 Elevation of levels of liver transaminase levels: Secondary | ICD-10-CM | POA: Diagnosis not present

## 2024-04-27 DIAGNOSIS — I700 Atherosclerosis of aorta: Secondary | ICD-10-CM | POA: Diagnosis present

## 2024-04-27 DIAGNOSIS — N179 Acute kidney failure, unspecified: Principal | ICD-10-CM | POA: Diagnosis present

## 2024-04-27 DIAGNOSIS — N184 Chronic kidney disease, stage 4 (severe): Secondary | ICD-10-CM | POA: Diagnosis present

## 2024-04-27 DIAGNOSIS — E1122 Type 2 diabetes mellitus with diabetic chronic kidney disease: Secondary | ICD-10-CM | POA: Diagnosis present

## 2024-04-27 DIAGNOSIS — Z1152 Encounter for screening for COVID-19: Secondary | ICD-10-CM | POA: Diagnosis not present

## 2024-04-27 DIAGNOSIS — N170 Acute kidney failure with tubular necrosis: Secondary | ICD-10-CM | POA: Diagnosis present

## 2024-04-27 DIAGNOSIS — Z79890 Hormone replacement therapy (postmenopausal): Secondary | ICD-10-CM

## 2024-04-27 DIAGNOSIS — M6282 Rhabdomyolysis: Secondary | ICD-10-CM | POA: Diagnosis present

## 2024-04-27 DIAGNOSIS — R7989 Other specified abnormal findings of blood chemistry: Secondary | ICD-10-CM

## 2024-04-27 DIAGNOSIS — Z8673 Personal history of transient ischemic attack (TIA), and cerebral infarction without residual deficits: Secondary | ICD-10-CM

## 2024-04-27 DIAGNOSIS — R933 Abnormal findings on diagnostic imaging of other parts of digestive tract: Secondary | ICD-10-CM

## 2024-04-27 DIAGNOSIS — E8809 Other disorders of plasma-protein metabolism, not elsewhere classified: Secondary | ICD-10-CM | POA: Diagnosis present

## 2024-04-27 DIAGNOSIS — Z7982 Long term (current) use of aspirin: Secondary | ICD-10-CM

## 2024-04-27 DIAGNOSIS — T466X5A Adverse effect of antihyperlipidemic and antiarteriosclerotic drugs, initial encounter: Secondary | ICD-10-CM | POA: Diagnosis present

## 2024-04-27 DIAGNOSIS — I5032 Chronic diastolic (congestive) heart failure: Secondary | ICD-10-CM | POA: Diagnosis present

## 2024-04-27 DIAGNOSIS — I130 Hypertensive heart and chronic kidney disease with heart failure and stage 1 through stage 4 chronic kidney disease, or unspecified chronic kidney disease: Secondary | ICD-10-CM | POA: Diagnosis present

## 2024-04-27 DIAGNOSIS — G9340 Encephalopathy, unspecified: Secondary | ICD-10-CM | POA: Diagnosis not present

## 2024-04-27 DIAGNOSIS — I2510 Atherosclerotic heart disease of native coronary artery without angina pectoris: Secondary | ICD-10-CM | POA: Diagnosis present

## 2024-04-27 DIAGNOSIS — N185 Chronic kidney disease, stage 5: Secondary | ICD-10-CM | POA: Diagnosis not present

## 2024-04-27 DIAGNOSIS — K828 Other specified diseases of gallbladder: Secondary | ICD-10-CM | POA: Diagnosis present

## 2024-04-27 DIAGNOSIS — E11649 Type 2 diabetes mellitus with hypoglycemia without coma: Secondary | ICD-10-CM | POA: Diagnosis present

## 2024-04-27 DIAGNOSIS — G9341 Metabolic encephalopathy: Secondary | ICD-10-CM | POA: Diagnosis not present

## 2024-04-27 DIAGNOSIS — A419 Sepsis, unspecified organism: Secondary | ICD-10-CM | POA: Diagnosis present

## 2024-04-27 DIAGNOSIS — A0811 Acute gastroenteropathy due to Norwalk agent: Secondary | ICD-10-CM | POA: Diagnosis present

## 2024-04-27 DIAGNOSIS — E785 Hyperlipidemia, unspecified: Secondary | ICD-10-CM | POA: Diagnosis present

## 2024-04-27 LAB — C DIFFICILE QUICK SCREEN W PCR REFLEX
C Diff antigen: NEGATIVE
C Diff interpretation: NOT DETECTED
C Diff toxin: NEGATIVE

## 2024-04-27 LAB — CBC WITH DIFFERENTIAL/PLATELET
Abs Immature Granulocytes: 0.27 10*3/uL — ABNORMAL HIGH (ref 0.00–0.07)
Abs Immature Granulocytes: 0.31 10*3/uL — ABNORMAL HIGH (ref 0.00–0.07)
Basophils Absolute: 0.1 10*3/uL (ref 0.0–0.1)
Basophils Absolute: 0.1 10*3/uL (ref 0.0–0.1)
Basophils Relative: 0 %
Basophils Relative: 0 %
Eosinophils Absolute: 0 10*3/uL (ref 0.0–0.5)
Eosinophils Absolute: 0 10*3/uL (ref 0.0–0.5)
Eosinophils Relative: 0 %
Eosinophils Relative: 0 %
HCT: 33.1 % — ABNORMAL LOW (ref 36.0–46.0)
HCT: 29.5 % — ABNORMAL LOW (ref 36.0–46.0)
Hemoglobin: 11 g/dL — ABNORMAL LOW (ref 12.0–15.0)
Hemoglobin: 9.8 g/dL — ABNORMAL LOW (ref 12.0–15.0)
Immature Granulocytes: 1 %
Immature Granulocytes: 1 %
Lymphocytes Relative: 3 %
Lymphocytes Relative: 4 %
Lymphs Abs: 1.1 10*3/uL (ref 0.7–4.0)
Lymphs Abs: 1.2 10*3/uL (ref 0.7–4.0)
MCH: 28.2 pg (ref 26.0–34.0)
MCH: 28.5 pg (ref 26.0–34.0)
MCHC: 33.2 g/dL (ref 30.0–36.0)
MCHC: 33.2 g/dL (ref 30.0–36.0)
MCV: 84.9 fL (ref 80.0–100.0)
MCV: 85.8 fL (ref 80.0–100.0)
Monocytes Absolute: 1.2 10*3/uL — ABNORMAL HIGH (ref 0.1–1.0)
Monocytes Absolute: 1.3 10*3/uL — ABNORMAL HIGH (ref 0.1–1.0)
Monocytes Relative: 4 %
Monocytes Relative: 5 %
Neutro Abs: 29.4 10*3/uL — ABNORMAL HIGH (ref 1.7–7.7)
Neutro Abs: 26.2 10*3/uL — ABNORMAL HIGH (ref 1.7–7.7)
Neutrophils Relative %: 92 %
Neutrophils Relative %: 90 %
Platelets: 171 10*3/uL (ref 150–400)
Platelets: 139 10*3/uL — ABNORMAL LOW (ref 150–400)
RBC: 3.9 MIL/uL (ref 3.87–5.11)
RBC: 3.44 MIL/uL — ABNORMAL LOW (ref 3.87–5.11)
RDW: 15.2 % (ref 11.5–15.5)
RDW: 15.2 % (ref 11.5–15.5)
WBC: 32.1 10*3/uL — ABNORMAL HIGH (ref 4.0–10.5)
WBC: 29.1 10*3/uL — ABNORMAL HIGH (ref 4.0–10.5)
nRBC: 0 % (ref 0.0–0.2)
nRBC: 0 % (ref 0.0–0.2)

## 2024-04-27 LAB — I-STAT CG4 LACTIC ACID, ED
Lactic Acid, Venous: 2.9 mmol/L (ref 0.5–1.9)
Lactic Acid, Venous: 1.5 mmol/L (ref 0.5–1.9)

## 2024-04-27 LAB — URINALYSIS, W/ REFLEX TO CULTURE (INFECTION SUSPECTED)
Bilirubin Urine: NEGATIVE
Glucose, UA: 50 mg/dL — AB
Ketones, ur: NEGATIVE mg/dL
Leukocytes,Ua: NEGATIVE
Nitrite: NEGATIVE
Protein, ur: >=300 mg/dL — AB
Specific Gravity, Urine: 1.022 (ref 1.005–1.030)
pH: 5 (ref 5.0–8.0)

## 2024-04-27 LAB — COMPREHENSIVE METABOLIC PANEL WITH GFR
ALT: 583 U/L — ABNORMAL HIGH (ref 0–44)
ALT: 518 U/L — ABNORMAL HIGH (ref 0–44)
AST: 2035 U/L — ABNORMAL HIGH (ref 15–41)
AST: 1871 U/L — ABNORMAL HIGH (ref 15–41)
Albumin: 2 g/dL — ABNORMAL LOW (ref 3.5–5.0)
Albumin: 1.8 g/dL — ABNORMAL LOW (ref 3.5–5.0)
Alkaline Phosphatase: 241 U/L — ABNORMAL HIGH (ref 38–126)
Alkaline Phosphatase: 214 U/L — ABNORMAL HIGH (ref 38–126)
Anion gap: 20 — ABNORMAL HIGH (ref 5–15)
Anion gap: 18 — ABNORMAL HIGH (ref 5–15)
BUN: 44 mg/dL — ABNORMAL HIGH (ref 8–23)
BUN: 44 mg/dL — ABNORMAL HIGH (ref 8–23)
CO2: 21 mmol/L — ABNORMAL LOW (ref 22–32)
CO2: 22 mmol/L (ref 22–32)
Calcium: 7.3 mg/dL — ABNORMAL LOW (ref 8.9–10.3)
Calcium: 6.8 mg/dL — ABNORMAL LOW (ref 8.9–10.3)
Chloride: 91 mmol/L — ABNORMAL LOW (ref 98–111)
Chloride: 93 mmol/L — ABNORMAL LOW (ref 98–111)
Creatinine, Ser: 7.35 mg/dL — ABNORMAL HIGH (ref 0.44–1.00)
Creatinine, Ser: 6.76 mg/dL — ABNORMAL HIGH (ref 0.44–1.00)
GFR, Estimated: 6 mL/min — ABNORMAL LOW (ref >60)
GFR, Estimated: 6 mL/min — ABNORMAL LOW (ref >60)
Glucose, Bld: 71 mg/dL (ref 70–99)
Glucose, Bld: 58 mg/dL — ABNORMAL LOW (ref 70–99)
Potassium: 4.5 mmol/L (ref 3.5–5.1)
Potassium: 4.2 mmol/L (ref 3.5–5.1)
Sodium: 132 mmol/L — ABNORMAL LOW (ref 135–145)
Sodium: 133 mmol/L — ABNORMAL LOW (ref 135–145)
Total Bilirubin: 2 mg/dL — ABNORMAL HIGH (ref 0.0–1.2)
Total Bilirubin: 1.6 mg/dL — ABNORMAL HIGH (ref 0.0–1.2)
Total Protein: 6.5 g/dL (ref 6.5–8.1)
Total Protein: 6.2 g/dL — ABNORMAL LOW (ref 6.5–8.1)

## 2024-04-27 LAB — HEPATITIS PANEL, ACUTE
HCV Ab: NONREACTIVE
Hep A IgM: NONREACTIVE
Hep B C IgM: NONREACTIVE
Hepatitis B Surface Ag: NONREACTIVE

## 2024-04-27 LAB — PROTIME-INR
INR: 1.9 — ABNORMAL HIGH (ref 0.8–1.2)
Prothrombin Time: 22.8 s — ABNORMAL HIGH (ref 11.4–15.2)

## 2024-04-27 LAB — RAPID URINE DRUG SCREEN, HOSP PERFORMED
Amphetamines: NOT DETECTED
Barbiturates: NOT DETECTED
Benzodiazepines: NOT DETECTED
Cocaine: NOT DETECTED
Opiates: NOT DETECTED
Tetrahydrocannabinol: NOT DETECTED

## 2024-04-27 LAB — RESP PANEL BY RT-PCR (RSV, FLU A&B, COVID)  RVPGX2
Influenza A by PCR: NEGATIVE
Influenza B by PCR: NEGATIVE
Resp Syncytial Virus by PCR: NEGATIVE
SARS Coronavirus 2 by RT PCR: NEGATIVE

## 2024-04-27 LAB — LIPASE, BLOOD: Lipase: 124 U/L — ABNORMAL HIGH (ref 11–51)

## 2024-04-27 LAB — MAGNESIUM
Magnesium: 2.3 mg/dL (ref 1.7–2.4)
Magnesium: 2.1 mg/dL (ref 1.7–2.4)

## 2024-04-27 LAB — CBG MONITORING, ED
Glucose-Capillary: 73 mg/dL (ref 70–99)
Glucose-Capillary: 55 mg/dL — ABNORMAL LOW (ref 70–99)
Glucose-Capillary: 49 mg/dL — ABNORMAL LOW (ref 70–99)

## 2024-04-27 LAB — ACETAMINOPHEN LEVEL: Acetaminophen (Tylenol), Serum: <10 ug/mL — ABNORMAL LOW (ref 10–30)

## 2024-04-27 LAB — PHOSPHORUS: Phosphorus: 4.2 mg/dL (ref 2.5–4.6)

## 2024-04-27 LAB — PROCALCITONIN: Procalcitonin: 7.47 ng/mL

## 2024-04-27 MED ORDER — SODIUM CHLORIDE 0.9 % IV BOLUS
1000.0000 mL | Freq: Once | INTRAVENOUS | Status: AC
  Administered 2024-04-27: 1000 mL via INTRAVENOUS

## 2024-04-27 MED ORDER — PIPERACILLIN-TAZOBACTAM 3.375 G IVPB 30 MIN
3.3750 g | Freq: Once | INTRAVENOUS | Status: AC
  Administered 2024-04-27: 3.375 g via INTRAVENOUS
  Filled 2024-04-27: qty 50

## 2024-04-27 MED ORDER — OXYCODONE HCL 5 MG PO TABS: 5.0000 mg | ORAL_TABLET | ORAL | Status: DC | PRN

## 2024-04-27 MED ORDER — ONDANSETRON HCL 4 MG PO TABS: 4.0000 mg | ORAL_TABLET | Freq: Four times a day (QID) | ORAL | Status: DC | PRN

## 2024-04-27 MED ORDER — KCL IN DEXTROSE-NACL 20-5-0.9 MEQ/L-%-% IV SOLN
INTRAVENOUS | Status: DC
  Filled 2024-04-27 (×2): qty 1000

## 2024-04-27 MED ORDER — VITAMIN K1 10 MG/ML IJ SOLN
10.0000 mg | Freq: Once | INTRAVENOUS | Status: AC
  Administered 2024-04-27: 10 mg via INTRAVENOUS
  Filled 2024-04-27 (×2): qty 1

## 2024-04-27 MED ORDER — HYDRALAZINE HCL 20 MG/ML IJ SOLN: 10.0000 mg | Freq: Four times a day (QID) | INTRAMUSCULAR | Status: DC | PRN

## 2024-04-27 MED ORDER — ACETAMINOPHEN 325 MG PO TABS
650.0000 mg | ORAL_TABLET | Freq: Four times a day (QID) | ORAL | Status: DC | PRN
  Administered 2024-04-29: 650 mg via ORAL
  Filled 2024-04-27: qty 2

## 2024-04-27 MED ORDER — INSULIN ASPART 100 UNIT/ML IJ SOLN: 0.0000 [IU] | Freq: Every day | INTRAMUSCULAR | Status: DC

## 2024-04-27 MED ORDER — SODIUM CHLORIDE 0.9 % IV SOLN: INTRAVENOUS | Status: DC

## 2024-04-27 MED ORDER — HEPARIN SODIUM (PORCINE) 5000 UNIT/ML IJ SOLN
5000.0000 [IU] | Freq: Three times a day (TID) | INTRAMUSCULAR | Status: DC
Start: 2024-04-27 — End: 2024-04-30
  Administered 2024-04-27 – 2024-04-29 (×5): 5000 [IU] via SUBCUTANEOUS
  Filled 2024-04-27 (×5): qty 1

## 2024-04-27 MED ORDER — SODIUM CHLORIDE 0.9% FLUSH
3.0000 mL | Freq: Two times a day (BID) | INTRAVENOUS | Status: DC
  Administered 2024-04-27 – 2024-04-28 (×2): 3 mL via INTRAVENOUS

## 2024-04-27 MED ORDER — VANCOMYCIN VARIABLE DOSE PER UNSTABLE RENAL FUNCTION (PHARMACIST DOSING): Status: DC

## 2024-04-27 MED ORDER — SODIUM CHLORIDE 0.9 % IV BOLUS: 1000.0000 mL | Freq: Once | INTRAVENOUS | Status: DC

## 2024-04-27 MED ORDER — PIPERACILLIN-TAZOBACTAM IN DEX 2-0.25 GM/50ML IV SOLN
2.2500 g | Freq: Three times a day (TID) | INTRAVENOUS | Status: DC
  Administered 2024-04-28 – 2024-04-29 (×5): 2.25 g via INTRAVENOUS
  Filled 2024-04-27 (×9): qty 50

## 2024-04-27 MED ORDER — MORPHINE SULFATE (PF) 2 MG/ML IV SOLN
2.0000 mg | Freq: Once | INTRAVENOUS | Status: AC
  Administered 2024-04-27: 2 mg via INTRAVENOUS
  Filled 2024-04-27: qty 1

## 2024-04-27 MED ORDER — ACETAMINOPHEN 650 MG RE SUPP: 650.0000 mg | Freq: Four times a day (QID) | RECTAL | Status: DC | PRN

## 2024-04-27 MED ORDER — FENTANYL CITRATE PF 50 MCG/ML IJ SOSY: 12.5000 ug | PREFILLED_SYRINGE | INTRAMUSCULAR | Status: DC | PRN

## 2024-04-27 MED ORDER — ONDANSETRON HCL 4 MG/2ML IJ SOLN
4.0000 mg | Freq: Once | INTRAMUSCULAR | Status: AC
  Administered 2024-04-27: 4 mg via INTRAVENOUS
  Filled 2024-04-27: qty 2

## 2024-04-27 MED ORDER — LEVOTHYROXINE SODIUM 100 MCG PO TABS
100.0000 ug | ORAL_TABLET | Freq: Every day | ORAL | Status: DC
  Administered 2024-04-28: 100 ug via ORAL
  Filled 2024-04-27: qty 1

## 2024-04-27 MED ORDER — VANCOMYCIN HCL 1250 MG/250ML IV SOLN
1250.0000 mg | Freq: Once | INTRAVENOUS | Status: AC
  Administered 2024-04-27: 1250 mg via INTRAVENOUS
  Filled 2024-04-27: qty 250

## 2024-04-27 MED ORDER — ONDANSETRON HCL 4 MG/2ML IJ SOLN: 4.0000 mg | Freq: Four times a day (QID) | INTRAMUSCULAR | Status: DC | PRN

## 2024-04-27 MED ORDER — INSULIN ASPART 100 UNIT/ML IJ SOLN
0.0000 [IU] | Freq: Three times a day (TID) | INTRAMUSCULAR | Status: DC
  Administered 2024-04-28: 2 [IU] via SUBCUTANEOUS
  Administered 2024-04-28: 3 [IU] via SUBCUTANEOUS
  Administered 2024-04-28: 1 [IU] via SUBCUTANEOUS

## 2024-04-27 MED ORDER — CHLORHEXIDINE GLUCONATE CLOTH 2 % EX PADS
6.0000 | MEDICATED_PAD | Freq: Every day | CUTANEOUS | Status: DC
  Administered 2024-04-28 – 2024-04-29 (×2): 6 via TOPICAL

## 2024-04-27 NOTE — ED Provider Notes (Signed)
 Care of the patient assumed at signout.  We are awaiting CT, repeat lactic.  Repeat lactate reassuring, CT with diffuse areas of inflammation, possible colitis, enteritis, gastritis.  No perforation, no pneumoperitoneum.  On exam patient denies complaints, acknowledges weakness, denies focality, is agreeable to staying in the hospital.   Garrick Charleston, MD 04/27/24 [phone]

## 2024-04-27 NOTE — ED Notes (Signed)
 Pt has low BG of 49, pt started on d5 NS @ 100 mL/hr. Pt is awake, alert and oriented. Need to recheck BG.

## 2024-04-27 NOTE — ED Notes (Signed)
 Patient transported to CT

## 2024-04-27 NOTE — ED Provider Notes (Signed)
 Kayla Hunt EMERGENCY DEPARTMENT AT [facility] Provider Note   CSN: [id number] Arrival date & time: 04/27/24  1111     Patient presents with: Weakness   Kayla Hunt is a 73 y.o. female.   73 yo F with a chief complaints of nausea vomiting and diarrhea.  Going on for about a week.  Patient is confused about the situation and is not able to provide much history.  She denies abdominal pain.  Denies chest pain denies cough congestion or fever.   Weakness      Prior to Admission medications   Medication Sig Start Date End Date Taking? Authorizing Provider  allopurinol  (ZYLOPRIM ) 100 MG tablet TAKE 1 TABLET(100 MG) BY MOUTH DAILY Patient taking differently: Take 100 mg by mouth in the morning. 09/26/20   [name], NP  aspirin  81 MG chewable tablet Chew 81 mg by mouth in the morning.    [provider]  atorvastatin  (LIPITOR ) 80 MG tablet Take 1 tablet (80 mg total) by mouth daily. 03/21/24   Hunt, Vijaya, MD  bismuth subsalicylate (PEPTO BISMOL) 262 MG/15ML suspension Take 30 mLs by mouth as needed for indigestion or diarrhea or loose stools.    [provider]  Blood Glucose Monitoring Suppl (TRUE METRIX AIR GLUCOSE METER) DEVI 1 Bag by Does not apply route 3 (three) times daily as needed. 10/09/19   [name], NP  Blood Pressure Monitoring KIT 1 kit by Does not apply route 3 (three) times a week. 12/16/19   [name], NP  cetirizine (ZYRTEC) 10 MG chewable tablet Chew 10 mg by mouth in the morning.    [provider]  feeding supplement (ENSURE ENLIVE / ENSURE PLUS) LIQD Take 237 mLs by mouth 2 (two) times daily between meals. 03/20/24   Kayla Labella, MD  glucose blood test strip Check blood sugar once daily 09/11/19   [name], NP  hydrALAZINE  (APRESOLINE ) 50 MG tablet Take 1 tablet (50 mg total) by mouth 3 (three) times daily. 03/20/24   Hunt, Vijaya, MD  levothyroxine  (SYNTHROID ) 100 MCG tablet Take  100 mcg by mouth daily before breakfast.    [provider]  losartan -hydrochlorothiazide  (HYZAAR) 50-12.5 MG tablet Take 1 tablet by mouth in the morning. 03/23/21   [provider]  metoprolol  tartrate (LOPRESSOR ) 50 MG tablet TAKE 1 TABLET(50 MG) BY MOUTH TWICE DAILY Patient taking differently: Take 50 mg by mouth 2 (two) times daily. 09/26/20   [name], NP  TRUEplus Lancets 26G MISC 1 Bottle by Does not apply route daily. Test blood sugar once daily 09/11/19   [name], NP    Allergies: Patient has no known allergies.    Review of Systems  Neurological:  Positive for weakness.    Updated Vital Signs BP 122/74   Pulse 98   Temp 97.6 F (36.4 C) (Oral)   Resp (!) 22   Ht 5' 1 (1.549 m)   Wt 66.5 kg   SpO2 100%   BMI 27.70 kg/m   Physical Exam Vitals and nursing note reviewed.  Constitutional:      General: She is not in acute distress.    Appearance: She is well-developed. She is not diaphoretic.  HENT:     Head: Normocephalic and atraumatic.   Eyes:     Pupils: Pupils are equal, round, and reactive to light.    Cardiovascular:     Rate and Rhythm: Normal rate and regular rhythm.  Heart sounds: No murmur heard.    No friction rub. No gallop.  Pulmonary:     Effort: Pulmonary effort is normal.     Breath sounds: No wheezing or rales.  Abdominal:     General: There is no distension.     Palpations: Abdomen is soft.     Tenderness: There is no abdominal tenderness.   Musculoskeletal:        General: No tenderness.     Cervical back: Normal range of motion and neck supple.   Skin:    General: Skin is warm and dry.   Neurological:     Mental Status: She is alert and oriented to person, place, and time.   Psychiatric:        Behavior: Behavior normal.     (all labs ordered are listed, but only abnormal results are displayed) Labs Reviewed  COMPREHENSIVE METABOLIC PANEL WITH GFR - Abnormal; Notable for the  following components:      Result Value   Sodium 132 (*)    Chloride 91 (*)    CO2 21 (*)    BUN 44 (*)    Creatinine, Ser 7.35 (*)    Calcium  7.3 (*)    Albumin 2.0 (*)    AST 2,035 (*)    ALT 583 (*)    Alkaline Phosphatase 241 (*)    Total Bilirubin 2.0 (*)    GFR, Estimated 6 (*)    Anion gap 20 (*)    All other components within normal limits  CBC WITH DIFFERENTIAL/PLATELET - Abnormal; Notable for the following components:   WBC 32.1 (*)    Hemoglobin 11.0 (*)    HCT 33.1 (*)    Neutro Abs 29.4 (*)    Monocytes Absolute 1.2 (*)    Abs Immature Granulocytes 0.27 (*)    All other components within normal limits  LIPASE, BLOOD - Abnormal; Notable for the following components:   Lipase 124 (*)    All other components within normal limits  I-STAT CG4 LACTIC ACID, ED - Abnormal; Notable for the following components:   Lactic Acid, Venous 2.9 (*)    All other components within normal limits  MAGNESIUM  URINALYSIS, W/ REFLEX TO CULTURE (INFECTION SUSPECTED)  PROTIME-INR  HEPATITIS PANEL, ACUTE  ACETAMINOPHEN  LEVEL  CBG MONITORING, ED  I-STAT CG4 LACTIC ACID, ED    EKG: None  Radiology: No results found.   Procedures   Medications Ordered in the ED  vancomycin  (VANCOREADY) IVPB 1250 mg/250 mL (has no administration in time range)  piperacillin-tazobactam (ZOSYN) IVPB 3.375 g (has no administration in time range)  sodium chloride  0.9 % bolus 1,000 mL (has no administration in time range)  sodium chloride  0.9 % bolus 1,000 mL (0 mLs Intravenous Stopped 04/27/24 1422)  ondansetron  (ZOFRAN ) injection 4 mg (4 mg Intravenous Given 04/27/24 1238)  morphine (PF) 2 MG/ML injection 2 mg (2 mg Intravenous Given 04/27/24 1241)                                    Medical Decision Making Amount and/or Complexity of Data Reviewed Labs: ordered. Radiology: ordered.  Risk Prescription drug management.   73 yo F with a chief complaints of nausea vomiting and diarrhea.   Reportedly going on for about a week.  Will perform a laboratory evaluation bolus of IV fluids.    LFT elevated. Leukocytosis.  AKI.    Signed  out to Dr. Garrick, please see their note for further details of care in the ED.  The patients results and plan were reviewed and discussed.   Any x-rays performed were independently reviewed by myself.   Differential diagnosis were considered with the presenting HPI.  Medications  vancomycin  (VANCOREADY) IVPB 1250 mg/250 mL (has no administration in time range)  piperacillin-tazobactam (ZOSYN) IVPB 3.375 g (has no administration in time range)  sodium chloride  0.9 % bolus 1,000 mL (has no administration in time range)  sodium chloride  0.9 % bolus 1,000 mL (0 mLs Intravenous Stopped 04/27/24 1422)  ondansetron  (ZOFRAN ) injection 4 mg (4 mg Intravenous Given 04/27/24 1238)  morphine (PF) 2 MG/ML injection 2 mg (2 mg Intravenous Given 04/27/24 1241)    Vitals:   04/27/24 1330 04/27/24 1400 04/27/24 1500 04/27/24 1531  BP: 131/61 (!) 131/90 122/74   Pulse: 100 (!) 102 98   Resp: (!) 22 (!) 21 (!) 22   Temp:    97.6 F (36.4 C)  TempSrc:    Oral  SpO2: 100% 100% 100%   Weight:      Height:        Final diagnoses:  Acute renal failure, unspecified acute renal failure type [facility])    Admission/ observation were discussed with the admitting physician, patient and/or family and they are comfortable with the plan.       Final diagnoses:  Acute renal failure, unspecified acute renal failure type [facility]    ED Discharge Orders     None          Emil Share, DO 04/27/24 1533

## 2024-04-27 NOTE — ED Triage Notes (Signed)
 Family called EMS because pt could not transfer from bed to bedside commode, even with assistance, no urine output since yesterday, diarrhea x 1 week.  Family also c/o hallucinations.    Pt discharged from hospital 3 weeks ago after tx for stroke.  Since then, started on antibiotics 10 days ago for oral abscesses.  Vs:  Cbg 69 Bp 110/70 Hr 114 ETCO2 20 RR 28  Given 100 LR by EMS.

## 2024-04-27 NOTE — Progress Notes (Signed)
 Pharmacy Antibiotic Note  Kayla Hunt is a 73 y.o. female admitted on 04/27/2024 with sepsis 2/2 possible intra-abdominal infection. Pt noted to be in significant AKI. Pharmacy has been consulted for vancomycin  dosing.  Plan: Vancomycin  dose per levels with unstable renal function F/u renal function, need for RRT, infectious work up and narrow as able  Height: 5' 1 (154.9 cm) Weight: 66.5 kg (146 lb 9.7 oz) IBW/kg (Calculated) : 47.8  Temp (24hrs), Avg:97.8 F (36.6 C), Min:97.6 F (36.4 C), Max:98 F (36.7 C)  Recent Labs  Lab 04/27/24 1224 04/27/24 1237 04/27/24 1507 04/27/24 1544 04/27/24 1815  WBC 32.1*  --   --   --  29.1*  CREATININE 7.35*  --  6.76*  --   --   LATICACIDVEN  --  2.9*  --  1.5  --     Estimated Creatinine Clearance: 6.8 mL/min (A) (by C-G formula based on SCr of 6.76 mg/dL (H)).    No Known Allergies  Antimicrobials this admission: Zosyn 6/28 > Vancomycin  6/28 >  Microbiology results: 6/28 resp panel: neg 6/28 cdiff: neg 6/28 ucx: 6/28 GI pcr:  Thank you for allowing pharmacy to be a part of this patient's care.  Kayla Hunt 04/27/2024 8:10 PM

## 2024-04-27 NOTE — H&P (Signed)
 History and Physical    Patient: Kayla Hunt FMW:[id number] DOB: Jan 31, 1951 DOA: 04/27/2024 DOS: the patient was seen and examined on 04/27/2024 [facility]CP: [facility]  Patient coming from: Home  Chief Complaint:  Chief Complaint  Patient presents with   Weakness   HPI: Kayla Hunt is a 73 y.o. female with medical history significant of but not limited to recent CVA about 3 weeks ago, chronic back pain, CKD stage IV, gout, depression, anxiety, hyperlipidemia, hypertension, chronic CHF who presents with extreme weakness with inability to do her ADLs and even transfer from the bed to bedside commode.  Patient is a very poor historian so most of the history is gathered from the patient's daughter and the ED physician and for the patient's daughter was hospitalized for a stroke in afterwards never really fully recovered.  States that she was doing well initially but then progressively started getting weaker and stopped really eating.  Patient's daughter states that she has started having some diarrhea as Monday and started having some nausea vomiting on Thursday.  Daughter noticed that she is extremely weak and cannot really even get out of the bed with assistance and also the patient started hallucinating.  Patient's daughter noticed that the patient is not urinating very much and appetite has been extremely poor.  Patient's daughter states that the diarrhea brown watery but they also had some black streaks in it yesterday so this was worked concerned her.  Given this the daughter decided to bring her to the hospital for further evaluation.  Patient denied any chest pain shortness of breath, lightheadedness or dizziness but was only asking for something to drink.  Daughter states that she has been nauseous and vomiting intermittently last few days.  Upon further evaluation in the ED she had a CT scan which showed concern for colitis.  TR[facility] was asked to admit this patient for further workup and  evaluation.  Review of Systems: As mentioned in the history of present illness. All other systems reviewed and are negative.  Past Medical History:  Diagnosis Date   Anemia    Anxiety    Arthritis    legs (04/12/2018)   CHF (congestive heart failure) (HCC)    Chronic back pain    Chronic edema    bilat LE's   CKD (chronic kidney disease), stage IV (HCC)    thelbert 04/12/2018   Depression    Gout    Hyperlipidemia    Hypertension    Hypothyroidism    Seizures (HCC) 05/05/2011   thelbert 05/05/2011   Type II diabetes mellitus (HCC)    Past Surgical History:  Procedure Laterality Date   NO PAST SURGERIES     TRANSESOPHAGEAL ECHOCARDIOGRAM (CATH LAB) N/A 03/18/2024   Procedure: TRANSESOPHAGEAL ECHOCARDIOGRAM;  Surgeon: Francyne Headland, MD;  Location: [facility];  Service: Cardiovascular;  Laterality: N/A;   Social History:  reports that she has never smoked. Her smokeless tobacco use includes snuff. She reports that she does not drink alcohol and does not use drugs.  No Known Allergies  Family History  Family history unknown: Yes    Prior to Admission medications   Medication Sig Start Date End Date Taking? Authorizing Provider  allopurinol  (ZYLOPRIM ) 100 MG tablet TAKE 1 TABLET(100 MG) BY MOUTH DAILY Patient taking differently: Take 100 mg by mouth in the morning. 09/26/20   [name], NP  aspirin  81 MG chewable tablet Chew 81 mg by mouth in the morning.  [provider]  atorvastatin  (LIPITOR ) 80 MG tablet Take 1 tablet (80 mg total) by mouth daily. 03/21/24   Akula, Vijaya, MD  bismuth subsalicylate (PEPTO BISMOL) 262 MG/15ML suspension Take 30 mLs by mouth as needed for indigestion or diarrhea or loose stools.    [provider]  Blood Glucose Monitoring Suppl (TRUE METRIX AIR GLUCOSE METER) DEVI 1 Bag by Does not apply route 3 (three) times daily as needed. 10/09/19   [name], NP  Blood Pressure Monitoring KIT 1 kit by Does not  apply route 3 (three) times a week. 12/16/19   [name], NP  cetirizine (ZYRTEC) 10 MG chewable tablet Chew 10 mg by mouth in the morning.    [provider]  feeding supplement (ENSURE ENLIVE / ENSURE PLUS) LIQD Take 237 mLs by mouth 2 (two) times daily between meals. 03/20/24   [name], MD  glucose blood test strip Check blood sugar once daily 09/11/19   [name], NP  hydrALAZINE  (APRESOLINE ) 50 MG tablet Take 1 tablet (50 mg total) by mouth 3 (three) times daily. 03/20/24   Akula, Vijaya, MD  levothyroxine  (SYNTHROID ) 100 MCG tablet Take 100 mcg by mouth daily before breakfast.    [provider]  losartan -hydrochlorothiazide  (HYZAAR) 50-12.5 MG tablet Take 1 tablet by mouth in the morning. 03/23/21   [provider]  metoprolol  tartrate (LOPRESSOR ) 50 MG tablet TAKE 1 TABLET(50 MG) BY MOUTH TWICE DAILY Patient taking differently: Take 50 mg by mouth 2 (two) times daily. 09/26/20   [name], NP  TRUEplus Lancets 26G MISC 1 Bottle by Does not apply route daily. Test blood sugar once daily 09/11/19   [name], NP    Physical Exam: Vitals:   04/27/24 1700 04/27/24 1730 04/27/24 1800 04/27/24 1940  BP: 133/64 117/88 116/73 124/68  Pulse: 92 89 93 93  Resp: 12 (!) 7 16 (!) 22  Temp:      TempSrc:      SpO2: 100% 98% 100% 96%  Weight:      Height:       Examination: Physical Exam:  Constitutional: WN/WD overweight chronically ill-appearing African-American female who appears very dehydrated and lethargic Respiratory: Diminished to auscultation bilaterally, no wheezing, rales, rhonchi or crackles. Normal respiratory effort and patient is not tachypenic. No accessory muscle use.  Unlabored breathing Cardiovascular: RRR, no murmurs / rubs / gallops. S1 and S2 auscultated.  No appreciable extremity edema Abdomen: Soft, non-tender, distended secondary body habitus. Bowel sounds positive.  GU: Deferred. Musculoskeletal:  No clubbing / cyanosis of digits/nails. No joint deformity upper and lower extremities.  Skin: No rashes, lesions, ulcers on the limited skin evaluation. No induration; Warm and dry.  Neurologic: CN 2-12 grossly intact with no focal deficits. Romberg sign cerebellar reflexes not assessed.  Psychiatric: She is awake and alert  Data Reviewed:  Recent Results (from the past 2160 hours)  CBG monitoring, ED     Status: Abnormal   Collection Time: 03/13/24 11:27 AM  Result Value Ref Range   Glucose-Capillary 127 (H) 70 - 99 mg/dL    Comment: Glucose reference range applies only to samples taken after fasting for at least 8 hours.  Protime-INR     Status: None   Collection Time: 03/13/24 11:32 AM  Result Value Ref Range   Prothrombin Time 13.4 11.4 - 15.2 seconds   INR 1.0 0.8 - 1.2    Comment: (NOTE) INR goal varies based on device and disease  states. Performed at [facility], [address] [place], [place] [address]   APTT     Status: None   Collection Time: 03/13/24 11:32 AM  Result Value Ref Range   aPTT 34 24 - 36 seconds    Comment: Performed at [facility], [address] [place], [place] [address]  CBC     Status: None   Collection Time: 03/13/24 11:32 AM  Result Value Ref Range   WBC 4.5 4.0 - 10.5 K/uL   RBC 4.69 3.87 - 5.11 MIL/uL   Hemoglobin 13.4 12.0 - 15.0 g/dL   HCT 56.8 63.9 - 53.9 %   MCV 91.9 80.0 - 100.0 fL   MCH 28.6 26.0 - 34.0 pg   MCHC 31.1 30.0 - 36.0 g/dL   RDW 84.6 88.4 - 84.4 %   Platelets 172 150 - 400 K/uL   nRBC 0.0 0.0 - 0.2 %    Comment: Performed at [facility], [address], [place], [place] [address]  Differential     Status: None   Collection Time: 03/13/24 11:32 AM  Result Value Ref Range   Neutrophils Relative % 46 %   Neutro Abs 2.1 1.7 - 7.7 K/uL   Lymphocytes Relative 43 %   Lymphs Abs 1.9 0.7 - 4.0 K/uL   Monocytes Relative 9 %   Monocytes Absolute 0.4 0.1 - 1.0 K/uL   Eosinophils Relative 1 %    Eosinophils Absolute 0.0 0.0 - 0.5 K/uL   Basophils Relative 1 %   Basophils Absolute 0.0 0.0 - 0.1 K/uL   Immature Granulocytes 0 %   Abs Immature Granulocytes 0.00 0.00 - 0.07 K/uL    Comment: Performed at [facility], [address], [place], [place] [address]  Comprehensive metabolic panel     Status: Abnormal   Collection Time: 03/13/24 11:32 AM  Result Value Ref Range   Sodium 140 135 - 145 mmol/L   Potassium 3.5 3.5 - 5.1 mmol/L   Chloride 104 98 - 111 mmol/L   CO2 24 22 - 32 mmol/L   Glucose, Bld 126 (H) 70 - 99 mg/dL    Comment: Glucose reference range applies only to samples taken after fasting for at least 8 hours.   BUN 25 (H) 8 - 23 mg/dL   Creatinine, Ser 8.05 (H) 0.44 - 1.00 mg/dL   Calcium  8.9 8.9 - 10.3 mg/dL   Total Protein 6.5 6.5 - 8.1 g/dL   Albumin 2.5 (L) 3.5 - 5.0 g/dL   AST 22 15 - 41 U/L   ALT 11 0 - 44 U/L   Alkaline Phosphatase 58 38 - 126 U/L   Total Bilirubin 0.5 0.0 - 1.2 mg/dL   GFR, Estimated 27 (L) >60 mL/min    Comment: (NOTE) Calculated using the CKD-EPI Creatinine Equation (2021)    Anion gap 12 5 - 15    Comment: Performed at [facility] Lab, [address] [place], [place] [address]  Ethanol     Status: None   Collection Time: 03/13/24 11:32 AM  Result Value Ref Range   Alcohol, Ethyl (B) <15 <15 mg/dL    Comment: Please note change in reference range. (NOTE) For medical purposes only. Performed at [facility], [address] [place], [place] [address]   I-stat chem 8, ED     Status: Abnormal   Collection Time: 03/13/24 11:34 AM  Result Value Ref Range   Sodium 140 135 -  145 mmol/L   Potassium 3.5 3.5 - 5.1 mmol/L   Chloride 104 98 - 111 mmol/L   BUN 27 (H) 8 - 23 mg/dL   Creatinine, Ser 8.09 (H) 0.44 - 1.00 mg/dL   Glucose, Bld 874 (H) 70 - 99 mg/dL    Comment: Glucose reference range applies only to samples taken after fasting for at least 8 hours.   Calcium , Ion 1.00 (L) 1.15 - 1.40 mmol/L   TCO2 26 22 -  32 mmol/L   Hemoglobin 13.9 12.0 - 15.0 g/dL   HCT 58.9 63.9 - 53.9 %  Urinalysis, Complete w Microscopic -Urine, Clean Catch     Status: Abnormal   Collection Time: 03/13/24  1:05 PM  Result Value Ref Range   Color, Urine YELLOW YELLOW   APPearance CLEAR CLEAR   Specific Gravity, Urine >1.046 (H) 1.005 - 1.030   pH 5.0 5.0 - 8.0   Glucose, UA NEGATIVE NEGATIVE mg/dL   Hgb urine dipstick NEGATIVE NEGATIVE   Bilirubin Urine NEGATIVE NEGATIVE   Ketones, ur NEGATIVE NEGATIVE mg/dL   Protein, ur >=699 (A) NEGATIVE mg/dL   Nitrite NEGATIVE NEGATIVE   Leukocytes,Ua NEGATIVE NEGATIVE   RBC / HPF 0-5 0 - 5 RBC/hpf   WBC, UA 0-5 0 - 5 WBC/hpf   Bacteria, UA NONE SEEN NONE SEEN   Squamous Epithelial / HPF 0-5 0 - 5 /HPF    Comment: Performed at [facility], [address], [place], [place] [address]  Ammonia     Status: None   Collection Time: 03/13/24  2:13 PM  Result Value Ref Range   Ammonia 13 9 - 35 umol/L    Comment: Performed at [facility], [address] [place], [place] [address]  I-Stat venous blood gas, [facility] ED, [facility], [facility])     Status: Abnormal   Collection Time: 03/13/24  2:20 PM  Result Value Ref Range   pH, Ven 7.398 7.25 - 7.43   pCO2, Ven 40.3 (L) 44 - 60 mmHg   pO2, Ven 40 32 - 45 mmHg   Bicarbonate 24.8 20.0 - 28.0 mmol/L   TCO2 26 22 - 32 mmol/L   O2 Saturation 75 %   Acid-Base Excess 0.0 0.0 - 2.0 mmol/L   Sodium 142 135 - 145 mmol/L   Potassium 3.1 (L) 3.5 - 5.1 mmol/L   Calcium , Ion 0.98 (L) 1.15 - 1.40 mmol/L   HCT 35.0 (L) 36.0 - 46.0 %   Hemoglobin 11.9 (L) 12.0 - 15.0 g/dL   Sample type VENOUS   Lipid panel     Status: Abnormal   Collection Time: 03/14/24  6:49 AM  Result Value Ref Range   Cholesterol 524 (H) 0 - 200 mg/dL   Triglycerides 887 <849 mg/dL   HDL 64 >59 mg/dL   Total CHOL/HDL Ratio 8.2 RATIO   VLDL 22 0 - 40 mg/dL   LDL Cholesterol 561 (H) 0 - 99 mg/dL    Comment:        Total Cholesterol/HDL:CHD Risk Coronary Heart  Disease Risk Table                     Men   Women  1/2 Average Risk   3.4   3.3  Average Risk       5.0   4.4  2 X Average Risk   9.6   7.1  3 X Average Risk  23.4   11.0  Use the calculated Patient Ratio above and the CHD Risk Table to determine the patient's CHD Risk.        ATP III CLASSIFICATION (LDL):  <100     mg/dL   Optimal  899-870  mg/dL   Near or Above                    Optimal  130-159  mg/dL   Borderline  839-810  mg/dL   High  >809     mg/dL   Very High Performed at [facility], [address] [place], [place] [address]   Hemoglobin A1c     Status: Abnormal   Collection Time: 03/14/24  6:49 AM  Result Value Ref Range   Hgb A1c MFr Bld 6.3 (H) 4.8 - 5.6 %    Comment: (NOTE) Pre diabetes:          5.7%-6.4%  Diabetes:              >6.4%  Glycemic control for   <7.0% adults with diabetes    Mean Plasma Glucose 134.11 mg/dL    Comment: Performed at [facility] Lab, [address] [place], [place] [address]  CBC with Differential/Platelet     Status: Abnormal   Collection Time: 03/14/24  6:49 AM  Result Value Ref Range   WBC 3.4 (L) 4.0 - 10.5 K/uL   RBC 4.77 3.87 - 5.11 MIL/uL   Hemoglobin 13.3 12.0 - 15.0 g/dL   HCT 57.8 63.9 - 53.9 %   MCV 88.3 80.0 - 100.0 fL   MCH 27.9 26.0 - 34.0 pg   MCHC 31.6 30.0 - 36.0 g/dL   RDW 84.3 (H) 88.4 - 84.4 %   Platelets 192 150 - 400 K/uL   nRBC 0.0 0.0 - 0.2 %   Neutrophils Relative % 30 %   Neutro Abs 1.0 (L) 1.7 - 7.7 K/uL   Lymphocytes Relative 59 %   Lymphs Abs 2.0 0.7 - 4.0 K/uL   Monocytes Relative 8 %   Monocytes Absolute 0.3 0.1 - 1.0 K/uL   Eosinophils Relative 2 %   Eosinophils Absolute 0.1 0.0 - 0.5 K/uL   Basophils Relative 1 %   Basophils Absolute 0.0 0.0 - 0.1 K/uL   Immature Granulocytes 0 %   Abs Immature Granulocytes 0.01 0.00 - 0.07 K/uL    Comment: Performed at [facility], [address], [place], [place] [address]  TSH     Status: Abnormal   Collection Time:  03/14/24  6:49 AM  Result Value Ref Range   TSH 95.872 (H) 0.350 - 4.500 uIU/mL    Comment: Performed by a 3rd Generation assay with a functional sensitivity of <=0.01 uIU/mL. Performed at [facility], [address] [place], [place] [address]   Ammonia     Status: None   Collection Time: 03/14/24  6:49 AM  Result Value Ref Range   Ammonia 19 9 - 35 umol/L    Comment: Performed at [facility], [address] [place], [place] [address]  Basic metabolic panel     Status: Abnormal   Collection Time: 03/14/24  6:49 AM  Result Value Ref Range   Sodium 140 135 - 145 mmol/L   Potassium 3.3 (L) 3.5 - 5.1 mmol/L   Chloride 104 98 - 111 mmol/L   CO2 27 22 - 32 mmol/L   Glucose, Bld 63 (L) 70 - 99 mg/dL  Comment: Glucose reference range applies only to samples taken after fasting for at least 8 hours.   BUN 22 8 - 23 mg/dL   Creatinine, Ser 8.19 (H) 0.44 - 1.00 mg/dL   Calcium  8.9 8.9 - 10.3 mg/dL   GFR, Estimated 30 (L) >60 mL/min    Comment: (NOTE) Calculated using the CKD-EPI Creatinine Equation (2021)    Anion gap 9 5 - 15    Comment: Performed at [facility], [address], [place], [place] [address]  HIV Antibody (routine testing w rflx)     Status: None   Collection Time: 03/14/24  6:49 AM  Result Value Ref Range   HIV Screen 4th Generation wRfx Non Reactive Non Reactive    Comment: Performed at [facility], [address] [place], [place] [address]  Glucose, capillary     Status: Abnormal   Collection Time: 03/14/24 12:44 PM  Result Value Ref Range   Glucose-Capillary 63 (L) 70 - 99 mg/dL    Comment: Glucose reference range applies only to samples taken after fasting for at least 8 hours.   Comment 1 Notify RN    Comment 2 Document in Chart   ECHOCARDIOGRAM COMPLETE     Status: None   Collection Time: 03/14/24  2:19 PM  Result Value Ref Range   BP 136/87 mmHg   Single Plane A2C EF 78.6 %   Single Plane A4C EF 61.7 %   Calc EF 71.8 %   S'  Lateral 2.70 cm   AR max vel 3.19 cm2   AV Area VTI 3.33 cm2   AV Mean grad 2.7 mmHg   AV Peak grad 5.5 mmHg   Ao pk vel 1.17 m/s   Area-P 1/2 3.21 cm2   AV Area mean vel 3.03 cm2   Est EF 60 - 65%   T4, free     Status: Abnormal   Collection Time: 03/14/24  2:28 PM  Result Value Ref Range   Free T4 0.32 (L) 0.61 - 1.12 ng/dL    Comment: (NOTE) Biotin ingestion may interfere with free T4 tests. If the results are inconsistent with the TSH level, previous test results, or the clinical presentation, then consider biotin interference. If needed, order repeat testing after stopping biotin. Performed at [facility] Lab, [address], [place], [place] [address]   T3, free     Status: Abnormal   Collection Time: 03/14/24  2:28 PM  Result Value Ref Range   T3, Free <0.3 (L) 2.0 - 4.4 pg/mL    Comment: (NOTE) Performed At: [facility] [address] [place], [place] [id number] Jennette Shorter MD Ey:[id number]   Vitamin B12     Status: None   Collection Time: 03/14/24  2:28 PM  Result Value Ref Range   Vitamin B-12 385 180 - 914 pg/mL    Comment: (NOTE) This assay is not validated for testing neonatal or myeloproliferative syndrome specimens for Vitamin B12 levels. Performed at [facility], [address], [place], [place] [address]   Vitamin B1     Status: None   Collection Time: 03/14/24  2:28 PM  Result Value Ref Range   Vitamin B1 (Thiamine) 85.6 66.5 - 200.0 nmol/L    Comment: (NOTE) This test was developed and its performance characteristics determined by [facility]. It has not been cleared or approved by the Food and Drug Administration. Performed At: [facility] [address] [place], [place] [id number] Jennette Shorter MD  Ey:[id number]   Folate     Status: None   Collection Time: 03/14/24  2:28 PM  Result Value Ref Range   Folate 11.8 >5.9 ng/mL    Comment: Performed at [facility], [address], [place], [place] [address]   Vitamin B6     Status: Abnormal   Collection Time: 03/14/24  2:28 PM  Result Value Ref Range   Vitamin B6 2.9 (L) 3.4 - 65.2 ug/L    Comment: (NOTE) This test was developed and its performance characteristics determined by [facility]. It has not been cleared or approved by the Food and Drug Administration.                             Deficiency:         <3.4                             Marginal:      3.4 - 5.1                             Adequate:           >5.1 Performed At: [facility] [address] [place], [place] [id number] Jennette Shorter MD Ey:[id number]   RPR     Status: None   Collection Time: 03/14/24  2:28 PM  Result Value Ref Range   RPR Ser Ql NON REACTIVE NON REACTIVE    Comment: Performed at [facility], [address], [place], [place] [address]  Glucose, capillary     Status: None   Collection Time: 03/14/24  2:48 PM  Result Value Ref Range   Glucose-Capillary 84 70 - 99 mg/dL    Comment: Glucose reference range applies only to samples taken after fasting for at least 8 hours.  Glucose, capillary     Status: Abnormal   Collection Time: 03/14/24  4:06 PM  Result Value Ref Range   Glucose-Capillary 105 (H) 70 - 99 mg/dL    Comment: Glucose reference range applies only to samples taken after fasting for at least 8 hours.   Comment 1 Notify RN    Comment 2 Document in Chart   Glucose, capillary     Status: Abnormal   Collection Time: 03/14/24  9:10 PM  Result Value Ref Range   Glucose-Capillary 133 (H) 70 - 99 mg/dL    Comment: Glucose reference range applies only to samples taken after fasting for at least 8 hours.   Comment 1 Notify RN    Comment 2 Document in Chart   Glucose, capillary     Status: None   Collection Time: 03/15/24  6:28 AM  Result Value Ref Range   Glucose-Capillary 96 70 - 99 mg/dL    Comment: Glucose reference range applies only to samples taken after fasting for at least 8 hours.   Comment 1 Notify RN    Comment 2 Document  in Chart   Basic metabolic panel     Status: Abnormal   Collection Time: 03/15/24  7:48 AM  Result Value Ref Range   Sodium 138 135 - 145 mmol/L   Potassium 3.8 3.5 - 5.1 mmol/L   Chloride 106 98 - 111 mmol/L   CO2 26 22 - 32 mmol/L   Glucose, Bld 104 (H) 70 - 99 mg/dL    Comment: Glucose  reference range applies only to samples taken after fasting for at least 8 hours.   BUN 22 8 - 23 mg/dL   Creatinine, Ser 8.18 (H) 0.44 - 1.00 mg/dL   Calcium  8.5 (L) 8.9 - 10.3 mg/dL   GFR, Estimated 30 (L) >60 mL/min    Comment: (NOTE) Calculated using the CKD-EPI Creatinine Equation (2021)    Anion gap 6 5 - 15    Comment: Performed at [facility], [address], [place], [place] [address]  Glucose, capillary     Status: Abnormal   Collection Time: 03/15/24 11:42 AM  Result Value Ref Range   Glucose-Capillary 120 (H) 70 - 99 mg/dL    Comment: Glucose reference range applies only to samples taken after fasting for at least 8 hours.  Glucose, capillary     Status: Abnormal   Collection Time: 03/15/24  4:23 PM  Result Value Ref Range   Glucose-Capillary 154 (H) 70 - 99 mg/dL    Comment: Glucose reference range applies only to samples taken after fasting for at least 8 hours.  Glucose, capillary     Status: None   Collection Time: 03/15/24  9:38 PM  Result Value Ref Range   Glucose-Capillary 92 70 - 99 mg/dL    Comment: Glucose reference range applies only to samples taken after fasting for at least 8 hours.   Comment 1 Notify RN    Comment 2 Document in Chart   Glucose, capillary     Status: None   Collection Time: 03/16/24  6:12 AM  Result Value Ref Range   Glucose-Capillary 75 70 - 99 mg/dL    Comment: Glucose reference range applies only to samples taken after fasting for at least 8 hours.   Comment 1 Notify RN    Comment 2 Document in Chart   Glucose, capillary     Status: Abnormal   Collection Time: 03/16/24 12:22 PM  Result Value Ref Range   Glucose-Capillary 117 (H) 70 -  99 mg/dL    Comment: Glucose reference range applies only to samples taken after fasting for at least 8 hours.  Glucose, capillary     Status: Abnormal   Collection Time: 03/16/24  4:11 PM  Result Value Ref Range   Glucose-Capillary 153 (H) 70 - 99 mg/dL    Comment: Glucose reference range applies only to samples taken after fasting for at least 8 hours.   Comment 1 Notify RN    Comment 2 Document in Chart   Glucose, capillary     Status: Abnormal   Collection Time: 03/16/24  9:14 PM  Result Value Ref Range   Glucose-Capillary 101 (H) 70 - 99 mg/dL    Comment: Glucose reference range applies only to samples taken after fasting for at least 8 hours.   Comment 1 Notify RN   Glucose, capillary     Status: Abnormal   Collection Time: 03/17/24  6:31 AM  Result Value Ref Range   Glucose-Capillary 117 (H) 70 - 99 mg/dL    Comment: Glucose reference range applies only to samples taken after fasting for at least 8 hours.   Comment 1 Notify RN   Glucose, capillary     Status: Abnormal   Collection Time: 03/17/24 12:20 PM  Result Value Ref Range   Glucose-Capillary 228 (H) 70 - 99 mg/dL    Comment: Glucose reference range applies only to samples taken after fasting for at least 8 hours.  Glucose, capillary     Status:  Abnormal   Collection Time: 03/17/24  4:50 PM  Result Value Ref Range   Glucose-Capillary 127 (H) 70 - 99 mg/dL    Comment: Glucose reference range applies only to samples taken after fasting for at least 8 hours.  Glucose, capillary     Status: Abnormal   Collection Time: 03/17/24  9:36 PM  Result Value Ref Range   Glucose-Capillary 123 (H) 70 - 99 mg/dL    Comment: Glucose reference range applies only to samples taken after fasting for at least 8 hours.   Comment 1 Notify RN    Comment 2 Document in Chart   Glucose, capillary     Status: None   Collection Time: 03/18/24  6:21 AM  Result Value Ref Range   Glucose-Capillary 83 70 - 99 mg/dL    Comment: Glucose reference  range applies only to samples taken after fasting for at least 8 hours.   Comment 1 Notify RN    Comment 2 Document in Chart   ECHO TEE     Status: None   Collection Time: 03/18/24 12:28 PM  Result Value Ref Range   Est EF 60 - 65%   Glucose, capillary     Status: None   Collection Time: 03/18/24  1:43 PM  Result Value Ref Range   Glucose-Capillary 85 70 - 99 mg/dL    Comment: Glucose reference range applies only to samples taken after fasting for at least 8 hours.   Comment 1 Notify RN   Glucose, capillary     Status: None   Collection Time: 03/18/24  4:00 PM  Result Value Ref Range   Glucose-Capillary 79 70 - 99 mg/dL    Comment: Glucose reference range applies only to samples taken after fasting for at least 8 hours.   Comment 1 Notify RN   Glucose, capillary     Status: None   Collection Time: 03/18/24  9:45 PM  Result Value Ref Range   Glucose-Capillary 74 70 - 99 mg/dL    Comment: Glucose reference range applies only to samples taken after fasting for at least 8 hours.  Glucose, capillary     Status: Abnormal   Collection Time: 03/19/24  6:35 AM  Result Value Ref Range   Glucose-Capillary 67 (L) 70 - 99 mg/dL    Comment: Glucose reference range applies only to samples taken after fasting for at least 8 hours.   Comment 1 Notify RN   Glucose, capillary     Status: Abnormal   Collection Time: 03/19/24  6:44 AM  Result Value Ref Range   Glucose-Capillary 68 (L) 70 - 99 mg/dL    Comment: Glucose reference range applies only to samples taken after fasting for at least 8 hours.   Comment 1 Notify RN   Glucose, capillary     Status: None   Collection Time: 03/19/24  6:52 AM  Result Value Ref Range   Glucose-Capillary 73 70 - 99 mg/dL    Comment: Glucose reference range applies only to samples taken after fasting for at least 8 hours.   Comment 1 Notify RN   Glucose, capillary     Status: Abnormal   Collection Time: 03/19/24  8:14 AM  Result Value Ref Range    Glucose-Capillary 130 (H) 70 - 99 mg/dL    Comment: Glucose reference range applies only to samples taken after fasting for at least 8 hours.   Comment 1 Notify RN    Comment 2 Document in Chart   Glucose,  capillary     Status: Abnormal   Collection Time: 03/19/24 11:57 AM  Result Value Ref Range   Glucose-Capillary 151 (H) 70 - 99 mg/dL    Comment: Glucose reference range applies only to samples taken after fasting for at least 8 hours.   Comment 1 Notify RN    Comment 2 Document in Chart   Glucose, capillary     Status: Abnormal   Collection Time: 03/19/24  4:04 PM  Result Value Ref Range   Glucose-Capillary 117 (H) 70 - 99 mg/dL    Comment: Glucose reference range applies only to samples taken after fasting for at least 8 hours.   Comment 1 Notify RN    Comment 2 Document in Chart   Glucose, capillary     Status: Abnormal   Collection Time: 03/19/24  9:25 PM  Result Value Ref Range   Glucose-Capillary 155 (H) 70 - 99 mg/dL    Comment: Glucose reference range applies only to samples taken after fasting for at least 8 hours.   Comment 1 Notify RN   Creatinine, serum     Status: Abnormal   Collection Time: 03/20/24  5:27 AM  Result Value Ref Range   Creatinine, Ser 2.10 (H) 0.44 - 1.00 mg/dL   GFR, Estimated 25 (L) >60 mL/min    Comment: (NOTE) Calculated using the CKD-EPI Creatinine Equation (2021) Performed at [facility], [address] [place], [place] [address]   Glucose, capillary     Status: Abnormal   Collection Time: 03/20/24  6:34 AM  Result Value Ref Range   Glucose-Capillary 139 (H) 70 - 99 mg/dL    Comment: Glucose reference range applies only to samples taken after fasting for at least 8 hours.   Comment 1 Notify RN   CBG monitoring, ED     Status: None   Collection Time: 04/27/24 11:26 AM  Result Value Ref Range   Glucose-Capillary 73 70 - 99 mg/dL    Comment: Glucose reference range applies only to samples taken after fasting for at least 8 hours.   Comprehensive metabolic panel     Status: Abnormal   Collection Time: 04/27/24 12:24 PM  Result Value Ref Range   Sodium 132 (L) 135 - 145 mmol/L   Potassium 4.5 3.5 - 5.1 mmol/L   Chloride 91 (L) 98 - 111 mmol/L   CO2 21 (L) 22 - 32 mmol/L   Glucose, Bld 71 70 - 99 mg/dL    Comment: Glucose reference range applies only to samples taken after fasting for at least 8 hours.   BUN 44 (H) 8 - 23 mg/dL   Creatinine, Ser 2.64 (H) 0.44 - 1.00 mg/dL   Calcium  7.3 (L) 8.9 - 10.3 mg/dL   Total Protein 6.5 6.5 - 8.1 g/dL   Albumin 2.0 (L) 3.5 - 5.0 g/dL   AST 7,964 (H) 15 - 41 U/L   ALT 583 (H) 0 - 44 U/L   Alkaline Phosphatase 241 (H) 38 - 126 U/L   Total Bilirubin 2.0 (H) 0.0 - 1.2 mg/dL   GFR, Estimated 6 (L) >60 mL/min    Comment: (NOTE) Calculated using the CKD-EPI Creatinine Equation (2021)    Anion gap 20 (H) 5 - 15    Comment: Performed at [facility], [address] [place], [place] [address]  CBC with Differential     Status: Abnormal   Collection Time: 04/27/24 12:24 PM  Result Value Ref Range   WBC 32.1 (H)  4.0 - 10.5 K/uL   RBC 3.90 3.87 - 5.11 MIL/uL   Hemoglobin 11.0 (L) 12.0 - 15.0 g/dL   HCT 66.8 (L) 63.9 - 53.9 %   MCV 84.9 80.0 - 100.0 fL   MCH 28.2 26.0 - 34.0 pg   MCHC 33.2 30.0 - 36.0 g/dL   RDW 84.7 88.4 - 84.4 %   Platelets 171 150 - 400 K/uL    Comment: REPEATED TO VERIFY   nRBC 0.0 0.0 - 0.2 %   Neutrophils Relative % 92 %   Neutro Abs 29.4 (H) 1.7 - 7.7 K/uL   Lymphocytes Relative 3 %   Lymphs Abs 1.1 0.7 - 4.0 K/uL   Monocytes Relative 4 %   Monocytes Absolute 1.2 (H) 0.1 - 1.0 K/uL   Eosinophils Relative 0 %   Eosinophils Absolute 0.0 0.0 - 0.5 K/uL   Basophils Relative 0 %   Basophils Absolute 0.1 0.0 - 0.1 K/uL   Immature Granulocytes 1 %   Abs Immature Granulocytes 0.27 (H) 0.00 - 0.07 K/uL    Comment: Performed at [facility], [address], [place], [place] [address]  Lipase, blood     Status: Abnormal   Collection Time:  04/27/24 12:24 PM  Result Value Ref Range   Lipase 124 (H) 11 - 51 U/L    Comment: Performed at [facility], [address] [place], [place] [address]  Magnesium     Status: None   Collection Time: 04/27/24 12:24 PM  Result Value Ref Range   Magnesium 2.3 1.7 - 2.4 mg/dL    Comment: Performed at [facility], [address], [place], [place] [address]  I-Stat Lactic Acid, ED     Status: Abnormal   Collection Time: 04/27/24 12:37 PM  Result Value Ref Range   Lactic Acid, Venous 2.9 (HH) 0.5 - 1.9 mmol/L   Comment NOTIFIED PHYSICIAN   Protime-INR     Status: Abnormal   Collection Time: 04/27/24  3:07 PM  Result Value Ref Range   Prothrombin Time 22.8 (H) 11.4 - 15.2 seconds   INR 1.9 (H) 0.8 - 1.2    Comment: (NOTE) INR goal varies based on device and disease states. Performed at [facility], [address] [place], [place] [address]   Hepatitis panel, acute     Status: None   Collection Time: 04/27/24  3:07 PM  Result Value Ref Range   Hepatitis B Surface Ag NON REACTIVE NON REACTIVE   HCV Ab NON REACTIVE NON REACTIVE    Comment: (NOTE) Nonreactive HCV antibody screen is consistent with no HCV infections,  unless recent infection is suspected or other evidence exists to indicate HCV infection.     Hep A IgM NON REACTIVE NON REACTIVE   Hep B C IgM NON REACTIVE NON REACTIVE    Comment: Performed at [facility], [address] [place], [place] [address]  Acetaminophen  level     Status: Abnormal   Collection Time: 04/27/24  3:07 PM  Result Value Ref Range   Acetaminophen  (Tylenol ), Serum <10 (L) 10 - 30 ug/mL    Comment: (NOTE) Therapeutic concentrations vary significantly. A range of 10-30 ug/mL  may be an effective concentration for many patients. However, some  are best treated at concentrations outside of this range. Acetaminophen  concentrations >150 ug/mL at 4 hours after ingestion  and >50 ug/mL at 12 hours after ingestion are often  associated with  toxic reactions.  Performed at [facility], [address], [place], [place] [address]   Comprehensive metabolic panel     Status: Abnormal   Collection Time: 04/27/24  3:07 PM  Result Value Ref Range   Sodium 133 (L) 135 - 145 mmol/L   Potassium 4.2 3.5 - 5.1 mmol/L   Chloride 93 (L) 98 - 111 mmol/L   CO2 22 22 - 32 mmol/L   Glucose, Bld 58 (L) 70 - 99 mg/dL    Comment: Glucose reference range applies only to samples taken after fasting for at least 8 hours.   BUN 44 (H) 8 - 23 mg/dL   Creatinine, Ser 3.23 (H) 0.44 - 1.00 mg/dL   Calcium  6.8 (L) 8.9 - 10.3 mg/dL   Total Protein 6.2 (L) 6.5 - 8.1 g/dL   Albumin 1.8 (L) 3.5 - 5.0 g/dL   AST 8,128 (H) 15 - 41 U/L   ALT 518 (H) 0 - 44 U/L   Alkaline Phosphatase 214 (H) 38 - 126 U/L   Total Bilirubin 1.6 (H) 0.0 - 1.2 mg/dL   GFR, Estimated 6 (L) >60 mL/min    Comment: (NOTE) Calculated using the CKD-EPI Creatinine Equation (2021)    Anion gap 18 (H) 5 - 15    Comment: Performed at [facility], [address], [place], [place] [address]  Magnesium     Status: None   Collection Time: 04/27/24  3:07 PM  Result Value Ref Range   Magnesium 2.1 1.7 - 2.4 mg/dL    Comment: Performed at [facility], [address], [place], [place] [address]  Phosphorus     Status: None   Collection Time: 04/27/24  3:07 PM  Result Value Ref Range   Phosphorus 4.2 2.5 - 4.6 mg/dL    Comment: Performed at [facility], [address] [place], [place] [address]  Procalcitonin     Status: None   Collection Time: 04/27/24  3:07 PM  Result Value Ref Range   Procalcitonin 7.47 ng/mL    Comment:        Interpretation: PCT > 2 ng/mL: Systemic infection (sepsis) is likely, unless other causes are known. (NOTE)       Sepsis PCT Algorithm           Lower Respiratory Tract                                      Infection PCT Algorithm    ----------------------------     ----------------------------         PCT <  0.25 ng/mL                PCT < 0.10 ng/mL          Strongly encourage             Strongly discourage   discontinuation of antibiotics    initiation of antibiotics    ----------------------------     -----------------------------       PCT 0.25 - 0.50 ng/mL            PCT 0.10 - 0.25 ng/mL               OR       >80% decrease in PCT            Discourage initiation of  antibiotics      Encourage discontinuation           of antibiotics    ----------------------------     -----------------------------         PCT >= 0.50 ng/mL              PCT 0.26 - 0.50 ng/mL               AND       <80% decrease in PCT              Encourage initiation of                                             antibiotics       Encourage continuation           of antibiotics    ----------------------------     -----------------------------        PCT >= 0.50 ng/mL                  PCT > 0.50 ng/mL               AND         increase in PCT                  Strongly encourage                                      initiation of antibiotics    Strongly encourage escalation           of antibiotics                                     -----------------------------                                           PCT <= 0.25 ng/mL                                                 OR                                        > 80% decrease in PCT                                      Discontinue / Do not initiate                                             antibiotics  Performed at [facility], [address], [place], [place] [address]   I-Stat Lactic Acid, ED     Status: None  Collection Time: 04/27/24  3:44 PM  Result Value Ref Range   Lactic Acid, Venous 1.5 0.5 - 1.9 mmol/L  C Difficile Quick Screen w PCR reflex     Status: None   Collection Time: 04/27/24  5:57 PM   Specimen: Stool  Result Value Ref Range   C Diff antigen NEGATIVE NEGATIVE   C Diff toxin NEGATIVE  NEGATIVE   C Diff interpretation No C. difficile detected.     Comment: Performed at [facility], [address], [place], [place] [address]  Resp panel by RT-PCR (RSV, Flu A&B, Covid) Anterior Nasal Swab     Status: None   Collection Time: 04/27/24  6:06 PM   Specimen: Anterior Nasal Swab  Result Value Ref Range   SARS Coronavirus 2 by RT PCR NEGATIVE NEGATIVE   Influenza A by PCR NEGATIVE NEGATIVE   Influenza B by PCR NEGATIVE NEGATIVE    Comment: (NOTE) The Xpert Xpress SARS-CoV-2/FLU/RSV plus assay is intended as an aid in the diagnosis of influenza from Nasopharyngeal swab specimens and should not be used as a sole basis for treatment. Nasal washings and aspirates are unacceptable for Xpert Xpress SARS-CoV-2/FLU/RSV testing.  Fact Sheet for Patients: BloggerCourse.com  Fact Sheet for Healthcare Providers: SeriousBroker.it  This test is not yet approved or cleared by the [place]  FDA and has been authorized for detection and/or diagnosis of SARS-CoV-2 by FDA under an Emergency Use Authorization (EUA). This EUA will remain in effect (meaning this test can be used) for the duration of the COVID-19 declaration under Section 564(b)(1) of the Act, 21 U.S.C. section 360bbb-3(b)(1), unless the authorization is terminated or revoked.     Resp Syncytial Virus by PCR NEGATIVE NEGATIVE    Comment: (NOTE) Fact Sheet for Patients: BloggerCourse.com  Fact Sheet for Healthcare Providers: SeriousBroker.it  This test is not yet approved or cleared by the [place]  FDA and has been authorized for detection and/or diagnosis of SARS-CoV-2 by FDA under an Emergency Use Authorization (EUA). This EUA will remain in effect (meaning this test can be used) for the duration of the COVID-19 declaration under Section 564(b)(1) of the Act, 21 U.S.C. section 360bbb-3(b)(1), unless the  authorization is terminated or revoked.  Performed at [facility], [address], [place], [place] [address]   Rapid urine drug screen (hospital performed)     Status: None   Collection Time: 04/27/24  6:15 PM  Result Value Ref Range   Opiates NONE DETECTED NONE DETECTED   Cocaine NONE DETECTED NONE DETECTED   Benzodiazepines NONE DETECTED NONE DETECTED   Amphetamines NONE DETECTED NONE DETECTED   Tetrahydrocannabinol NONE DETECTED NONE DETECTED   Barbiturates NONE DETECTED NONE DETECTED    Comment: (NOTE) DRUG SCREEN FOR MEDICAL PURPOSES ONLY.  IF CONFIRMATION IS NEEDED FOR ANY PURPOSE, NOTIFY LAB WITHIN 5 DAYS.  LOWEST DETECTABLE LIMITS FOR URINE DRUG SCREEN Drug Class                     Cutoff (ng/mL) Amphetamine and metabolites    1000 Barbiturate and metabolites    200 Benzodiazepine                 200 Opiates and metabolites        300 Cocaine and metabolites        300 THC  50 Performed at [facility], [address], [place], [place] [address]   CBC with Differential/Platelet     Status: Abnormal   Collection Time: 04/27/24  6:15 PM  Result Value Ref Range   WBC 29.1 (H) 4.0 - 10.5 K/uL   RBC 3.44 (L) 3.87 - 5.11 MIL/uL   Hemoglobin 9.8 (L) 12.0 - 15.0 g/dL   HCT 70.4 (L) 63.9 - 53.9 %   MCV 85.8 80.0 - 100.0 fL   MCH 28.5 26.0 - 34.0 pg   MCHC 33.2 30.0 - 36.0 g/dL   RDW 84.7 88.4 - 84.4 %   Platelets 139 (L) 150 - 400 K/uL    Comment: REPEATED TO VERIFY   nRBC 0.0 0.0 - 0.2 %   Neutrophils Relative % 90 %   Neutro Abs 26.2 (H) 1.7 - 7.7 K/uL   Lymphocytes Relative 4 %   Lymphs Abs 1.2 0.7 - 4.0 K/uL   Monocytes Relative 5 %   Monocytes Absolute 1.3 (H) 0.1 - 1.0 K/uL   Eosinophils Relative 0 %   Eosinophils Absolute 0.0 0.0 - 0.5 K/uL   Basophils Relative 0 %   Basophils Absolute 0.1 0.0 - 0.1 K/uL   Immature Granulocytes 1 %   Abs Immature Granulocytes 0.31 (H) 0.00 - 0.07 K/uL    Comment: Performed at  [facility], [address], [place], [place] [address]  Urinalysis, w/ Reflex to Culture (Infection Suspected) -Urine, Clean Catch     Status: Abnormal   Collection Time: 04/27/24  6:17 PM  Result Value Ref Range   Specimen Source URINE, CLEAN CATCH    Color, Urine AMBER (A) YELLOW    Comment: BIOCHEMICALS MAY BE AFFECTED BY COLOR   APPearance CLOUDY (A) CLEAR   Specific Gravity, Urine 1.022 1.005 - 1.030   pH 5.0 5.0 - 8.0   Glucose, UA 50 (A) NEGATIVE mg/dL   Hgb urine dipstick LARGE (A) NEGATIVE   Bilirubin Urine NEGATIVE NEGATIVE   Ketones, ur NEGATIVE NEGATIVE mg/dL   Protein, ur >=699 (A) NEGATIVE mg/dL   Nitrite NEGATIVE NEGATIVE   Leukocytes,Ua NEGATIVE NEGATIVE   RBC / HPF 11-20 0 - 5 RBC/hpf   WBC, UA 11-20 0 - 5 WBC/hpf    Comment:        Reflex urine culture not performed if WBC <=10, OR if Squamous epithelial cells >5. If Squamous epithelial cells >5 suggest recollection.    Bacteria, UA FEW (A) NONE SEEN   Squamous Epithelial / HPF 0-5 0 - 5 /HPF   Amorphous Crystal PRESENT     Comment: Performed at [facility], [address], [place], [place] [address]  CBG monitoring, ED     Status: Abnormal   Collection Time: 04/27/24  6:49 PM  Result Value Ref Range   Glucose-Capillary 55 (L) 70 - 99 mg/dL    Comment: Glucose reference range applies only to samples taken after fasting for at least 8 hours.   EKG: Not done on admission so we will order one now  Assessment and Plan: No notes have been filed under this hospital service. Service: Hospitalist  Significant Dehydration from N/V/Diarrhea: S/p 3 Liters of IVF Hydration and will continue maintenance IV fluid hydration with D5 normal saline at 100 mL/h.  Strict I's and O's and daily weights and continue with antiemetics.  Has a colitis so we will continue IV Zosyn and vancomycin  for now.  She received IV vancomycin  and IV Zosyn in  the ED.  Procalcitonin level was 7.47 and lactic acid level was initially  elevated at 2.9 is now 1.5  N/V/Diarrhea: Check COVID, Influenza A/B, RSV all negative. GI pathogen Panel pending, C. difficile was negative. CT Scan showed Diffuse low-density wall thickening involving the colon with rectal sparing, consistent with colitis. This could be due to an infectious or inflammatory colitis and also noted on CT scan was poorly distended stomach with diffuse low density wall thickening consistent with gastritis and as well as mild to moderate urinary bladder wall thickening consistent with cystitis.  Notably she also had a distended gallbladder containing sludge and coronary artery calcification and aortic atherosclerosis.  Continue with supportive care and IV fluid hydration and antiemetics -WBC Trend:  Recent Labs  Lab 04/27/24 1224 04/27/24 1815  WBC 32.1* 29.1*   Sepsis likely secondary to colitis, present on admission: Met sepsis criteria on admission with a tachycardia, tachypnea and leukocytosis.  No fevers noted though.  Broad-spectrum antibiotics as above with fluids as above.  AKI On CKD Stage IV -BUN/Cr Trend: Recent Labs  Lab 04/27/24 1224 04/27/24 1507  BUN 44* 44*  CREATININE 7.35* 6.76*  -Strict I's and O's and daily weights and Place Foley catheter for accurate monitoring.  Given IV fluid hydration 3 L and will continue maintenance IV fluid at 100 mL/h as above; check UA, renal ultrasound -Avoid Nephrotoxic Medications including her home blood pressure medications, Contrast Dyes, Hypotension and Dehydration to Ensure Adequate Renal Perfusion and will need to Renally Adjust Meds -Continue to Monitor and Trend Renal Function carefully and repeat CMP in the AM   Abnormal LFTs/Hyperbilirubinemia: Renal Fxn Trend:  Recent Labs  Lab 04/27/24 1224 04/27/24 1507  AST 2,035* 1,871*  ALT 583* 518*  BILITOT 2.0* 1.6*  ALKPHOS 241* 214*  - Obtain complete abdominal ultrasound to include right upper quadrant ultrasound and renal panel.  Acute hepatitis  panel was negative she had PT/INR that was elevated with INR being 1.9 so we will give her 10 mg of vitamin K.  Avoid hepatotoxic medications and continue to monitor  Hypothyroidism: Continue with levothyroxine  100 mcg p.o. daily and check TSH in a.m.  Recent CVA: Hold Statin  HLD: Hold Statin given Abnormal LFTs  Hypoalbuminemia: Patient's Albumin Level is now 2.0 -> 1.8. CTM and Trend and repeat CMP in the AM  Advance Care Planning:   Code Status: Limited: Do not attempt resuscitation (DNR) -DNR-LIMITED -Do Not Intubate/DNI patient is okay with chest compressions well  Consults: None  Family Communication: Discussed with daughter Bascom over the telephone  Severity of Illness: The appropriate patient status for this patient is INPATIENT. Inpatient status is judged to be reasonable and necessary in order to provide the required intensity of service to ensure the patient's safety. The patient's presenting symptoms, physical exam findings, and initial radiographic and laboratory data in the context of their chronic comorbidities is felt to place them at high risk for further clinical deterioration. Furthermore, it is not anticipated that the patient will be medically stable for discharge from the hospital within 2 midnights of admission.   * I certify that at the point of admission it is my clinical judgment that the patient will require inpatient hospital care spanning beyond 2 midnights from the point of admission due to high intensity of service, high risk for further deterioration and high frequency of surveillance required.*  Author: Alejandro Marker, DO [facility] 04/27/2024 7:44 PM  For on call review [email or web address].

## 2024-04-28 ENCOUNTER — Inpatient Hospital Stay (HOSPITAL_COMMUNITY)

## 2024-04-28 DIAGNOSIS — R7401 Elevation of levels of liver transaminase levels: Secondary | ICD-10-CM | POA: Diagnosis not present

## 2024-04-28 DIAGNOSIS — R7989 Other specified abnormal findings of blood chemistry: Secondary | ICD-10-CM

## 2024-04-28 DIAGNOSIS — N179 Acute kidney failure, unspecified: Secondary | ICD-10-CM

## 2024-04-28 DIAGNOSIS — R933 Abnormal findings on diagnostic imaging of other parts of digestive tract: Secondary | ICD-10-CM | POA: Diagnosis not present

## 2024-04-28 LAB — COMPREHENSIVE METABOLIC PANEL WITH GFR
ALT: 520 U/L — ABNORMAL HIGH (ref 0–44)
AST: 1871 U/L — ABNORMAL HIGH (ref 15–41)
Albumin: 1.7 g/dL — ABNORMAL LOW (ref 3.5–5.0)
Alkaline Phosphatase: 232 U/L — ABNORMAL HIGH (ref 38–126)
Anion gap: 17 — ABNORMAL HIGH (ref 5–15)
BUN: 46 mg/dL — ABNORMAL HIGH (ref 8–23)
CO2: 19 mmol/L — ABNORMAL LOW (ref 22–32)
Calcium: 6.3 mg/dL — CL (ref 8.9–10.3)
Chloride: 98 mmol/L (ref 98–111)
Creatinine, Ser: 6.93 mg/dL — ABNORMAL HIGH (ref 0.44–1.00)
GFR, Estimated: 6 mL/min — ABNORMAL LOW (ref >60)
Glucose, Bld: 232 mg/dL — ABNORMAL HIGH (ref 70–99)
Potassium: 4.8 mmol/L (ref 3.5–5.1)
Sodium: 134 mmol/L — ABNORMAL LOW (ref 135–145)
Total Bilirubin: 1.6 mg/dL — ABNORMAL HIGH (ref 0.0–1.2)
Total Protein: 6 g/dL — ABNORMAL LOW (ref 6.5–8.1)

## 2024-04-28 LAB — GLUCOSE, CAPILLARY
Glucose-Capillary: 228 mg/dL — ABNORMAL HIGH (ref 70–99)
Glucose-Capillary: 194 mg/dL — ABNORMAL HIGH (ref 70–99)
Glucose-Capillary: 142 mg/dL — ABNORMAL HIGH (ref 70–99)
Glucose-Capillary: 102 mg/dL — ABNORMAL HIGH (ref 70–99)
Glucose-Capillary: 76 mg/dL (ref 70–99)

## 2024-04-28 LAB — CBC WITH DIFFERENTIAL/PLATELET
Abs Immature Granulocytes: 0 10*3/uL (ref 0.00–0.07)
Basophils Absolute: 0 10*3/uL (ref 0.0–0.1)
Basophils Relative: 0 %
Eosinophils Absolute: 0 10*3/uL (ref 0.0–0.5)
Eosinophils Relative: 0 %
HCT: 30.1 % — ABNORMAL LOW (ref 36.0–46.0)
Hemoglobin: 10.1 g/dL — ABNORMAL LOW (ref 12.0–15.0)
Lymphocytes Relative: 3 %
Lymphs Abs: 0.9 10*3/uL (ref 0.7–4.0)
MCH: 28.5 pg (ref 26.0–34.0)
MCHC: 33.6 g/dL (ref 30.0–36.0)
MCV: 84.8 fL (ref 80.0–100.0)
Monocytes Absolute: 1.3 10*3/uL — ABNORMAL HIGH (ref 0.1–1.0)
Monocytes Relative: 4 %
Neutro Abs: 29.1 10*3/uL — ABNORMAL HIGH (ref 1.7–7.7)
Neutrophils Relative %: 93 %
Platelets: 147 10*3/uL — ABNORMAL LOW (ref 150–400)
RBC: 3.55 MIL/uL — ABNORMAL LOW (ref 3.87–5.11)
RDW: 15.6 % — ABNORMAL HIGH (ref 11.5–15.5)
WBC: 31.3 10*3/uL — ABNORMAL HIGH (ref 4.0–10.5)
nRBC: 0.1 % (ref 0.0–0.2)
nRBC: 1 /100{WBCs} — ABNORMAL HIGH

## 2024-04-28 LAB — URINE CULTURE: Culture: NO GROWTH

## 2024-04-28 LAB — PROTIME-INR
INR: 1.9 — ABNORMAL HIGH (ref 0.8–1.2)
Prothrombin Time: 22.7 s — ABNORMAL HIGH (ref 11.4–15.2)

## 2024-04-28 LAB — TSH: TSH: 35.103 u[IU]/mL — ABNORMAL HIGH (ref 0.350–4.500)

## 2024-04-28 LAB — PROCALCITONIN: Procalcitonin: 7.3 ng/mL

## 2024-04-28 LAB — MAGNESIUM: Magnesium: 2 mg/dL (ref 1.7–2.4)

## 2024-04-28 LAB — T4, FREE: Free T4: 0.47 ng/dL — ABNORMAL LOW (ref 0.61–1.12)

## 2024-04-28 LAB — C-REACTIVE PROTEIN: CRP: 13.1 mg/dL — ABNORMAL HIGH (ref <1.0)

## 2024-04-28 LAB — CK: Total CK: >20000 U/L — ABNORMAL HIGH (ref 38–234)

## 2024-04-28 LAB — PHOSPHORUS: Phosphorus: 5.3 mg/dL — ABNORMAL HIGH (ref 2.5–4.6)

## 2024-04-28 MED ORDER — CALCIUM GLUCONATE-NACL 2-0.675 GM/100ML-% IV SOLN
2.0000 g | Freq: Once | INTRAVENOUS | Status: AC
  Administered 2024-04-28: 2000 mg via INTRAVENOUS
  Filled 2024-04-28: qty 100

## 2024-04-28 MED ORDER — SODIUM CHLORIDE 0.9 % IV BOLUS
500.0000 mL | Freq: Once | INTRAVENOUS | Status: AC
  Administered 2024-04-28: 500 mL via INTRAVENOUS

## 2024-04-28 MED ORDER — LINEZOLID 600 MG PO TABS
600.0000 mg | ORAL_TABLET | Freq: Two times a day (BID) | ORAL | Status: DC
  Administered 2024-04-28 – 2024-04-29 (×3): 600 mg via ORAL
  Filled 2024-04-28 (×3): qty 1

## 2024-04-28 MED ORDER — KCL IN DEXTROSE-NACL 20-5-0.9 MEQ/L-%-% IV SOLN
INTRAVENOUS | Status: DC
  Filled 2024-04-28 (×2): qty 1000

## 2024-04-28 MED ORDER — LEVOTHYROXINE SODIUM 100 MCG/5ML IV SOLN
75.0000 ug | Freq: Every day | INTRAVENOUS | Status: DC
  Administered 2024-04-28 – 2024-04-29 (×2): 75 ug via INTRAVENOUS
  Filled 2024-04-28 (×2): qty 5

## 2024-04-28 MED ORDER — PROSOURCE PLUS PO LIQD
30.0000 mL | Freq: Two times a day (BID) | ORAL | Status: DC
  Administered 2024-04-28 – 2024-04-29 (×3): 30 mL via ORAL
  Filled 2024-04-28 (×3): qty 30

## 2024-04-28 NOTE — Consult Note (Addendum)
 Renal Service Consult Note [facility]  Kayla Hunt Hunt Kayla JONETTA Fret, MD Requesting Physician: Dr. MYRTIS Hunt   Reason for Consult: Renal failure HPI: The Kayla Hunt is a 73 y.o. year-old w/ PMH as below who presented to ED yesterday morning by EMS who were called by the family because the Kayla Hunt was getting weaker and weaker and could not transfer from the bed to the bedside commode.  Also she was complaining of hallucinations, and no urine output for a day.  She had diarrhea for a week as well.  Kayla Hunt was just here 3 weeks ago for stroke and was discharged.  She has been on antibiotics recently for oral abscess.  In ED blood pressure was 125/85, HR 112, RR 16-22, temp 98.3, 100% O2 sat on room air.  Sodium 132, K+ 4.5, CO2 21, BUN 44, creatinine 7.3, calcium  7.3, albumin 2.0, AST 2035, ALT 583, total bilirubin 2.0, lactic acid 2.9 and repeat 1.5.  WBC 32K, INR 1.9, UA cloudy, amber, protein greater than 300, few bacteria, 11-20 RBCs, 11-20 WBC, 0-5 epi.  CT abdomen showed diffuse areas of inflammation with possible colitis, enteritis, gastritis.  No evidence of perforation.  Kayla Hunt's family said the Kayla Hunt had been nauseous and was vomiting for a few days.  Kayla Hunt was admitted for dehydration, nausea vomiting diarrhea and AKI on CKD stage IV.  Foley catheter was placed and Kayla Hunt received 3 L of fluid hydration plus additional IV fluid at 100 mL/h.  Repeat labs in the evening yesterday showed creatinine down to 6.7.  This morning creatinine was 6.9, BUN 46 and CPK was greater than 20,000.  No urine output has been recorded.  We are asked to see for renal failure   Pt seen in hospital room.  Kayla Hunt is alert and oriented x 3.  Kayla Hunt is pleasant.  She denies any shortness of breath or significant swelling.  She has no questions.   ROS - denies CP, no joint pain, no HA, no blurry vision, no rash   Past Medical History  Past Medical History:  Diagnosis Date   Anemia     Anxiety    Arthritis    legs ([facility])   CHF (congestive heart failure) (HCC)    Chronic back pain    Chronic edema    bilat LE's   CKD (chronic kidney disease), stage IV (HCC)    Kayla Hunt   Depression    Gout    Hyperlipidemia    Hypertension    Hypothyroidism    Seizures (HCC) 05/05/2011   Kayla Hunt   Type II diabetes mellitus (HCC)    Past Surgical History  Past Surgical History:  Procedure Laterality Date   NO PAST SURGERIES     TRANSESOPHAGEAL ECHOCARDIOGRAM (CATH LAB) N/A 03/18/2024   Procedure: TRANSESOPHAGEAL ECHOCARDIOGRAM;  Surgeon: Kayla Headland, MD;  Location: [facility] INVASIVE CV LAB;  Service: Cardiovascular;  Laterality: N/A;   Family History  Family History  Family history unknown: Yes   Social History  reports that she has never smoked. Her smokeless tobacco use includes snuff. She reports that she does not drink alcohol and does not use drugs. Allergies No Known Allergies Home medications Prior to Admission medications   Medication Sig Start Date End Date Taking? Authorizing Provider  amoxicillin (AMOXIL) 500 MG capsule Take 500 mg by mouth every 8 (eight) hours. 04/18/24  Yes [provider]  aspirin  81 MG chewable tablet Chew 81 mg by mouth in the morning.  Yes [provider]  atorvastatin  (LIPITOR ) 80 MG tablet Take 1 tablet (80 mg total) by mouth daily. 03/21/24  Yes Akula, Vijaya, MD  cetirizine (ZYRTEC) 10 MG chewable tablet Chew 10 mg by mouth daily as needed for allergies.   Yes [provider]  levothyroxine  (SYNTHROID ) 100 MCG tablet Take 100 mcg by mouth daily before breakfast.   Yes [provider]  allopurinol  (ZYLOPRIM ) 100 MG tablet Take 100 mg by mouth daily. Kayla Hunt    [provider]  feeding supplement (ENSURE ENLIVE / ENSURE PLUS) LIQD Take 237 mLs by mouth 2 (two) times daily between meals. Kayla Hunt 03/20/24    Akula, Vijaya, MD  hydrALAZINE  (APRESOLINE ) 50 MG tablet Take 1 tablet (50 mg total) by mouth 3 (three) times daily. Kayla Hunt 03/20/24   Akula, Vijaya, MD  losartan -hydrochlorothiazide  (HYZAAR) 50-12.5 MG tablet Take 1 tablet by mouth in the morning. Kayla Hunt 03/23/21   [provider]  metoprolol  tartrate (LOPRESSOR ) 50 MG tablet TAKE 1 TABLET(50 MG) BY MOUTH TWICE DAILY Kayla Hunt 09/26/20   [name], NP     Vitals:   04/28/24 0800 04/28/24 1200 04/28/24 1600 04/28/24 1923  BP: 112/88 123/66 (!) 140/81 110/63  Pulse: 99 96 98 (!) 104  Resp: 19 20 20 19   Temp: 98 F (36.7 C) 98.2 F (36.8 C) 97.8 F (36.6 C) 97.8 F (36.6 C)  TempSrc: Oral Oral Oral Oral  SpO2: 96% 95% 96% 95%  Weight:      Height:       Exam Gen alert, no distress, No rash, cyanosis or gangrene Sclera anicteric, throat clear and a bit dry No jvd or bruits, flat neck veins Chest clear bilat to bases, no rales/ wheezing RRR no RG Abd soft ntnd no mass or ascites +bs GU deferred MS no joint effusions or deformity Ext 1+ bilateral ankle edema, no other edema Neuro is alert, Ox 3 , nf, no asterixis     Date   Creat  eGFR (ml/min) 2009-2012  1.24- 1.68 2019   1.68- 2.17 2020- 2021  1.72- 1.81  2022   1.87 05/11/23   1.83   5/14- 03/20/24  1.80- 2.10 25- 30 ml/min 6/28   7.35 04/28/24  6.93  UA prot > 300, 11-20 rbc/ wbc, 0-5 epi Renal US  8.5/ 8.4 cm kidneys w/o hydro UNa, UCr pending    Assessment/ Plan: AKI on CKD 4: b/l creatinine is 1.8- 2.1 w/ eGFR 25-30 ml/min from may 2025.  Creat here was 7.3 on admission in the setting of recent n/vd, fatigue and possible sepsis. The following day CPK was checked and was > 20,000. Renal US  showed no obstruction. Pt does not appear grossly uremic but some of her fatigue is probably related. Recommend supportive care w/ carefully giving IVF's to  maintain UOP if we can. No indication for RRT at this time. Will follow.  Hypocalcemia: complication of acute rhabdo, agree w/ replacing per pmd.  Volume: dry mouth, flat neck veins, only edema is around the ankles. Will ^ IVF to 100 cc/hr and bolus 500 cc x 1.  Sepsis: per primary team, getting IV zosyn, and appears the IV vanc was dc'd which is good. No hypotension which is good.     Myer Fret  MD [facility] Hunt, 8:21 PM  Recent Labs  Lab 04/27/24 1507 04/27/24 1815 04/28/24  0815  HGB  --  9.8* 10.1*  ALBUMIN 1.8*  --  1.7*  CALCIUM  6.8*  --  6.3*  PHOS 4.2  --  5.3*  CREATININE 6.76*  --  6.93*  K 4.2  --  4.8   Inpatient medications:  (feeding supplement) PROSource Plus  30 mL Oral BID BM   Chlorhexidine Gluconate Cloth  6 each Topical Daily   heparin   5,000 Units Subcutaneous Q8H   insulin  aspart  0-5 Units Subcutaneous QHS   insulin  aspart  0-9 Units Subcutaneous TID WC   levothyroxine   75 mcg Intravenous Daily   linezolid  600 mg Oral Q12H    dextrose  5 % and 0.9 % NaCl with KCl 20 mEq/L 75 mL/hr at 04/28/24 2004   piperacillin-tazobactam (ZOSYN)  IV 2.25 g (04/28/24 1737)   sodium chloride      acetaminophen  **OR** acetaminophen , fentaNYL (SUBLIMAZE) injection, hydrALAZINE , ondansetron  **OR** ondansetron  (ZOFRAN ) IV

## 2024-04-28 NOTE — Plan of Care (Signed)

## 2024-04-28 NOTE — Evaluation (Signed)
 Occupational Therapy Evaluation Patient Details Name: Kayla Hunt MRN: [id number] DOB: Mar 09, 1951 Today's Date: 04/28/2024   History of Present Illness   Pt is 73 yo presenting to [facility] ED on 04/27/24 with extreme weakness and inability to perform ADLs/transfer. Pt being worked up for AKI. Pt recently admitted 03/14/24 for with R weakness from metabolic encephalopathy. PMH: HTN, HLD, Hypothyroidism, seizures, DM II, chronic diastolic HF, CKD IV.     Clinical Impressions Pt admitted for above, PTA was ind for ADLs and transferring to [facility] but has had a steady decline recently and needing more physical assist from daughter (whom cares for her). Pt currently presenting as generally weak and decreased activity tolerance. She needs max A to stand but was not able to achieve full stand to transfer, she also needs total A to CGA for ADLs. Discussed with pt daughter that she would strongly like mother to return home and has DME needed to provide care, just needs pt to get stronger for DC. OT to continue following pt acutely to progress pt as able. Patient would benefit from post acute Home OT services to help maximize functional independence in natural environment      If plan is discharge home, recommend the following:   A lot of help with walking and/or transfers;A lot of help with bathing/dressing/bathroom;Assistance with cooking/housework     Functional Status Assessment   Patient has had a recent decline in their functional status and demonstrates the ability to make significant improvements in function in a reasonable and predictable amount of time.     Equipment Recommendations   None recommended by OT (family has rec DME)     Recommendations for Other Services         Precautions/Restrictions         Mobility Bed Mobility Overal bed mobility: Needs Assistance Bed Mobility: Rolling, Sidelying to Sit, Sit to Supine Rolling: Min assist, Used rails Sidelying to sit: Mod  assist, Used rails   Sit to supine: Min assist, Used rails   General bed mobility comments: mod A to reposition BLEs and raise trunk, cues for pt to sequence mobility to EOB.    Transfers Overall transfer level: Needs assistance Equipment used: Rolling walker (2 wheels) Transfers: Sit to/from Stand Sit to Stand: Max assist           General transfer comment: STS max A, unable to achieve full upright stand but also lack of full effort on pt end      Balance Overall balance assessment: Needs assistance Sitting-balance support: No upper extremity supported, Feet supported Sitting balance-Leahy Scale: Fair Sitting balance - Comments: CGA sitting EOB   Standing balance support: Bilateral upper extremity supported, Reliant on assistive device for balance Standing balance-Leahy Scale: Poor Standing balance comment: RW                           ADL either performed or assessed with clinical judgement   ADL Overall ADL's : Needs assistance/impaired Eating/Feeding: Bed level;Set up   Grooming: Sitting;Wash/dry face;Contact guard assist   Upper Body Bathing: Sitting;Contact guard assist   Lower Body Bathing: Sitting/lateral leans;Minimal assistance   Upper Body Dressing : Bed level;Minimal assistance Upper Body Dressing Details (indicate cue type and reason): don hospital gown Lower Body Dressing: Bed level;Total assistance     Toilet Transfer Details (indicate cue type and reason): unable to transfer at this time. Toileting- Clothing Manipulation and Hygiene: Bed level;Total assistance  Vision         Perception         Praxis         Pertinent Vitals/Pain       Extremity/Trunk Assessment Upper Extremity Assessment Upper Extremity Assessment: Generalized weakness (ROM WFL, not holding up against resistance but lack of effort on pt end. Attmepted x2)   Lower Extremity Assessment Lower Extremity Assessment: Generalized  weakness;Defer to PT evaluation       Communication Communication Communication: No apparent difficulties   Cognition Arousal: Lethargic Behavior During Therapy: Flat affect       Awareness: Online awareness impaired, Intellectual awareness impaired       OT - Cognition Comments: pt pretty fatigued, following ~50% of commands. A&Ox4. Pt likely to be more engaged if more alert                 Following commands: Impaired Following commands impaired: Follows one step commands with increased time, Follows one step commands inconsistently     Cueing  General Comments   Cueing Techniques: Verbal cues  VSS RA   Exercises     Shoulder Instructions      Home Living                                          Prior Functioning/Environment                      OT Problem List: Impaired balance (sitting and/or standing);Decreased activity tolerance;Decreased strength   OT Treatment/Interventions: Self-care/ADL training;Patient/family education;Therapeutic exercise;Balance training;Therapeutic activities;DME and/or AE instruction      OT Goals(Current goals can be found in the care plan section)   Acute Rehab OT Goals Patient Stated Goal: To get strong enough to go back home. OT Goal Formulation: With patient/family Time For Goal Achievement: 05/12/24 Potential to Achieve Goals: Good ADL Goals Pt Will Perform Lower Body Dressing: sitting/lateral leans;with min assist;sit to/from stand Pt Will Transfer to Toilet: bedside commode;stand pivot transfer;with min assist Pt Will Perform Toileting - Clothing Manipulation and hygiene: sitting/lateral leans;with contact guard assist Additional ADL Goal #1: Pt will complete bed mobility with min A as a precursor to seated ADLs   OT Frequency:  Min 2X/week    Co-evaluation              AM-PAC OT 6 Clicks Daily Activity     Outcome Measure Help from another person eating meals?: A  Little Help from another person taking care of personal grooming?: A Little Help from another person toileting, which includes using toliet, bedpan, or urinal?: Total Help from another person bathing (including washing, rinsing, drying)?: A Little Help from another person to put on and taking off regular upper body clothing?: A Little Help from another person to put on and taking off regular lower body clothing?: Total 6 Click Score: 14   End of Session Equipment Utilized During Treatment: Gait belt;Rolling walker (2 wheels) Nurse Communication: Mobility status;Need for lift equipment (hoyer at this time.)  Activity Tolerance: Patient limited by lethargy Patient left: in bed;with bed alarm set;with call bell/phone within reach  OT Visit Diagnosis: Other abnormalities of gait and mobility (R26.89);Muscle weakness (generalized) (M62.81);Unsteadiness on feet (R26.81)                Time: 8886-8865 OT Time Calculation (min): 21 min Charges:  OT General Charges $  OT Visit: 1 Visit OT Evaluation $OT Eval Moderate Complexity: 1 Mod  04/28/2024  AB, OTR/L  Acute Rehabilitation Services  Office: [phone]   Curtistine JONETTA Das 04/28/2024, 2:31 PM

## 2024-04-28 NOTE — Evaluation (Signed)
 Clinical/Bedside Swallow Evaluation Patient Details  Name: Kayla Hunt MRN: [id number] Date of Birth: [birth year]  Today's Date: 04/28/2024 Time: SLP Start Time (ACUTE ONLY): 9057 SLP Stop Time (ACUTE ONLY): 0951 SLP Time Calculation (min) (ACUTE ONLY): 9 min  Past Medical History:  Past Medical History:  Diagnosis Date   Anemia    Anxiety    Arthritis    legs (04/12/2018)   CHF (congestive heart failure) (HCC)    Chronic back pain    Chronic edema    bilat LE's   CKD (chronic kidney disease), stage IV (HCC)    thelbert 04/12/2018   Depression    Gout    Hyperlipidemia    Hypertension    Hypothyroidism    Seizures (HCC) 05/05/2011   thelbert 05/05/2011   Type II diabetes mellitus (HCC)    Past Surgical History:  Past Surgical History:  Procedure Laterality Date   NO PAST SURGERIES     TRANSESOPHAGEAL ECHOCARDIOGRAM (CATH LAB) N/A 03/18/2024   Procedure: TRANSESOPHAGEAL ECHOCARDIOGRAM;  Surgeon: Francyne Headland, MD;  Location: [facility] INVASIVE CV LAB;  Service: Cardiovascular;  Laterality: N/A;   HPI:  Patient is a 73 y.o. female with PMH: CVA three weeks ago, chronic back pain, CKD stage IV, gout, depression, anxiety, HLD, HTN, chronic CHF, carious and absent dentition. She lives at home with her daughter. She presented to the hospital on 04/27/24 with extreme weakness, inability to perform ADL's, nausea, vomiting, very poor PO intake. diarrhea with some black streaks observed by daughter. In ED, CT abdomen concering for acute cholecystitis. She was started on clear liquids diet.    Assessment / Plan / Recommendation  Clinical Impression  Patient is not currently presenting with clinical s/s of dysphagia as per this bedside swallow evaluation. SLP assessed her swallow via consecutive straw sips of thin liquids (water). Swallow initiation was timely and no overt s/s aspiration was observed. Patient not opening jaw fully when speaking and endorses pain from teeth (molars that per  documentation in chart, some with decay). She told SLP that she wanted all her teeth pulled out because of the discomfort/pain, especially when eating solid foods. SLP recommending to advance diet as tolerated but not to advanced beyond Dys 3 (mechanical soft) solids secondary to patient's dental pain. SLP Visit Diagnosis: Dysphagia, unspecified (R13.10)    Aspiration Risk  No limitations    Diet Recommendation Other (Comment) (advance diet as tolerated, no higher than Dys 3 (mechancial soft) secondary to dental pain)    Liquid Administration via: Straw;Cup Medication Administration: Other (Comment) (as tolerated) Supervision: Patient able to self feed Compensations: Slow rate;Small sips/bites Postural Changes: Seated upright at 90 degrees    Other  Recommendations Oral Care Recommendations: Oral care BID     Assistance Recommended at Discharge    Functional Status Assessment Patient has not had a recent decline in their functional status  Frequency and Duration      N/A      Prognosis   N/A     Swallow Study   General Date of Onset: 04/27/24 HPI: Patient is a 73 y.o. female with PMH: CVA three weeks ago, chronic back pain, CKD stage IV, gout, depression, anxiety, HLD, HTN, chronic CHF, carious and absent dentition. She lives at home with her daughter. She presented to the hospital on 04/27/24 with extreme weakness, inability to perform ADL's, nausea, vomiting, very poor PO intake. diarrhea with some black streaks observed by daughter. In ED, CT abdomen concering for acute cholecystitis. She was  started on clear liquids diet. Type of Study: Bedside Swallow Evaluation Previous Swallow Assessment: none found Diet Prior to this Study: Thin liquids (Level 0);Clear liquid diet Temperature Spikes Noted: No Respiratory Status: Room air History of Recent Intubation: No Behavior/Cognition: Alert;Cooperative;Pleasant mood Oral Cavity Assessment: Within Functional Limits Oral Care  Completed by SLP: No Oral Cavity - Dentition: Poor condition;Missing dentition Vision: Functional for self-feeding Self-Feeding Abilities: Needs assist Patient Positioning: Upright in bed Baseline Vocal Quality: Normal Volitional Swallow: Able to elicit    Oral/Motor/Sensory Function Overall Oral Motor/Sensory Function: Generalized oral weakness Mandible: Impaired   Ice Chips     Thin Liquid Thin Liquid: Within functional limits Presentation: Straw    Nectar Thick     Honey Thick     Puree Puree: Not tested   Solid     Solid: Not tested      Norleen IVAR Blase, MA, CCC-SLP Speech Therapy

## 2024-04-28 NOTE — Progress Notes (Signed)
 PROGRESS NOTE                                                                                                                                                                                                             Patient Demographics:    Kayla Hunt, is a 73 y.o. female, DOB - [birth year], FMW:[id number]  Outpatient Primary MD [facility] the [facility] is [facility]    LOS - 1  Admit date - 04/27/2024    Chief Complaint  Patient presents with   Weakness       Brief Narrative (HPI from H&P)    73 y.o. female with medical history significant of but not limited to recent CVA about 3 weeks ago, chronic back pain, CKD stage IV, gout, depression, anxiety, hyperlipidemia, hypertension, chronic CHF who presents with extreme weakness with inability to do her ADLs and even transfer from the bed to bedside commode.  Patient is a very poor historian so most of the history is gathered from the patient's daughter and the ED physician and for the patient's daughter was hospitalized for a stroke in afterwards never really fully recovered.  States that she was doing well initially but then progressively started getting weaker and stopped really eating.  Patient's daughter states that she has started having some diarrhea as Monday and started having some nausea vomiting on Thursday.  Daughter noticed that she is extremely weak and cannot really even get out of the bed with assistance and also the patient started hallucinating.  Patient's daughter noticed that the patient is not urinating very much and appetite has been extremely poor.  Patient's daughter states that the diarrhea brown watery but they also had some black streaks in it yesterday so this was worked concerned her.    Given this the daughter decided to bring her to the hospital for further evaluation.  Patient denied any chest pain shortness of breath, lightheadedness or dizziness  but was only asking for something to drink.  Daughter states that she has been nauseous and vomiting intermittently last few days.  Upon further evaluation in the ED she had a CT scan which showed concern for colitis.  TRH was asked to admit this patient for further workup and evaluation.    Subjective:    Kayla Hunt today has, No headache, No chest pain, No  abdominal pain - No Nausea, No new weakness tingling or numbness, no shortness of breath, improved diarrhea   Assessment  & Plan :    Sepsis caused by diffuse colitis likely infectious causing nausea vomiting diarrhea and dehydration.  Significant Dehydration from N/V/Diarrhea: He is being aggressively hydrated, agree with empiric antibiotics, C. difficile is preliminary negative, GI pathogen panel pending, symptoms have improved, no abdominal pain or tenderness, no right upper quadrant tenderness, continue present care with IV fluids and IV antibiotics, GI to see.   AKI On CKD Stage IV baseline creatinine around 2 Renal ultrasound nonacute, with hydration mild improvement continue to monitor, continue IV fluids avoid nephrotoxins   Abnormal LFTs/Hyperbilirubinemia: Likely due to hypotension and dehydration, trend and monitor, no right upper quadrant tenderness, no signs of active cholecystitis.  GI to follow.   Hypothyroidism: TSH significantly high, recently it was around 100, for now IV Synthroid  till she is here for better absorption.   Recent CVA: Hold Statin   HLD: Hold Statin given Abnormal LFTs   Hypoalbuminemia: Patient's Albumin Level is now 2.0 -> 1.8. CTM and Trend and repeat CMP in the AM   DM type II.  Initially hypoglycemic due to poor oral intake, now improved, gentle D5W, if required ISS.  Lab Results  Component Value Date   HGBA1C 6.3 (H) 03/14/2024    CBG (last 3)  Recent Labs    04/27/24 1849 04/27/24 2108 04/28/24 0740  GLUCAP 55* 49* 228*        Condition - Extremely Guarded  Family  Communication  :   None present  Code Status : DNR  Consults  : GI  PUD Prophylaxis :     Procedures  :     Right upper quadrant ultrasound.  Mildly distended gallbladder but no other signs of cholecystitis.    CT - 1. Diffuse low-density wall thickening involving the colon with rectal sparing, consistent with colitis. This could be due to an infectious or inflammatory colitis. 2. Poorly distended stomach with diffuse low density wall thickening, consistent with gastritis. 3. Mild-to-moderate diffuse urinary bladder wall thickening, consistent with cystitis. 4. Distended gallbladder containing sludge. 5.  Calcific coronary artery and aortic atherosclerosis. Aortic Atherosclerosis (ICD10-I70.0).      Disposition Plan  :    Status is: Inpatient   DVT Prophylaxis  :    heparin  injection 5,000 Units Start: 04/27/24 2200 SCDs Start: 04/27/24 1917 Place TED hose Start: 04/27/24 1917    Lab Results  Component Value Date   PLT 147 (L) 04/28/2024    Diet :  Diet Order             Diet clear liquid Room service appropriate? Yes; Fluid consistency: Thin  Diet effective now                    Inpatient Medications  Scheduled Meds:  Chlorhexidine Gluconate Cloth  6 each Topical Daily   heparin   5,000 Units Subcutaneous Q8H   insulin  aspart  0-5 Units Subcutaneous QHS   insulin  aspart  0-9 Units Subcutaneous TID WC   levothyroxine   75 mcg Intravenous Daily   sodium chloride  flush  3 mL Intravenous Q12H   vancomycin  variable dose per unstable renal function (pharmacist dosing)   Does not apply See admin instructions   Continuous Infusions:  dextrose  5 % and 0.9 % NaCl with KCl 20 mEq/L 75 mL/hr at 04/28/24 0636   piperacillin-tazobactam (ZOSYN)  IV 2.25 g (04/28/24 0846)  sodium chloride  Stopped (04/27/24 2007)   PRN Meds:.acetaminophen  **OR** acetaminophen , fentaNYL (SUBLIMAZE) injection, hydrALAZINE , ondansetron  **OR** ondansetron  (ZOFRAN ) IV, oxyCODONE     Objective:   Vitals:   04/27/24 1940 04/27/24 2125 04/28/24 0000 04/28/24 0800  BP: 124/68 118/88  112/88  Pulse: 93 100  99  Resp: (!) 22 18  19   Temp:  98 F (36.7 C) 97.9 F (36.6 C) 98 F (36.7 C)  TempSrc:  Oral Oral Oral  SpO2: 96% 98%  96%  Weight:      Height:        Wt Readings from Last 3 Encounters:  04/27/24 66.5 kg  03/15/24 66.5 kg  07/15/19 122.6 kg     Intake/Output Summary (Last 24 hours) at 04/28/2024 1030 Last data filed at 04/28/2024 0400 Gross per 24 hour  Intake 120 ml  Output --  Net 120 ml     Physical Exam  Awake Alert, Ox2, No new F.N deficits, Normal affect [place].AT,PERRAL Supple Neck, No JVD,   Symmetrical Chest wall movement, Good air movement bilaterally, CTAB RRR,No Gallops,Rubs or new Murmurs,  +ve B.Sounds, Abd Soft, No tenderness,   No Cyanosis, Clubbing or edema      Data Review:    Recent Labs  Lab 04/27/24 1224 04/27/24 1815 04/28/24 0815  WBC 32.1* 29.1* 31.3*  HGB 11.0* 9.8* 10.1*  HCT 33.1* 29.5* 30.1*  PLT 171 139* 147*  MCV 84.9 85.8 84.8  MCH 28.2 28.5 28.5  MCHC 33.2 33.2 33.6  RDW 15.2 15.2 15.6*  LYMPHSABS 1.1 1.2 0.9  MONOABS 1.2* 1.3* 1.3*  EOSABS 0.0 0.0 0.0  BASOSABS 0.1 0.1 0.0    Recent Labs  Lab 04/27/24 1224 04/27/24 1237 04/27/24 1507 04/27/24 1544 04/28/24 0815  NA 132*  --  133*  --   --   K 4.5  --  4.2  --   --   CL 91*  --  93*  --   --   CO2 21*  --  22  --   --   ANIONGAP 20*  --  18*  --   --   GLUCOSE 71  --  58*  --   --   BUN 44*  --  44*  --   --   CREATININE 7.35*  --  6.76*  --   --   AST 2,035*  --  1,871*  --   --   ALT 583*  --  518*  --   --   ALKPHOS 241*  --  214*  --   --   BILITOT 2.0*  --  1.6*  --   --   ALBUMIN 2.0*  --  1.8*  --   --   PROCALCITON  --   --  7.47  --   --   LATICACIDVEN  --  2.9*  --  1.5  --   INR  --   --  1.9*  --  1.9*  TSH  --   --   --   --  35.103*  MG 2.3  --  2.1  --   --   PHOS  --   --  4.2  --   --   CALCIUM  7.3*  --  6.8*   --   --       Recent Labs  Lab 04/27/24 1224 04/27/24 1237 04/27/24 1507 04/27/24 1544 04/28/24 0815  PROCALCITON  --   --  7.47  --   --  LATICACIDVEN  --  2.9*  --  1.5  --   INR  --   --  1.9*  --  1.9*  TSH  --   --   --   --  35.103*  MG 2.3  --  2.1  --   --   CALCIUM  7.3*  --  6.8*  --   --     --------------------------------------------------------------------------------------------------------------- Lab Results  Component Value Date   CHOL 524 (H) 03/14/2024   HDL 64 03/14/2024   LDLCALC 438 (H) 03/14/2024   TRIG 112 03/14/2024   CHOLHDL 8.2 03/14/2024    Lab Results  Component Value Date   HGBA1C 6.3 (H) 03/14/2024   Recent Labs    04/28/24 0815  TSH 35.103*   No results for input(s): VITAMINB12, FOLATE, FERRITIN, TIBC, IRON, RETICCTPCT in the last 72 hours. ------------------------------------------------------------------------------------------------------------------ Cardiac Enzymes No results for input(s): CKMB, TROPONINI, MYOGLOBIN in the last 168 hours.  Invalid input(s): CK  Micro Results Recent Results (from the past 240 hours)  C Difficile Quick Screen w PCR reflex     Status: None   Collection Time: 04/27/24  5:57 PM   Specimen: Stool  Result Value Ref Range Status   C Diff antigen NEGATIVE NEGATIVE Final   C Diff toxin NEGATIVE NEGATIVE Final   C Diff interpretation No C. difficile detected.  Final    Comment: Performed at [facility], [address] [place], [place] [address]  Resp panel by RT-PCR (RSV, Flu A&B, Covid) Anterior Nasal Swab     Status: None   Collection Time: 04/27/24  6:06 PM   Specimen: Anterior Nasal Swab  Result Value Ref Range Status   SARS Coronavirus 2 by RT PCR NEGATIVE NEGATIVE Final   Influenza A by PCR NEGATIVE NEGATIVE Final   Influenza B by PCR NEGATIVE NEGATIVE Final    Comment: (NOTE) The Xpert Xpress SARS-CoV-2/FLU/RSV plus assay is intended as an aid in the diagnosis  of influenza from Nasopharyngeal swab specimens and should not be used as a sole basis for treatment. Nasal washings and aspirates are unacceptable for Xpert Xpress SARS-CoV-2/FLU/RSV testing.  Fact Sheet for Patients: BloggerCourse.com  Fact Sheet for Healthcare Providers: SeriousBroker.it  This test is not yet approved or cleared by the [place]  FDA and has been authorized for detection and/or diagnosis of SARS-CoV-2 by FDA under an Emergency Use Authorization (EUA). This EUA will remain in effect (meaning this test can be used) for the duration of the COVID-19 declaration under Section 564(b)(1) of the Act, 21 U.S.C. section 360bbb-3(b)(1), unless the authorization is terminated or revoked.     Resp Syncytial Virus by PCR NEGATIVE NEGATIVE Final    Comment: (NOTE) Fact Sheet for Patients: BloggerCourse.com  Fact Sheet for Healthcare Providers: SeriousBroker.it  This test is not yet approved or cleared by the [place]  FDA and has been authorized for detection and/or diagnosis of SARS-CoV-2 by FDA under an Emergency Use Authorization (EUA). This EUA will remain in effect (meaning this test can be used) for the duration of the COVID-19 declaration under Section 564(b)(1) of the Act, 21 U.S.C. section 360bbb-3(b)(1), unless the authorization is terminated or revoked.  Performed at [facility], [address], [place], [place] [address]     Radiology Report DG Chest Port 1 View Result Date: 04/28/2024 CLINICAL DATA:  Shortness of breath EXAM: PORTABLE CHEST 1 VIEW COMPARISON:  04/27/2024 FINDINGS: Heart size is normal. Aortic atherosclerotic calcifications. Unchanged asymmetric elevation of the  left hemidiaphragm. Mild increased interstitial prominence. No pleural effusion or airspace consolidation. Visualized osseous structures appear intact. IMPRESSION: Mild  increased interstitial prominence which may reflect mild interstitial edema. Electronically Signed   By: Waddell Calk M.D.   On: 04/28/2024 07:49   US  Abdomen Limited RUQ (LIVER/GB) Result Date: 04/27/2024 CLINICAL DATA:  Elevated liver function tests. EXAM: ULTRASOUND ABDOMEN LIMITED RIGHT UPPER QUADRANT COMPARISON:  None Available. FINDINGS: Gallbladder: A mild-to-moderate amount of echogenic sludge is seen throughout the gallbladder lumen. No gallstones are visualized. The gallbladder wall measures 6.9 mm in thickness. Pericholecystic fluid is seen. No sonographic Murphy sign noted by sonographer. Common bile duct: Diameter: 4.6 mm Liver: No focal lesion identified. Within normal limits in parenchymal echogenicity. Portal vein is patent on color Doppler imaging with normal direction of blood flow towards the liver. Other: None. IMPRESSION: Gallbladder sludge with gallbladder wall thickening and pericholecystic fluid, without additional findings to suggest the presence of acute cholecystitis. Electronically Signed   By: Suzen Dials M.D.   On: 04/27/2024 21:46   US  RENAL Result Date: 04/27/2024 CLINICAL DATA:  AKI EXAM: RENAL / URINARY TRACT ULTRASOUND COMPLETE COMPARISON:  CT abdomen pelvis 04/27/2024 FINDINGS: Right Kidney: Renal measurements: 8.5 x 4.6 x 5.3 cm = volume: 109 mL. Increased echogenicity. 1.6 cm cyst does not require follow-up. No hydronephrosis. Left Kidney: Renal measurements: 8.4 x 4.3 x 4.7 cm = volume: 88 mL. Increased echogenicity. 1.1 cm cyst does not require follow-up. No hydronephrosis. Bladder: Foley catheter in a nondistended bladder. Other: Exam is compromised by bowel gas. IMPRESSION: 1. Increased echogenicity of the kidneys as can be seen in medical renal disease. No hydronephrosis. Electronically Signed   By: Norman Gatlin M.D.   On: 04/27/2024 21:32   DG CHEST PORT 1 VIEW Result Date: 04/27/2024 CLINICAL DATA:  Leukocytosis. EXAM: PORTABLE CHEST 1 VIEW COMPARISON:   03/13/2024 FINDINGS: Lung volumes are low. Heart is upper normal in size but stable. Aortic atherosclerosis and tortuosity, unchanged. No confluent consolidation. No pleural fluid or pneumothorax. Chronic right shoulder arthropathy. IMPRESSION: Low lung volumes without acute chest finding. Electronically Signed   By: Andrea Gasman M.D.   On: 04/27/2024 21:14   CT ABDOMEN PELVIS WO CONTRAST Result Date: 04/27/2024 CLINICAL DATA:  Acute, non localized abdominal pain. Diarrhea for the past week. No urine output since yesterday. EXAM: CT ABDOMEN AND PELVIS WITHOUT CONTRAST TECHNIQUE: Multidetector CT imaging of the abdomen and pelvis was performed following the standard protocol without IV contrast. RADIATION DOSE REDUCTION: This exam was performed according to the departmental dose-optimization program which includes automated exposure control, adjustment of the mA and/or kV according to patient size and/or use of iterative reconstruction technique. COMPARISON:  None Available. FINDINGS: Lower chest: Normal sized heart. Atheromatous calcifications, including the coronary arteries and aorta. Minimal pericardial effusion. Elevated left hemidiaphragm. Hepatobiliary: Distended gallbladder containing sludge. Unremarkable liver. Pancreas: Moderate pancreatic atrophy. Spleen: Normal in size without focal abnormality. Adrenals/Urinary Tract: Poorly distended urinary bladder with mild-to-moderate diffuse wall thickening. Small urachal remnant. Bilateral simple appearing renal cysts, not needing imaging follow-up. Unremarkable adrenal glands and ureters. Stomach/Bowel: Poorly distended stomach with diffuse low density wall thickening. Diffuse low-density wall thickening involving the colon with rectal sparing. Minimal pericolonic soft tissue stranding. The appendix is not visualized. No secondary signs of appendicitis. Unremarkable small bowel. Vascular/Lymphatic: Atheromatous arterial calcifications without aneurysm. No  enlarged lymph nodes. Reproductive: Uterus and bilateral adnexa are unremarkable. Other: No abdominal wall hernia or abnormality. No abdominopelvic ascites. Musculoskeletal: Mild lumbar and  lower thoracic spine degenerative changes. Small pelvic bone islands. IMPRESSION: 1. Diffuse low-density wall thickening involving the colon with rectal sparing, consistent with colitis. This could be due to an infectious or inflammatory colitis. 2. Poorly distended stomach with diffuse low density wall thickening, consistent with gastritis. 3. Mild-to-moderate diffuse urinary bladder wall thickening, consistent with cystitis. 4. Distended gallbladder containing sludge. 5.  Calcific coronary artery and aortic atherosclerosis. Aortic Atherosclerosis (ICD10-I70.0). Electronically Signed   By: Elspeth Bathe M.D.   On: 04/27/2024 15:45     Signature  -   Lavada Stank M.D on 04/28/2024 at 10:30 AM   -  To page go to [email or web address]

## 2024-04-28 NOTE — TOC Initial Note (Signed)
 Transition of Care [facility]) - Initial/Assessment Note    Patient Details  Name: Kayla Hunt MRN: [id number] Date of Birth: 02-10-1951  [facility] CM/SW Contact:    Marval Gell, RN Phone Number: 04/28/2024, 11:26 AM  Clinical Narrative:                 Per chart review. Patient from home w daughter Bascom who is caregiver.  Recent bout N/V/D dehydration leading to hospitalization.  PT OT pending.  Patient active with [facility] as recently as last month  TOC will continue to follow   Expected Discharge Plan: Home w Home Health Services Barriers to Discharge: Continued Medical Work up   Patient Goals and CMS Choice            Expected Discharge Plan and Services In-house Referral: Clinical Social Work Discharge Planning Services: CM Consult   Living arrangements for the past 2 months: Single Family Home                                      Prior Living Arrangements/Services Living arrangements for the past 2 months: Single Family Home Lives with:: Adult Children                   Activities of Daily Living   ADL Screening (condition at time of admission) Independently performs ADLs?: No Does the patient have a NEW difficulty with bathing/dressing/toileting/self-feeding that is expected to last >3 days?: Yes (Initiates electronic notice to provider for possible OT consult) Does the patient have a NEW difficulty with getting in/out of bed, walking, or climbing stairs that is expected to last >3 days?: Yes (Initiates electronic notice to provider for possible PT consult) Does the patient have a NEW difficulty with communication that is expected to last >3 days?: Yes (Initiates electronic notice to provider for possible SLP consult) Is the patient deaf or have difficulty hearing?: Yes Does the patient have difficulty seeing, even when wearing glasses/contacts?: Yes Does the patient have difficulty concentrating, remembering, or making  decisions?: Yes  Permission Sought/Granted                  Emotional Assessment              Admission diagnosis:  Elevated LFTs [R79.89] AKI (acute kidney injury) (HCC) [N17.9] Acute renal failure, unspecified acute renal failure type (HCC) [N17.9] Patient Active Problem List   Diagnosis Date Noted   AKI (acute kidney injury) (HCC) 04/27/2024   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   TIA (transient ischemic attack) 03/13/2024   Hyperkalemia 04/11/2018   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   Osteoarthritis of both knees 03/11/2015   Dyslipidemia 10/02/2007   MILD MENTAL RETARDATION 10/02/2007   Hearing loss 10/02/2007   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   CHRONIC KIDNEY DISEASE STAGE III (MODERATE) 11/27/2004   Gout, unspecified 01/06/2003   Hypothyroidism 05/28/1998   Essential hypertension, benign 04/15/1998   PCP:  [facility] Pharmacy:   [id number] [place], [place] - [address] AT [address] [place] [address] Phone: [phone] Fax: [phone]  [email or web address] [place], [place] - [address] [place] [address] Phone: [phone] Fax: [phone]  [facility] [address] [place] [address] Phone: [phone] Fax: [phone]  Social Drivers of Health (SDOH) Social History: SDOH Screenings   Food Insecurity: Food Insecurity Present (04/28/2024)  Housing: High Risk (04/28/2024)  Transportation Needs: No Transportation Needs (04/28/2024)  Utilities: Not At Risk (04/28/2024)  Depression (PHQ2-9): Low Risk  (01/27/2021)  Social Connections: Moderately Integrated (04/28/2024)  Tobacco Use: High Risk (04/27/2024)   SDOH Interventions:     Readmission Risk Interventions     No data to display

## 2024-04-28 NOTE — Consult Note (Addendum)
 Consultation Note   Referring Provider:  [facility] PCP: [facility] Primary Gastroenterologist: Unassigned Reason for Consultation: Severe colitis DOA: 04/27/2024         Hospital Day: 2   ASSESSMENT    73 year old female with a history of hypertension, hyperlipidemia, hypothyroidism, gout , seizures, DM2, chronic diastolic heart failure, CKD 4. Admitted with diarrhea and colitis on CT scan   Sepsis.  N/V and diarrhea Gastric and colonic wall thickening on CT scan Could have ? infectious gastroenterocolitis.  WBC 31K. CTAP demonstrating diffuse wall thickening involving the colon with rectal sparing. A poorly distended stomach with diffuse low density wall thickening, consistent with gastritis.  Elevated LFTs (mixed pattern) Possibly shock liver shock.  LFTs improving.  Acute hep panel pending.   Abnormal gallbladder on US  RUQ shows  gallbladder sludge, gallbladder wall thickening and pericholecystic fluid. Not tender in RUQ  Severe hypoalbuminemia.   AKI Some improvement overnight.  Creatinine 6.9 today  ? UTI / cystitis Urine cx pending  Hypothyroidism TSH 35. Takes Synthroid  at home  See PMH for additional history  Principal Problem:   AKI (acute kidney injury) (HCC)   PLAN:   Await GI path panel On Zosyn / Zyvox Will obtain CK and liver doppler to rule thrombosis  Clear liquids Maintenance IVF Trend LFTs. If not improving consider HIDA Defer any need for Synthroid  dose adjustments to admitting team  HPI   **Patient is a very limited historian.   Ms. Fults was admitted through the ED yesterday after presenting with nausea, vomiting and diarrhea for about a week.  Daughter had given the history. Appetite has been poor, urine output low. Patient tells me she has been having diarrhea for a while, but cannot give any more details. She denies N/V or abdominal pain to me but it is clearly  documented in other places. Her white count was 32,000, hemoglobin 11 (baseline 13), BUN 44, creatinine 7.35 (baseline 2.1). CRP 13, lactic acid 2.9,  TSH 35. SARS negative. Acute Hep panel negative. C. difficile negative.  GI path panel pending  RUQ ultrasound shows gallbladder sludge, gallbladder wall thickening and pericholecystic fluid.   Noncontrast CTAP 1.Diffuse low-density wall thickening involving the colon with rectal sparing, consistent with colitis. This could be due to an infectious or inflammatory colitis. 2. Poorly distended stomach with diffuse low density wall thickening, consistent with gastritis. 3. Mild-to-moderate diffuse urinary bladder wall thickening, consistent with cystitis. 4. Distended gallbladder containing sludge. 5.  Calcific coronary artery and aortic atherosclerosis.   ADDENDUM:  Daughter Bascom arrived just after my visit so I was able to get some details. Patient started having diffuse non-bloody diarrhea on Monday  Later in the week she began having N/V as well. She stopped eating and drinking. She subsequently became so weak that she couldn't transfer herself to her bedside chair. She She hasn't complained of abdominal pain but per daughter she doesn't complain much about anything. No known contact with others with similar symptoms but now her grandson is vomiting.     Labs and Imaging:  Recent Labs    04/27/24 1224 04/27/24 1507 04/28/24 0815  PROT 6.5 6.2* 6.0*  ALBUMIN 2.0* 1.8* 1.7*  AST 2,035* 1,871* 1,871*  ALT 583* 518* 520*  ALKPHOS 241* 214* 232*  BILITOT 2.0* 1.6* 1.6*   Recent Labs    04/27/24 1224 04/27/24 1815 04/28/24 0815  WBC 32.1* 29.1* 31.3*  HGB 11.0* 9.8* 10.1*  HCT 33.1* 29.5* 30.1*  MCV 84.9 85.8 84.8  PLT 171 139* 147*   Recent Labs    04/27/24 1224 04/27/24 1507 04/28/24 0815  NA 132* 133* 134*  K 4.5 4.2 4.8  CL 91* 93* 98  CO2 21* 22 19*  GLUCOSE 71 58* 232*  BUN 44* 44* 46*  CREATININE 7.35* 6.76*  6.93*  CALCIUM  7.3* 6.8* 6.3*     DG Chest Port 1 View CLINICAL DATA:  Shortness of breath  EXAM: PORTABLE CHEST 1 VIEW  COMPARISON:  04/27/2024  FINDINGS: Heart size is normal. Aortic atherosclerotic calcifications. Unchanged asymmetric elevation of the left hemidiaphragm. Mild increased interstitial prominence. No pleural effusion or airspace consolidation. Visualized osseous structures appear intact.  IMPRESSION: Mild increased interstitial prominence which may reflect mild interstitial edema.  Electronically Signed   By: Waddell Calk M.D.   On: 04/28/2024 07:49    Pertinent GI Studies    No history of colonoscopy per Daughter None found in Epic  Past Medical History:  Diagnosis Date   Anemia    Anxiety    Arthritis    legs (04/12/2018)   CHF (congestive heart failure) (HCC)    Chronic back pain    Chronic edema    bilat LE's   CKD (chronic kidney disease), stage IV (HCC)    thelbert 04/12/2018   Depression    Gout    Hyperlipidemia    Hypertension    Hypothyroidism    Seizures (HCC) 05/05/2011   thelbert 05/05/2011   Type II diabetes mellitus (HCC)     Past Surgical History:  Procedure Laterality Date   NO PAST SURGERIES     TRANSESOPHAGEAL ECHOCARDIOGRAM (CATH LAB) N/A 03/18/2024   Procedure: TRANSESOPHAGEAL ECHOCARDIOGRAM;  Surgeon: Francyne Headland, MD;  Location: [facility] INVASIVE CV LAB;  Service: Cardiovascular;  Laterality: N/A;    Family History  Family history unknown: Yes    Prior to Admission medications   Medication Sig Start Date End Date Taking? Authorizing Provider  allopurinol  (ZYLOPRIM ) 100 MG tablet TAKE 1 TABLET(100 MG) BY MOUTH DAILY Patient taking differently: Take 100 mg by mouth in the morning. 09/26/20   [name], NP  aspirin  81 MG chewable tablet Chew 81 mg by mouth in the morning.    [provider]  atorvastatin  (LIPITOR ) 80 MG tablet Take 1 tablet (80 mg total) by mouth daily. 03/21/24   Akula, Vijaya, MD   bismuth subsalicylate (PEPTO BISMOL) 262 MG/15ML suspension Take 30 mLs by mouth as needed for indigestion or diarrhea or loose stools.    [provider]  Blood Glucose Monitoring Suppl (TRUE METRIX AIR GLUCOSE METER) DEVI 1 Bag by Does not apply route 3 (three) times daily as needed. 10/09/19   [name], NP  Blood Pressure Monitoring KIT 1 kit by Does not apply route 3 (three) times a week. 12/16/19   [name], NP  cetirizine (ZYRTEC) 10 MG chewable tablet Chew 10 mg by mouth in the morning.    [provider]  feeding supplement (ENSURE ENLIVE / ENSURE PLUS) LIQD Take 237 mLs by mouth 2 (two) times daily between meals. 03/20/24   [name], MD  glucose blood test strip Check blood sugar once daily 09/11/19   [name], NP  hydrALAZINE  (APRESOLINE ) 50 MG tablet Take 1 tablet (50 mg total) by mouth 3 (three) times daily. 03/20/24   Akula, Vijaya, MD  levothyroxine  (SYNTHROID ) 100 MCG tablet Take 100 mcg by mouth daily before breakfast.    [provider]  losartan -hydrochlorothiazide  (HYZAAR) 50-12.5 MG tablet Take 1 tablet by mouth in the morning. 03/23/21   [provider]  metoprolol  tartrate (LOPRESSOR ) 50 MG tablet TAKE 1 TABLET(50 MG) BY MOUTH TWICE DAILY Patient taking differently: Take 50 mg by mouth 2 (two) times daily. 09/26/20   [name], NP  TRUEplus Lancets 26G MISC 1 Bottle by Does not apply route daily. Test blood sugar once daily 09/11/19   [name], NP    Current Facility-Administered Medications  Medication Dose Route Frequency Provider Last Rate Last Admin   (feeding supplement) PROSource Plus liquid 30 mL  30 mL Oral BID BM Singh, Prashant K, MD       acetaminophen  (TYLENOL ) tablet 650 mg  650 mg Oral Q6H PRN Sheikh, Omair Latif, DO       Or   acetaminophen  (TYLENOL ) suppository 650 mg  650 mg Rectal Q6H PRN Sheikh, Omair Latif, DO       calcium  gluconate 2 g/ 100 mL sodium chloride   IVPB  2 g Intravenous Once Singh, Prashant K, MD       Chlorhexidine Gluconate Cloth 2 % PADS 6 each  6 each Topical Daily Sheikh, Omair [place], DO   6 each at 04/28/24 9156   dextrose  5 % and 0.9 % NaCl with KCl 20 mEq/L infusion   Intravenous Continuous Singh, Prashant K, MD 75 mL/hr at 04/28/24 0636 New Bag at 04/28/24 0636   fentaNYL (SUBLIMAZE) injection 12.5 mcg  12.5 mcg Intravenous Q2H PRN Sheikh, Omair Latif, DO       heparin  injection 5,000 Units  5,000 Units Subcutaneous Q8H Sheikh, Omair Latif, DO   5,000 Units at 04/28/24 9480   hydrALAZINE  (APRESOLINE ) injection 10 mg  10 mg Intravenous Q6H PRN Sheikh, Omair Latif, DO       insulin  aspart (novoLOG ) injection 0-5 Units  0-5 Units Subcutaneous QHS Sheikh, Omair Latif, DO       insulin  aspart (novoLOG ) injection 0-9 Units  0-9 Units Subcutaneous TID WC Sheikh, Omair Latif, DO   3 Units at 04/28/24 9157   levothyroxine  (SYNTHROID , LEVOTHROID) injection 75 mcg  75 mcg Intravenous Daily Singh, Prashant K, MD   75 mcg at 04/28/24 1146   linezolid (ZYVOX) tablet 600 mg  600 mg Oral Q12H Singh, Prashant K, MD   600 mg at 04/28/24 1147   ondansetron  (ZOFRAN ) tablet 4 mg  4 mg Oral Q6H PRN Sheikh, Omair Latif, DO       Or   ondansetron  (ZOFRAN ) injection 4 mg  4 mg Intravenous Q6H PRN Sheikh, Omair Latif, DO       piperacillin-tazobactam (ZOSYN) IVPB 2.25 g  2.25 g Intravenous Q8H Sheikh, Omair [place], DO 100 mL/hr at 04/28/24 0846 2.25 g at 04/28/24 0846    Allergies as of 04/27/2024   (No Known Allergies)    Social History   Socioeconomic History   Marital status: Single    Spouse name: Not on file   Number of children: Not on file   Years of education: Not on file   Highest education level: Not on file  Occupational History   Not on file  Tobacco Use   Smoking status: Never   Smokeless tobacco: Current    Types:  Snuff  Vaping Use   Vaping status: Never Used  Substance and Sexual Activity   Alcohol use: No   Drug use: No    Sexual activity: Not on file  Other Topics Concern   Not on file  Social History Narrative   Not on file   Social Drivers of Health   Financial Resource Strain: Not on file  Food Insecurity: Food Insecurity Present (04/28/2024)   Hunger Vital Sign    Worried About Running Out of Food in the Last Year: Sometimes true    Ran Out of Food in the Last Year: Sometimes true  Transportation Needs: No Transportation Needs (04/28/2024)   PRAPARE - Administrator, Civil Service (Medical): No    Lack of Transportation (Non-Medical): No  Physical Activity: Not on file  Stress: Not on file  Social Connections: Moderately Integrated (04/28/2024)   Social Connection and Isolation Panel    Frequency of Communication with Friends and Family: Three times a week    Frequency of Social Gatherings with Friends and Family: Three times a week    Attends Religious Services: More than 4 times per year    Active Member of Clubs or Organizations: No    Attends Banker Meetings: More than 4 times per year    Marital Status: Widowed  Intimate Partner Violence: Not At Risk (04/28/2024)   Humiliation, Afraid, Rape, and Kick questionnaire    Fear of Current or Ex-Partner: No    Emotionally Abused: No    Physically Abused: No    Sexually Abused: No     Code Status   Code Status: Limited: Do not attempt resuscitation (DNR) -DNR-LIMITED -Do Not Intubate/DNI   Review of Systems: All systems reviewed and negative except where noted in HPI.  Physical Exam: Vital signs in last 24 hours: Temp:  [97.6 F (36.4 C)-98 F (36.7 C)] 98 F (36.7 C) (06/29 0800) Pulse Rate:  [89-112] 99 (06/29 0800) Resp:  [7-31] 19 (06/29 0800) BP: (112-134)/(61-90) 112/88 (06/29 0800) SpO2:  [96 %-100 %] 96 % (06/29 0800) Last BM Date : 04/27/24  General:  Pleasant female in NAD Psych:  Cooperative.  Eyes: Pupils equal Ears:  Normal auditory acuity Nose: No deformity, discharge or lesions Neck:   Supple, no masses felt Lungs:  Clear to auscultation.  Heart:  Regular rate, regular rhythm.  Abdomen:  Soft, nondistended, nontender, active bowel sounds, no masses felt Rectal :  Deferred Msk: Symmetrical without gross deformities.  Neurologic:  Alert, oriented, grossly normal neurologically Extremities : No edema Skin:  Intact without significant lesions.    Intake/Output from previous day: 06/28 0701 - 06/29 0700 In: 120 [P.O.:120] Out: -  Intake/Output this shift:  No intake/output data recorded.   Vina Dasen, NP-C   04/28/2024, 12:18 PM

## 2024-04-28 NOTE — Evaluation (Signed)
 Physical Therapy Evaluation Patient Details Name: Kayla Hunt MRN: [id number] DOB: 1951-01-01 Today's Date: 04/28/2024  History of Present Illness  Pt is 73 yo presenting to [facility] ED on 04/27/24 with extreme weakness and inability to perform ADLs/transfer. Pt being worked up for AKI. Pt recently admitted 03/14/24 for with R weakness from metabolic encephalopathy. PMH: HTN, HLD, Hypothyroidism, seizures, DM II, chronic diastolic HF, CKD IV.  Clinical Impression   Pt admitted for above, PTA was ind for ADLs and transferring to [facility] but has had a steady decline recently and needing more physical assist from daughter (whom cares for her). Pt currently presenting as generally weak and decreased activity tolerance. She presents to PT with generalized weakness and decr activity tolerance; Today, she needed up to mod assist to come to sit EOB, and ultimately declined trying standing, citing her need to lay back down; I'm wondering if she had a BP drop while spending time sitting up on the EOB; per OT, pt's daughter would strongly like mother to return home and has DME needed to provide care, and this PT is in agreement; REc HHPT follow up, and potentially a non-emergent ambulance ride home; will benefit from acute pT to maximize independence and safety with mobility, and ADLs, and decr caregiver burden      If plan is discharge home, recommend the following: A lot of help with walking and/or transfers;A lot of help with bathing/dressing/bathroom;Assistance with cooking/housework;Help with stairs or ramp for entrance;Supervision due to cognitive status   Can travel by private vehicle   No    Equipment Recommendations Other (comment);None recommended by PT (Pt is pretty well-equipped)  Recommendations for Other Services       Functional Status Assessment Patient has had a recent decline in their functional status and demonstrates the ability to make significant improvements in function in a reasonable and  predictable amount of time.     Precautions / Restrictions Precautions Precautions: Fall Recall of Precautions/Restrictions: Impaired      Mobility  Bed Mobility Overal bed mobility: Needs Assistance Bed Mobility: Rolling, Sidelying to Sit, Sit to Supine Rolling: Min assist, Used rails Sidelying to sit: Mod assist, Used rails   Sit to supine: Max assist   General bed mobility comments: mod A to reposition BLEs and raise trunk, cues for pt to sequence mobility to EOB; particularly weak after spending time sitting EOB, and needed max assist to lift LEs back into bed    Transfers                   General transfer comment: Asked pt about practicing standing up from EOB, and she tld me she just wanted to lay back down -- I'm curious if her BP was dropping    Ambulation/Gait                  Stairs            Wheelchair Mobility     Tilt Bed    Modified Rankin (Stroke Patients Only)       Balance     Sitting balance-Leahy Scale: Fair Sitting balance - Comments: CGA sitting EOB   Standing balance support: Bilateral upper extremity supported, Reliant on assistive device for balance                                 Pertinent Vitals/Pain Pain Assessment Pain Assessment: No/denies pain Pain Intervention(s): Monitored  during session    Home Living Family/patient expects to be discharged to:: Private residence Living Arrangements: Children Available Help at Discharge: Family;Available 24 hours/day Type of Home: Apartment Home Access: Stairs to enter Entrance Stairs-Rails: None Entrance Stairs-Number of Steps: 5   Home Layout: Two level;Able to live on main level with bedroom/bathroom Home Equipment: Rolling Walker (2 wheels);BSC/3in1;Wheelchair - manual;Shower seat - built in;Shower seat;Hospital bed;Other (comment) (hoyer) Additional Comments: information from OT note. Unable to get in contact with daughter for home set up  information. Pt is a poor historian    Prior Function Prior Level of Function : Needs assist             Mobility Comments: initially able to pivot to [facility] ind but recently been needing more physical assist. ADLs Comments: Pt daughter reports that she has been more ind but now has been having steady decline and needing assist.     Extremity/Trunk Assessment   Upper Extremity Assessment Upper Extremity Assessment: Defer to OT evaluation    Lower Extremity Assessment Lower Extremity Assessment: Generalized weakness (Able to kick R and L LEs against gravity in supine; did not observe active motion of ankles when cued; noting difficulty lifting LEs back into bed)       Communication   Communication Communication: Impaired Factors Affecting Communication: Hearing impaired (had to repeat comments and questions thgoughout session)    Cognition Arousal: Alert Behavior During Therapy: WFL for tasks assessed/performed   PT - Cognitive impairments: Orientation, Awareness, Memory                       PT - Cognition Comments: Pt alert, pleasant, and able to engaged in conversation (in particular, conversation about shows she likes); pt is an inconsistent historian; states she walks to the bathroom, and states she has 2 steps to enter her home and that she manages getting up and down the steps to enter her house with ease; this is in contrast to what OT has documented (pt's daughter was in the room when he worked with pt); ultimately, this PT does not know when the last time pt was able to walk was -- likely not recently, though Following commands: Impaired Following commands impaired: Follows one step commands with increased time     Cueing Cueing Techniques: Verbal cues, Gestural cues     General Comments General comments (skin integrity, edema, etc.): Notable BP drop between intial supine value and after pt laid back down after spending about 10 minutes sitting EOB     Exercises     Assessment/Plan    PT Assessment Patient needs continued PT services  PT Problem List Decreased strength;Decreased range of motion;Decreased activity tolerance;Decreased balance;Decreased mobility;Decreased coordination;Decreased cognition;Decreased knowledge of use of DME;Decreased safety awareness;Decreased knowledge of precautions;Cardiopulmonary status limiting activity       PT Treatment Interventions DME instruction;Gait training;Functional mobility training;Therapeutic activities;Therapeutic exercise;Balance training;Neuromuscular re-education;Cognitive remediation;Patient/family education;Wheelchair mobility training;Manual techniques;Modalities    PT Goals (Current goals can be found in the Care Plan section)  Acute Rehab PT Goals Patient Stated Goal: Did not specifically state, but agreeable to getting up PT Goal Formulation: With patient Time For Goal Achievement: 05/12/24 Potential to Achieve Goals: Fair    Frequency Min 2X/week     Co-evaluation               AM-PAC PT 6 Clicks Mobility  Outcome Measure Help needed turning from your back to your side while in a flat bed  without using bedrails?: A Little Help needed moving from lying on your back to sitting on the side of a flat bed without using bedrails?: A Lot Help needed moving to and from a bed to a chair (including a wheelchair)?: A Lot Help needed standing up from a chair using your arms (e.g., wheelchair or bedside chair)?: Total Help needed to walk in hospital room?: Total Help needed climbing 3-5 steps with a railing? : Total 6 Click Score: 10    End of Session Equipment Utilized During Treatment: Gait belt;Other (comment) (bed pads) Activity Tolerance: Patient tolerated treatment well;Other (comment) (Notably fatigued with incr time sitting EOB, and BP quite low compared to initial supine BP) Patient left: in bed;with call bell/phone within reach;with bed alarm set Nurse  Communication: Mobility status PT Visit Diagnosis: Other abnormalities of gait and mobility (R26.89);Muscle weakness (generalized) (M62.81);Difficulty in walking, not elsewhere classified (R26.2)    Time: 8679-8663 PT Time Calculation (min) (ACUTE ONLY): 16 min   Charges:   PT Evaluation $PT Eval Moderate Complexity: 1 Mod   PT General Charges $$ ACUTE PT VISIT: 1 Visit         Silvano Currier, PT  Acute Rehabilitation Services Office [phone] Secure Chat welcomed   Silvano VEAR Currier 04/28/2024, 5:01 PM

## 2024-04-29 ENCOUNTER — Inpatient Hospital Stay (HOSPITAL_COMMUNITY)

## 2024-04-29 DIAGNOSIS — R7401 Elevation of levels of liver transaminase levels: Secondary | ICD-10-CM

## 2024-04-29 DIAGNOSIS — N185 Chronic kidney disease, stage 5: Secondary | ICD-10-CM

## 2024-04-29 DIAGNOSIS — A0811 Acute gastroenteropathy due to Norwalk agent: Secondary | ICD-10-CM

## 2024-04-29 DIAGNOSIS — N179 Acute kidney failure, unspecified: Secondary | ICD-10-CM | POA: Diagnosis not present

## 2024-04-29 DIAGNOSIS — M6282 Rhabdomyolysis: Secondary | ICD-10-CM

## 2024-04-29 LAB — CBC WITH DIFFERENTIAL/PLATELET
Abs Immature Granulocytes: 0.21 10*3/uL — ABNORMAL HIGH (ref 0.00–0.07)
Basophils Absolute: 0 10*3/uL (ref 0.0–0.1)
Basophils Relative: 0 %
Eosinophils Absolute: 0 10*3/uL (ref 0.0–0.5)
Eosinophils Relative: 0 %
HCT: 27.2 % — ABNORMAL LOW (ref 36.0–46.0)
Hemoglobin: 9 g/dL — ABNORMAL LOW (ref 12.0–15.0)
Immature Granulocytes: 1 %
Lymphocytes Relative: 7 %
Lymphs Abs: 1.6 10*3/uL (ref 0.7–4.0)
MCH: 28.6 pg (ref 26.0–34.0)
MCHC: 33.1 g/dL (ref 30.0–36.0)
MCV: 86.3 fL (ref 80.0–100.0)
Monocytes Absolute: 1 10*3/uL (ref 0.1–1.0)
Monocytes Relative: 4 %
Neutro Abs: 20.7 10*3/uL — ABNORMAL HIGH (ref 1.7–7.7)
Neutrophils Relative %: 88 %
Platelets: 122 10*3/uL — ABNORMAL LOW (ref 150–400)
RBC: 3.15 MIL/uL — ABNORMAL LOW (ref 3.87–5.11)
RDW: 15.7 % — ABNORMAL HIGH (ref 11.5–15.5)
Smear Review: DECREASED
WBC: 23.5 10*3/uL — ABNORMAL HIGH (ref 4.0–10.5)
nRBC: 0.1 % (ref 0.0–0.2)

## 2024-04-29 LAB — CK: Total CK: >20000 U/L — ABNORMAL HIGH (ref 38–234)

## 2024-04-29 LAB — GLUCOSE, CAPILLARY
Glucose-Capillary: 85 mg/dL (ref 70–99)
Glucose-Capillary: 44 mg/dL — CL (ref 70–99)
Glucose-Capillary: 49 mg/dL — ABNORMAL LOW (ref 70–99)
Glucose-Capillary: 125 mg/dL — ABNORMAL HIGH (ref 70–99)
Glucose-Capillary: 91 mg/dL (ref 70–99)

## 2024-04-29 LAB — GASTROINTESTINAL PANEL BY PCR, STOOL (REPLACES STOOL CULTURE)

## 2024-04-29 LAB — COMPREHENSIVE METABOLIC PANEL WITH GFR
ALT: 490 U/L — ABNORMAL HIGH (ref 0–44)
AST: 1771 U/L — ABNORMAL HIGH (ref 15–41)
Albumin: <1.5 g/dL — ABNORMAL LOW (ref 3.5–5.0)
Alkaline Phosphatase: 187 U/L — ABNORMAL HIGH (ref 38–126)
Anion gap: 17 — ABNORMAL HIGH (ref 5–15)
BUN: 49 mg/dL — ABNORMAL HIGH (ref 8–23)
CO2: 15 mmol/L — ABNORMAL LOW (ref 22–32)
Calcium: 6.7 mg/dL — ABNORMAL LOW (ref 8.9–10.3)
Chloride: 100 mmol/L (ref 98–111)
Creatinine, Ser: 7.21 mg/dL — ABNORMAL HIGH (ref 0.44–1.00)
GFR, Estimated: 6 mL/min — ABNORMAL LOW (ref >60)
Glucose, Bld: 79 mg/dL (ref 70–99)
Potassium: 5.2 mmol/L — ABNORMAL HIGH (ref 3.5–5.1)
Sodium: 132 mmol/L — ABNORMAL LOW (ref 135–145)
Total Bilirubin: 1.7 mg/dL — ABNORMAL HIGH (ref 0.0–1.2)
Total Protein: 5.9 g/dL — ABNORMAL LOW (ref 6.5–8.1)

## 2024-04-29 LAB — PHOSPHORUS: Phosphorus: 5.3 mg/dL — ABNORMAL HIGH (ref 2.5–4.6)

## 2024-04-29 LAB — C-REACTIVE PROTEIN: CRP: 9.9 mg/dL — ABNORMAL HIGH (ref <1.0)

## 2024-04-29 LAB — MAGNESIUM: Magnesium: 2 mg/dL (ref 1.7–2.4)

## 2024-04-29 LAB — PROCALCITONIN: Procalcitonin: 5.76 ng/mL

## 2024-04-29 MED ORDER — SODIUM BICARBONATE 650 MG PO TABS
650.0000 mg | ORAL_TABLET | Freq: Three times a day (TID) | ORAL | Status: DC
  Administered 2024-04-29: 650 mg via ORAL
  Filled 2024-04-29: qty 1

## 2024-04-29 MED ORDER — SODIUM ZIRCONIUM CYCLOSILICATE 10 G PO PACK
10.0000 g | PACK | Freq: Two times a day (BID) | ORAL | Status: DC
  Administered 2024-04-29: 10 g via ORAL
  Filled 2024-04-29: qty 1

## 2024-04-29 MED ORDER — IPRATROPIUM-ALBUTEROL 0.5-2.5 (3) MG/3ML IN SOLN
3.0000 mL | Freq: Four times a day (QID) | RESPIRATORY_TRACT | Status: DC
  Administered 2024-04-29: 3 mL via RESPIRATORY_TRACT
  Filled 2024-04-29: qty 3

## 2024-04-29 MED ORDER — GLYCOPYRROLATE 1 MG PO TABS: 1.0000 mg | ORAL_TABLET | ORAL | Status: DC | PRN

## 2024-04-29 MED ORDER — LORAZEPAM 2 MG/ML IJ SOLN: 1.0000 mg | INTRAMUSCULAR | Status: DC | PRN

## 2024-04-29 MED ORDER — HYDROMORPHONE BOLUS VIA INFUSION: 0.2500 mg | INTRAVENOUS | Status: DC | PRN

## 2024-04-29 MED ORDER — POLYVINYL ALCOHOL 1.4 % OP SOLN: 1.0000 [drp] | Freq: Four times a day (QID) | OPHTHALMIC | Status: DC | PRN

## 2024-04-29 MED ORDER — BIOTENE DRY MOUTH MT LIQD: 15.0000 mL | OROMUCOSAL | Status: DC | PRN

## 2024-04-29 MED ORDER — SODIUM CHLORIDE 0.9 % IV SOLN
1.0000 mg/h | INTRAVENOUS | Status: DC
  Filled 2024-04-29: qty 5

## 2024-04-29 MED ORDER — DEXTROSE 50 % IV SOLN
50.0000 mL | INTRAVENOUS | Status: DC | PRN
  Administered 2024-04-29: 50 mL via INTRAVENOUS
  Filled 2024-04-29: qty 50

## 2024-04-29 MED ORDER — LACTATED RINGERS IV SOLN: INTRAVENOUS | Status: DC

## 2024-04-29 MED ORDER — HYDROMORPHONE HCL-NACL 50-0.9 MG/50ML-% IV SOLN: 1.0000 mg/h | INTRAVENOUS | Status: DC

## 2024-04-29 MED ORDER — LORAZEPAM 2 MG/ML PO CONC: 1.0000 mg | ORAL | Status: DC | PRN

## 2024-04-29 MED ORDER — GLYCOPYRROLATE 0.2 MG/ML IJ SOLN: 0.2000 mg | INTRAMUSCULAR | Status: DC | PRN

## 2024-04-29 MED ORDER — LACTATED RINGERS IV BOLUS
500.0000 mL | Freq: Once | INTRAVENOUS | Status: AC
  Administered 2024-04-29: 500 mL via INTRAVENOUS

## 2024-04-29 MED ORDER — TRAMADOL HCL 50 MG PO TABS
50.0000 mg | ORAL_TABLET | Freq: Two times a day (BID) | ORAL | Status: DC | PRN
  Administered 2024-04-29: 50 mg via ORAL
  Filled 2024-04-29: qty 1

## 2024-04-29 MED ORDER — LORAZEPAM 1 MG PO TABS: 1.0000 mg | ORAL_TABLET | ORAL | Status: DC | PRN

## 2024-04-29 MED ORDER — SODIUM CHLORIDE 0.9 % IV SOLN: 12.5000 mg | Freq: Four times a day (QID) | INTRAVENOUS | Status: DC | PRN

## 2024-04-30 LAB — ALDOLASE: Aldolase: 102.5 U/L — ABNORMAL HIGH (ref 3.3–10.3)

## 2024-04-30 NOTE — Progress Notes (Signed)
 Patient ID: SHAQUAVIA WHISONANT, female   DOB: 02-01-1951, 73 y.o.   MRN: [id number]    Progress Note   Subjective   Day # 3 CC; nausea, vomiting, diarrhea, sepsis, severe AKI and severe transaminitis.  Labs today-CBC pending CK greater than 20,000 Sodium 132/potassium 5.2/BUN 49/creatinine 7.21 C. difficile quick screen negative, GI path panel positive for norovirus  Patient has had an episode of hypoglycemia, lethargy improving with treatment.  Nurse says she did have some loose stools last night none today so far no nausea or vomiting Not much appetite and not taking much p.o.     Objective   Vital signs in last 24 hours: Temp:  [97.4 F (36.3 C)-98.1 F (36.7 C)] 97.6 F (36.4 C) 05/26/24 1153) Pulse Rate:  [88-104] 88 May 26, 2024 1153) Resp:  [19-25] 20 05-26-24 1153) BP: (86-140)/(44-81) 104/44 May 26, 2024 1153) SpO2:  [95 %-99 %] 99 % 05-26-24 1153) Last BM Date : 04/28/24 General:    Elderly African-American female in NAD Heart:  Regular rate and rhythm; no murmurs Lungs: Respirations even and unlabored, lungs CTA bilaterally Abdomen:  Soft, nonfocal mid abdominal tenderness, no guarding or rebound normal bowel sounds. Extremities:  Without edema. Neurologic:  Alert and oriented,  grossly normal neurologically. Psych:  Cooperative. Normal mood and affect.  Intake/Output from previous day: 06/29 0701 - 05-26-24 0700 In: -  Out: 5 [Urine:5] Intake/Output this shift: No intake/output data recorded.  Lab Results: Recent Labs    04/27/24 1224 04/27/24 1815 04/28/24 0815  WBC 32.1* 29.1* 31.3*  HGB 11.0* 9.8* 10.1*  HCT 33.1* 29.5* 30.1*  PLT 171 139* 147*   BMET Recent Labs    04/27/24 1507 04/28/24 0815 05/26/24 0906  NA 133* 134* 132*  K 4.2 4.8 5.2*  CL 93* 98 100  CO2 22 19* 15*  GLUCOSE 58* 232* 79  BUN 44* 46* 49*  CREATININE 6.76* 6.93* 7.21*  CALCIUM  6.8* 6.3* 6.7*   LFT Recent Labs    May 26, 2024 0906  PROT 5.9*  ALBUMIN <1.5*  AST 1,771*  ALT 490*   ALKPHOS 187*  BILITOT 1.7*   PT/INR Recent Labs    04/27/24 1507 04/28/24 0815  LABPROT 22.8* 22.7*  INR 1.9* 1.9*    Studies/Results: US  ABD LTD RUG W/LIVER DOPPLER Result Date: 05-26-24 CLINICAL DATA:  Elevated liver function test. EXAM: DUPLEX ULTRASOUND OF LIVER TECHNIQUE: Color and duplex Doppler ultrasound was performed to evaluate the hepatic in-flow and out-flow vessels. COMPARISON:  Ultrasound 04/27/2024 FINDINGS: Liver: Normal parenchymal echogenicity. Normal hepatic contour without nodularity. No focal lesion, mass or intrahepatic biliary ductal dilatation. Gallbladder is distended with some sludge. Slight wall thickening and adjacent fluid/edema. Common duct measures 4 mm. Main Portal Vein size: 0.6 cm Portal Vein Velocities Main Prox:  43.1 cm/sec Main Mid: 50.4 cm/sec Main Dist:  28.0 cm/sec Right: 13.7 cm/sec Left: 23.0 cm/sec Hepatic Vein Velocities Right:  17.9 cm/sec Middle:  37.3 cm/sec Left:  53.6 cm/sec IVC: Present and patent with normal respiratory phasicity. Hepatic Artery Velocity:  189.9 cm/sec Splenic Vein Velocity:  Poorly seen Spleen: Not well seen at this time. Spleen was normal in size on the CT scan of 04/27/2024. Portal Vein Occlusion/Thrombus: No Splenic Vein Occlusion/Thrombus: Not seen Ascites: Trace Varices: No IMPRESSION: Grossly preserved visualized hepatic vasculature. No evidence of portal vein thrombosis by ultrasound. Mild ascites. Gallbladder sludge with mild gallbladder wall thickening, nonspecific. Electronically Signed   By: Ranell Bring M.D.   On: 26-May-2024 11:44   DG Chest [facility]  1 View Result Date: 05/13/24 CLINICAL DATA:  Shortness of breath. EXAM: PORTABLE CHEST 1 VIEW COMPARISON:  04/28/2024 FINDINGS: The lungs are clear without focal pneumonia, edema, pneumothorax or pleural effusion. The cardiopericardial silhouette is within normal limits for size. No acute bony abnormality. Telemetry leads overlie the chest. IMPRESSION: No active disease.  Electronically Signed   By: Camellia Candle M.D.   On: 05/13/2024 08:29   DG Chest Port 1 View Result Date: 04/28/2024 CLINICAL DATA:  Shortness of breath EXAM: PORTABLE CHEST 1 VIEW COMPARISON:  04/27/2024 FINDINGS: Heart size is normal. Aortic atherosclerotic calcifications. Unchanged asymmetric elevation of the left hemidiaphragm. Mild increased interstitial prominence. No pleural effusion or airspace consolidation. Visualized osseous structures appear intact. IMPRESSION: Mild increased interstitial prominence which may reflect mild interstitial edema. Electronically Signed   By: Waddell Calk M.D.   On: 04/28/2024 07:49   US  Abdomen Limited RUQ (LIVER/GB) Result Date: 04/27/2024 CLINICAL DATA:  Elevated liver function tests. EXAM: ULTRASOUND ABDOMEN LIMITED RIGHT UPPER QUADRANT COMPARISON:  None Available. FINDINGS: Gallbladder: A mild-to-moderate amount of echogenic sludge is seen throughout the gallbladder lumen. No gallstones are visualized. The gallbladder wall measures 6.9 mm in thickness. Pericholecystic fluid is seen. No sonographic Murphy sign noted by sonographer. Common bile duct: Diameter: 4.6 mm Liver: No focal lesion identified. Within normal limits in parenchymal echogenicity. Portal vein is patent on color Doppler imaging with normal direction of blood flow towards the liver. Other: None. IMPRESSION: Gallbladder sludge with gallbladder wall thickening and pericholecystic fluid, without additional findings to suggest the presence of acute cholecystitis. Electronically Signed   By: Suzen Dials M.D.   On: 04/27/2024 21:46   US  RENAL Result Date: 04/27/2024 CLINICAL DATA:  AKI EXAM: RENAL / URINARY TRACT ULTRASOUND COMPLETE COMPARISON:  CT abdomen pelvis 04/27/2024 FINDINGS: Right Kidney: Renal measurements: 8.5 x 4.6 x 5.3 cm = volume: 109 mL. Increased echogenicity. 1.6 cm cyst does not require follow-up. No hydronephrosis. Left Kidney: Renal measurements: 8.4 x 4.3 x 4.7 cm = volume:  88 mL. Increased echogenicity. 1.1 cm cyst does not require follow-up. No hydronephrosis. Bladder: Foley catheter in a nondistended bladder. Other: Exam is compromised by bowel gas. IMPRESSION: 1. Increased echogenicity of the kidneys as can be seen in medical renal disease. No hydronephrosis. Electronically Signed   By: Norman Gatlin M.D.   On: 04/27/2024 21:32   DG CHEST PORT 1 VIEW Result Date: 04/27/2024 CLINICAL DATA:  Leukocytosis. EXAM: PORTABLE CHEST 1 VIEW COMPARISON:  03/13/2024 FINDINGS: Lung volumes are low. Heart is upper normal in size but stable. Aortic atherosclerosis and tortuosity, unchanged. No confluent consolidation. No pleural fluid or pneumothorax. Chronic right shoulder arthropathy. IMPRESSION: Low lung volumes without acute chest finding. Electronically Signed   By: Andrea Gasman M.D.   On: 04/27/2024 21:14   CT ABDOMEN PELVIS WO CONTRAST Result Date: 04/27/2024 CLINICAL DATA:  Acute, non localized abdominal pain. Diarrhea for the past week. No urine output since yesterday. EXAM: CT ABDOMEN AND PELVIS WITHOUT CONTRAST TECHNIQUE: Multidetector CT imaging of the abdomen and pelvis was performed following the standard protocol without IV contrast. RADIATION DOSE REDUCTION: This exam was performed according to the departmental dose-optimization program which includes automated exposure control, adjustment of the mA and/or kV according to patient size and/or use of iterative reconstruction technique. COMPARISON:  None Available. FINDINGS: Lower chest: Normal sized heart. Atheromatous calcifications, including the coronary arteries and aorta. Minimal pericardial effusion. Elevated left hemidiaphragm. Hepatobiliary: Distended gallbladder containing sludge. Unremarkable liver. Pancreas: Moderate pancreatic atrophy.  Spleen: Normal in size without focal abnormality. Adrenals/Urinary Tract: Poorly distended urinary bladder with mild-to-moderate diffuse wall thickening. Small urachal remnant.  Bilateral simple appearing renal cysts, not needing imaging follow-up. Unremarkable adrenal glands and ureters. Stomach/Bowel: Poorly distended stomach with diffuse low density wall thickening. Diffuse low-density wall thickening involving the colon with rectal sparing. Minimal pericolonic soft tissue stranding. The appendix is not visualized. No secondary signs of appendicitis. Unremarkable small bowel. Vascular/Lymphatic: Atheromatous arterial calcifications without aneurysm. No enlarged lymph nodes. Reproductive: Uterus and bilateral adnexa are unremarkable. Other: No abdominal wall hernia or abnormality. No abdominopelvic ascites. Musculoskeletal: Mild lumbar and lower thoracic spine degenerative changes. Small pelvic bone islands. IMPRESSION: 1. Diffuse low-density wall thickening involving the colon with rectal sparing, consistent with colitis. This could be due to an infectious or inflammatory colitis. 2. Poorly distended stomach with diffuse low density wall thickening, consistent with gastritis. 3. Mild-to-moderate diffuse urinary bladder wall thickening, consistent with cystitis. 4. Distended gallbladder containing sludge. 5.  Calcific coronary artery and aortic atherosclerosis. Aortic Atherosclerosis (ICD10-I70.0). Electronically Signed   By: Elspeth Bathe M.D.   On: 04/27/2024 15:45       Assessment / Plan:    #10 73 year old African-American female presenting with nausea vomiting diarrhea and weakness, dehydration/acute kidney injury  Stool culture has returned positive for norovirus  #2 acute kidney injury on chronic kidney disease stage V #3 marked transaminitis-CKs markedly elevated consistent with rhabdomyolysis, causing secondary transaminitis  No evidence for thrombosis on ultrasound/Doppler  #4 rhabdomyolysis felt secondary to statin and prolonged immobility, acute illness/acute infection  #5 recent CVA #6 diabetes mellitus #7 hypoalbuminemia  Plan; continue hydration  aggressively for rhabdomyolysis Advance diet as tolerates, will change to full liquids Continue to trend LFTs   Principal Problem:   AKI (acute kidney injury) (HCC) Active Problems:   Elevated LFTs   Abnormal CT scan, gastrointestinal tract     LOS: 2 days   Dandra Velardi PA-C 05/29/24, 12:01 PM

## 2024-04-30 NOTE — Progress Notes (Signed)
 PT Cancellation Note  Patient Details Name: Kayla Hunt MRN: [id number] DOB: Aug 30, 1951   Cancelled Treatment:    Reason Eval/Treat Not Completed: Medical issues which prohibited therapy Pt with abdominal pain and hypotensive with BP 86/63 in supine (worry for further drop in upright sitting);   Will continue to follow,   Silvano Currier, PT  Acute Rehabilitation Services Office [phone] Secure Chat welcomed    Silvano VEAR Currier 04-30-2024, 11:06 AM

## 2024-04-30 NOTE — Inpatient Diabetes Management (Signed)
 Inpatient Diabetes Program Recommendations  AACE/ADA: New Consensus Statement on Inpatient Glycemic Control (2015)  Target Ranges:  Prepandial:   less than 140 mg/dL      Peak postprandial:   less than 180 mg/dL (1-2 hours)      Critically ill patients:  140 - 180 mg/dL   Lab Results  Component Value Date   GLUCAP 44 (LL) 05-10-2024   HGBA1C 6.3 (H) 03/14/2024    Review of Glycemic Control  Latest Reference Range & Units 04/28/24 15:47 04/28/24 19:22 04/28/24 22:15 2024/05/10 07:55 05/10/2024 11:51 05/10/2024 12:19  Glucose-Capillary 70 - 99 mg/dL 857 (H) 897 (H) 76 85 49 (L) 44 (LL)  Diabetes history:  DM Outpatient Diabetes medications:  None Current orders for Inpatient glycemic control:  Novolog  0-9 units tid with meals and HS  Inpatient Diabetes Program Recommendations:    Note CBG's low today. Consider holding Novolog  correction for now or reduce to very sensitive which starts at 151 mg/dL.   Thanks,  Randall Bullocks, RN, BC-ADM Inpatient Diabetes Coordinator Pager [phone]  (8a-5p)

## 2024-04-30 NOTE — Progress Notes (Addendum)
 PROGRESS NOTE                                                                                                                                                                                                             Patient Demographics:    Kayla Hunt, is a 73 y.o. female, DOB - 1951-07-19, FMW:[id number]  Outpatient Primary MD for the patient is [facility]    LOS - 2  Admit date - 04/27/2024    Chief Complaint  Patient presents with   Weakness       Brief Narrative (HPI from H&P)    73 y.o. female with medical history significant of but not limited to recent CVA about 3 weeks ago, chronic back pain, CKD stage IV, gout, depression, anxiety, hyperlipidemia, hypertension, chronic CHF who presents with extreme weakness with inability to do her ADLs and even transfer from the bed to bedside commode.  Patient is a very poor historian so most of the history is gathered from the patient's daughter and the ED physician and for the patient's daughter was hospitalized for a stroke in afterwards never really fully recovered.  States that she was doing well initially but then progressively started getting weaker and stopped really eating.  Patient's daughter states that she has started having some diarrhea as Monday and started having some nausea vomiting on Thursday.  Daughter noticed that she is extremely weak and cannot really even get out of the bed with assistance and also the patient started hallucinating.  Patient's daughter noticed that the patient is not urinating very much and appetite has been extremely poor.  Patient's daughter states that the diarrhea brown watery but they also had some black streaks in it yesterday so this was worked concerned her.    Given this the daughter decided to bring her to the hospital for further evaluation.  Patient denied any chest pain shortness of breath, lightheadedness or dizziness  but was only asking for something to drink.  Daughter states that she has been nauseous and vomiting intermittently last few days.  Upon further evaluation in the ED she had a CT scan which showed concern for colitis.  [facility] was asked to admit this patient for further workup and evaluation.    Subjective:   Patient in bed, appears comfortable, denies any headache, no fever, no  chest pain or pressure, no shortness of breath , no abdominal pain. No focal weakness.   Assessment  & Plan :   Addendum - AKI worsening, now getting encephalopathic, long discussion with daughter x 2, renal team, continue medical rx, if any further decline the Full comfort care.    Sepsis caused by diffuse colitis likely infectious causing nausea vomiting diarrhea and dehydration.  Significant Dehydration from N/V/Diarrhea: He is being aggressively hydrated, agree with empiric antibiotics, C. difficile is preliminary negative, GI pathogen panel pending, symptoms have improved, no abdominal pain or tenderness, no right upper quadrant tenderness, continue present care with IV fluids and IV antibiotics, GI on board.  Clinically improving.   AKI On CKD Stage IV baseline creatinine around 2 Renal ultrasound nonacute, nephrology on board continue aggressive hydration, continue Foley for now for close In-N-Out monitoring.  Abnormal LFTs/Hyperbilirubinemia: Likely due to hypotension and dehydration, trend and monitor, no right upper quadrant tenderness, no signs of active cholecystitis.  GI to follow.   Severe rhabdomyolysis likely due to statin and prolonged immobility.  Holding statin, aggressive hydration, nephrology following.  Monitor.    Hypothyroidism: TSH significantly high, recently it was around 100, for now IV Synthroid  till she is here for better absorption.   Recent CVA: Hold Statin   HLD: Hold Statin given Abnormal LFTs   Hypoalbuminemia: Patient's Albumin Level is now 2.0 -> 1.8. CTM and Trend and repeat CMP in  the AM   DM type II.  Initially hypoglycemic due to poor oral intake, now improved, gentle D5W, if required ISS.  Lab Results  Component Value Date   HGBA1C 6.3 (H) 03/14/2024    CBG (last 3)  Recent Labs    04/28/24 1922 04/28/24 2215 22-May-2024 0755  GLUCAP 102* 76 85        Condition - Extremely Guarded  Family Communication  :   Daughter Kayla Hunt [phone] on call x 6 on 04/28/2024 no response-2 messages left, called 05-22-24 at 9:10 AM message left, updated 22-May-2024  Code Status : DNR  Consults  : GI, nephrology  PUD Prophylaxis :     Procedures  :     Right upper quadrant ultrasound.  Mildly distended gallbladder but no other signs of cholecystitis.    CT - 1. Diffuse low-density wall thickening involving the colon with rectal sparing, consistent with colitis. This could be due to an infectious or inflammatory colitis. 2. Poorly distended stomach with diffuse low density wall thickening, consistent with gastritis. 3. Mild-to-moderate diffuse urinary bladder wall thickening, consistent with cystitis. 4. Distended gallbladder containing sludge. 5.  Calcific coronary artery and aortic atherosclerosis. Aortic Atherosclerosis (ICD10-I70.0).      Disposition Plan  :    Status is: Inpatient   DVT Prophylaxis  :    heparin  injection 5,000 Units Start: 04/27/24 2200 SCDs Start: 04/27/24 1917 Place TED hose Start: 04/27/24 1917    Lab Results  Component Value Date   PLT 147 (L) 04/28/2024    Diet :  Diet Order             Diet clear liquid Room service appropriate? No; Fluid consistency: Thin  Diet effective now                    Inpatient Medications  Scheduled Meds:  (feeding supplement) PROSource Plus  30 mL Oral BID BM   Chlorhexidine Gluconate Cloth  6 each Topical Daily   heparin   5,000 Units Subcutaneous Q8H   insulin  aspart  0-5 Units Subcutaneous QHS   insulin  aspart  0-9 Units Subcutaneous TID WC   levothyroxine   75 mcg Intravenous Daily    Continuous Infusions:  lactated ringers     lactated ringers     piperacillin-tazobactam (ZOSYN)  IV 2.25 g (10/07/2024 0904)   PRN Meds:.acetaminophen  **OR** acetaminophen , fentaNYL (SUBLIMAZE) injection, hydrALAZINE , ondansetron  **OR** ondansetron  (ZOFRAN ) IV    Objective:   Vitals:   04/28/24 1923 May 15, 2024 0035 2024-05-15 0555 2024-05-15 0755  BP: 110/63 121/71 95/64 (!) 89/61  Pulse: (!) 104 (!) 102 98 89  Resp: 19 20 20 20   Temp: 97.8 F (36.6 C) 98.1 F (36.7 C)  (!) 97.4 F (36.3 C)  TempSrc: Oral Oral Oral Oral  SpO2: 95% 96% 96%   Weight:      Height:        Wt Readings from Last 3 Encounters:  04/27/24 66.5 kg  03/15/24 66.5 kg  07/15/19 122.6 kg     Intake/Output Summary (Last 24 hours) at 2024/05/15 0910 Last data filed at 2024/05/15 0142 Gross per 24 hour  Intake --  Output 5 ml  Net -5 ml     Physical Exam  Awake Alert, Ox2, No new F.N deficits, Normal affect [place].AT,PERRAL Supple Neck, No JVD,   Symmetrical Chest wall movement, Good air movement bilaterally, CTAB RRR,No Gallops,Rubs or new Murmurs,  +ve B.Sounds, Abd Soft, No tenderness,   No Cyanosis, Clubbing or edema      Data Review:    Recent Labs  Lab 04/27/24 1224 04/27/24 1815 04/28/24 0815  WBC 32.1* 29.1* 31.3*  HGB 11.0* 9.8* 10.1*  HCT 33.1* 29.5* 30.1*  PLT 171 139* 147*  MCV 84.9 85.8 84.8  MCH 28.2 28.5 28.5  MCHC 33.2 33.2 33.6  RDW 15.2 15.2 15.6*  LYMPHSABS 1.1 1.2 0.9  MONOABS 1.2* 1.3* 1.3*  EOSABS 0.0 0.0 0.0  BASOSABS 0.1 0.1 0.0    Recent Labs  Lab 04/27/24 1224 04/27/24 1237 04/27/24 1507 04/27/24 1544 04/28/24 0815  NA 132*  --  133*  --  134*  K 4.5  --  4.2  --  4.8  CL 91*  --  93*  --  98  CO2 21*  --  22  --  19*  ANIONGAP 20*  --  18*  --  17*  GLUCOSE 71  --  58*  --  232*  BUN 44*  --  44*  --  46*  CREATININE 7.35*  --  6.76*  --  6.93*  AST 2,035*  --  1,871*  --  1,871*  ALT 583*  --  518*  --  520*  ALKPHOS 241*  --  214*  --  232*   BILITOT 2.0*  --  1.6*  --  1.6*  ALBUMIN 2.0*  --  1.8*  --  1.7*  CRP  --   --   --   --  13.1*  PROCALCITON  --   --  7.47  --  7.30  LATICACIDVEN  --  2.9*  --  1.5  --   INR  --   --  1.9*  --  1.9*  TSH  --   --   --   --  35.103*  MG 2.3  --  2.1  --  2.0  PHOS  --   --  4.2  --  5.3*  CALCIUM  7.3*  --  6.8*  --  6.3*      Recent Labs  Lab  04/27/24 1224 04/27/24 1237 04/27/24 1507 04/27/24 1544 04/28/24 0815  CRP  --   --   --   --  13.1*  PROCALCITON  --   --  7.47  --  7.30  LATICACIDVEN  --  2.9*  --  1.5  --   INR  --   --  1.9*  --  1.9*  TSH  --   --   --   --  35.103*  MG 2.3  --  2.1  --  2.0  CALCIUM  7.3*  --  6.8*  --  6.3*    --------------------------------------------------------------------------------------------------------------- Lab Results  Component Value Date   CHOL 524 (H) 03/14/2024   HDL 64 03/14/2024   LDLCALC 438 (H) 03/14/2024   TRIG 112 03/14/2024   CHOLHDL 8.2 03/14/2024    Lab Results  Component Value Date   HGBA1C 6.3 (H) 03/14/2024   Recent Labs    04/28/24 0815 04/28/24 1100  TSH 35.103*  --   FREET4  --  0.47*   No results for input(s): VITAMINB12, FOLATE, FERRITIN, TIBC, IRON, RETICCTPCT in the last 72 hours. ------------------------------------------------------------------------------------------------------------------ Cardiac Enzymes No results for input(s): CKMB, TROPONINI, MYOGLOBIN in the last 168 hours.  Invalid input(s): CK  Micro Results Recent Results (from the past 240 hours)  C Difficile Quick Screen w PCR reflex     Status: None   Collection Time: 04/27/24  5:57 PM   Specimen: Stool  Result Value Ref Range Status   C Diff antigen NEGATIVE NEGATIVE Final   C Diff toxin NEGATIVE NEGATIVE Final   C Diff interpretation No C. difficile detected.  Final    Comment: Performed at [facility], [address], [place], [place] [address]  Resp panel by RT-PCR (RSV, Flu A&B,  Covid) Anterior Nasal Swab     Status: None   Collection Time: 04/27/24  6:06 PM   Specimen: Anterior Nasal Swab  Result Value Ref Range Status   SARS Coronavirus 2 by RT PCR NEGATIVE NEGATIVE Final   Influenza A by PCR NEGATIVE NEGATIVE Final   Influenza B by PCR NEGATIVE NEGATIVE Final    Comment: (NOTE) The Xpert Xpress SARS-CoV-2/FLU/RSV plus assay is intended as an aid in the diagnosis of influenza from Nasopharyngeal swab specimens and should not be used as a sole basis for treatment. Nasal washings and aspirates are unacceptable for Xpert Xpress SARS-CoV-2/FLU/RSV testing.  Fact Sheet for Patients: BloggerCourse.com  Fact Sheet for Healthcare Providers: SeriousBroker.it  This test is not yet approved or cleared by the [place]  FDA and has been authorized for detection and/or diagnosis of SARS-CoV-2 by FDA under an Emergency Use Authorization (EUA). This EUA will remain in effect (meaning this test can be used) for the duration of the COVID-19 declaration under Section 564(b)(1) of the Act, 21 U.S.C. section 360bbb-3(b)(1), unless the authorization is terminated or revoked.     Resp Syncytial Virus by PCR NEGATIVE NEGATIVE Final    Comment: (NOTE) Fact Sheet for Patients: BloggerCourse.com  Fact Sheet for Healthcare Providers: SeriousBroker.it  This test is not yet approved or cleared by the [place]  FDA and has been authorized for detection and/or diagnosis of SARS-CoV-2 by FDA under an Emergency Use Authorization (EUA). This EUA will remain in effect (meaning this test can be used) for the duration of the COVID-19 declaration under Section 564(b)(1) of the Act, 21 U.S.C. section 360bbb-3(b)(1), unless the authorization is terminated or revoked.  Performed at [facility], [address] [place], [place] [address]   Urine Culture     Status: None    Collection Time: 04/27/24  6:17 PM   Specimen: Urine, Random  Result Value Ref Range Status   Specimen Description URINE, RANDOM  Final   Special Requests NONE Reflexed from [id number]  Final   Culture   Final    NO GROWTH Performed at [facility], [address] [place], [place] [address]    Report Status 04/28/2024 FINAL  Final    Radiology Report DG Chest Port 1 View Result Date: 05/17/24 CLINICAL DATA:  Shortness of breath. EXAM: PORTABLE CHEST 1 VIEW COMPARISON:  04/28/2024 FINDINGS: The lungs are clear without focal pneumonia, edema, pneumothorax or pleural effusion. The cardiopericardial silhouette is within normal limits for size. No acute bony abnormality. Telemetry leads overlie the chest. IMPRESSION: No active disease. Electronically Signed   By: Camellia Candle M.D.   On: 2024-05-17 08:29   DG Chest Port 1 View Result Date: 04/28/2024 CLINICAL DATA:  Shortness of breath EXAM: PORTABLE CHEST 1 VIEW COMPARISON:  04/27/2024 FINDINGS: Heart size is normal. Aortic atherosclerotic calcifications. Unchanged asymmetric elevation of the left hemidiaphragm. Mild increased interstitial prominence. No pleural effusion or airspace consolidation. Visualized osseous structures appear intact. IMPRESSION: Mild increased interstitial prominence which may reflect mild interstitial edema. Electronically Signed   By: Waddell Calk M.D.   On: 04/28/2024 07:49   US  Abdomen Limited RUQ (LIVER/GB) Result Date: 04/27/2024 CLINICAL DATA:  Elevated liver function tests. EXAM: ULTRASOUND ABDOMEN LIMITED RIGHT UPPER QUADRANT COMPARISON:  None Available. FINDINGS: Gallbladder: A mild-to-moderate amount of echogenic sludge is seen throughout the gallbladder lumen. No gallstones are visualized. The gallbladder wall measures 6.9 mm in thickness. Pericholecystic fluid is seen. No sonographic Murphy sign noted by sonographer. Common bile duct: Diameter: 4.6 mm Liver: No focal lesion identified. Within normal limits  in parenchymal echogenicity. Portal vein is patent on color Doppler imaging with normal direction of blood flow towards the liver. Other: None. IMPRESSION: Gallbladder sludge with gallbladder wall thickening and pericholecystic fluid, without additional findings to suggest the presence of acute cholecystitis. Electronically Signed   By: Suzen Dials M.D.   On: 04/27/2024 21:46   US  RENAL Result Date: 04/27/2024 CLINICAL DATA:  AKI EXAM: RENAL / URINARY TRACT ULTRASOUND COMPLETE COMPARISON:  CT abdomen pelvis 04/27/2024 FINDINGS: Right Kidney: Renal measurements: 8.5 x 4.6 x 5.3 cm = volume: 109 mL. Increased echogenicity. 1.6 cm cyst does not require follow-up. No hydronephrosis. Left Kidney: Renal measurements: 8.4 x 4.3 x 4.7 cm = volume: 88 mL. Increased echogenicity. 1.1 cm cyst does not require follow-up. No hydronephrosis. Bladder: Foley catheter in a nondistended bladder. Other: Exam is compromised by bowel gas. IMPRESSION: 1. Increased echogenicity of the kidneys as can be seen in medical renal disease. No hydronephrosis. Electronically Signed   By: Norman Gatlin M.D.   On: 04/27/2024 21:32   DG CHEST PORT 1 VIEW Result Date: 04/27/2024 CLINICAL DATA:  Leukocytosis. EXAM: PORTABLE CHEST 1 VIEW COMPARISON:  03/13/2024 FINDINGS: Lung volumes are low. Heart is upper normal in size but stable. Aortic atherosclerosis and tortuosity, unchanged. No confluent consolidation. No pleural fluid or pneumothorax. Chronic right shoulder arthropathy. IMPRESSION: Low lung volumes without acute chest finding. Electronically Signed   By: Andrea Gasman M.D.   On: 04/27/2024 21:14   CT ABDOMEN PELVIS WO CONTRAST Result Date: 04/27/2024 CLINICAL DATA:  Acute, non localized abdominal pain. Diarrhea for the past week. No urine output since yesterday. EXAM:  CT ABDOMEN AND PELVIS WITHOUT CONTRAST TECHNIQUE: Multidetector CT imaging of the abdomen and pelvis was performed following the standard protocol without IV  contrast. RADIATION DOSE REDUCTION: This exam was performed according to the departmental dose-optimization program which includes automated exposure control, adjustment of the mA and/or kV according to patient size and/or use of iterative reconstruction technique. COMPARISON:  None Available. FINDINGS: Lower chest: Normal sized heart. Atheromatous calcifications, including the coronary arteries and aorta. Minimal pericardial effusion. Elevated left hemidiaphragm. Hepatobiliary: Distended gallbladder containing sludge. Unremarkable liver. Pancreas: Moderate pancreatic atrophy. Spleen: Normal in size without focal abnormality. Adrenals/Urinary Tract: Poorly distended urinary bladder with mild-to-moderate diffuse wall thickening. Small urachal remnant. Bilateral simple appearing renal cysts, not needing imaging follow-up. Unremarkable adrenal glands and ureters. Stomach/Bowel: Poorly distended stomach with diffuse low density wall thickening. Diffuse low-density wall thickening involving the colon with rectal sparing. Minimal pericolonic soft tissue stranding. The appendix is not visualized. No secondary signs of appendicitis. Unremarkable small bowel. Vascular/Lymphatic: Atheromatous arterial calcifications without aneurysm. No enlarged lymph nodes. Reproductive: Uterus and bilateral adnexa are unremarkable. Other: No abdominal wall hernia or abnormality. No abdominopelvic ascites. Musculoskeletal: Mild lumbar and lower thoracic spine degenerative changes. Small pelvic bone islands. IMPRESSION: 1. Diffuse low-density wall thickening involving the colon with rectal sparing, consistent with colitis. This could be due to an infectious or inflammatory colitis. 2. Poorly distended stomach with diffuse low density wall thickening, consistent with gastritis. 3. Mild-to-moderate diffuse urinary bladder wall thickening, consistent with cystitis. 4. Distended gallbladder containing sludge. 5.  Calcific coronary artery and  aortic atherosclerosis. Aortic Atherosclerosis (ICD10-I70.0). Electronically Signed   By: Elspeth Bathe M.D.   On: 04/27/2024 15:45     Signature  -   Lavada Stank M.D on 2024/05/01 at 9:10 AM   -  To page go to [email or web address]

## 2024-04-30 NOTE — Death Summary Note (Signed)
 [facility] Death Note                                                                                                                                                                                               Kayla Hunt, is a 73 y.o. female, DOB - 02/05/1951, FMW:[id number]  Admit date - 04/27/2024   Admitting Physician Alejandro Lazarus Marker, DO  Outpatient Primary MD for the patient is [facility]  LOS - 2  Chief Complaint  Patient presents with   Weakness       Notification: [facility] notified of death of 2024-05-24    Date and Time of Death - 2024-05-24 at 6.15 pm  Pronounced by - RN  History of present illness:   Kayla Hunt is a    73 y.o. female with medical history significant of but not limited to recent CVA about 3 weeks ago, chronic back pain, CKD stage IV, gout, depression, anxiety, hyperlipidemia, hypertension, chronic CHF who presents with extreme weakness with inability to do her ADLs and even transfer from the bed to bedside commode.  Patient is a very poor historian so most of the history is gathered from the patient's daughter and the ED physician and for the patient's daughter was hospitalized for a stroke in afterwards never really fully recovered.  States that she was doing well initially but then progressively started getting weaker and stopped really eating.  Patient's daughter states that she has started having some diarrhea as Monday and started having some nausea vomiting on Thursday.  Daughter noticed that she is extremely weak and cannot really even get out of the bed with assistance and also the patient started hallucinating.  Patient's daughter noticed that the patient is not urinating very much and appetite has been extremely poor.  Patient's daughter states that the diarrhea brown watery but they also had some black streaks in it yesterday so this  was worked concerned her.     Given this the daughter decided to bring her to the hospital for further evaluation.  Patient denied any chest pain shortness of breath, lightheadedness or dizziness but was only asking for something to drink.  Daughter states that she has been nauseous and vomiting intermittently last few days.  Upon further evaluation in the ED she had a CT scan which showed concern for colitis, severe AKI.  [facility] was asked to admit this patient for further workup and evaluation. Despite appropriate Rx she declined, she was made full comfort care late on 05/24/24, passed away soon thereafter.    Final Diagnoses:  Cause of  death -  AKI, Colitis  Signature  -    Lavada Stank M.D on May 06, 2024 at 6:27 PM   -  To page go to [email or web address]   Total clinical and documentation time for today Under 30 minutes   Last Note                                                                      PROGRESS NOTE                                                                                                                                                                                                             Patient Demographics:    Kayla Hunt, is a 73 y.o. female, DOB - [birth year], FMW:[id number]  Outpatient Primary MD for the patient is [facility]    LOS - 2  Admit date - 04/27/2024    Chief Complaint  Patient presents with   Weakness       Brief Narrative (HPI from H&P)    73 y.o. female with medical history significant of but not limited to recent CVA about 3 weeks ago, chronic back pain, CKD stage IV, gout, depression, anxiety, hyperlipidemia, hypertension, chronic CHF who presents with extreme weakness with inability to do her ADLs and even transfer from the bed to bedside commode.  Patient is a very poor historian so most of the history is gathered from the patient's daughter and the ED physician and for the patient's daughter was hospitalized for a stroke in afterwards  never really fully recovered.  States that she was doing well initially but then progressively started getting weaker and stopped really eating.  Patient's daughter states that she has started having some diarrhea as Monday and started having some nausea vomiting on Thursday.  Daughter noticed that she is extremely weak and cannot really even get out of the bed with assistance and also the patient started hallucinating.  Patient's daughter noticed that the patient is not urinating very much and appetite has been extremely poor.  Patient's daughter states that the diarrhea brown watery but they also had some black streaks in it yesterday so this was worked concerned her.    Given this the daughter decided to bring her to the hospital for further evaluation.  Patient denied any chest pain shortness of breath, lightheadedness or dizziness but was only asking for something to drink.  Daughter states that she has been nauseous and vomiting intermittently last few days.  Upon further evaluation in the ED she had a CT scan which showed concern for colitis.  [facility] was asked to admit this patient for further workup and evaluation.    Subjective:   Patient in bed, appears comfortable, denies any headache, no fever, no chest pain or pressure, no shortness of breath , no abdominal pain. No focal weakness.   Assessment  & Plan :   Addendum - AKI worsening, now getting encephalopathic, long discussion with daughter x 2, renal team, continue medical rx, if any further decline the Full comfort care.    Sepsis caused by diffuse colitis likely infectious causing nausea vomiting diarrhea and dehydration.  Significant Dehydration from N/V/Diarrhea: He is being aggressively hydrated, agree with empiric antibiotics, C. difficile is preliminary negative, GI pathogen panel pending, symptoms have improved, no abdominal pain or tenderness, no right upper quadrant tenderness, continue present care with IV fluids and IV  antibiotics, GI on board.  Clinically improving.   AKI On CKD Stage IV baseline creatinine around 2 Renal ultrasound nonacute, nephrology on board continue aggressive hydration, continue Foley for now for close In-N-Out monitoring.  Abnormal LFTs/Hyperbilirubinemia: Likely due to hypotension and dehydration, trend and monitor, no right upper quadrant tenderness, no signs of active cholecystitis.  GI to follow.   Severe rhabdomyolysis likely due to statin and prolonged immobility.  Holding statin, aggressive hydration, nephrology following.  Monitor.    Hypothyroidism: TSH significantly high, recently it was around 100, for now IV Synthroid  till she is here for better absorption.   Recent CVA: Hold Statin   HLD: Hold Statin given Abnormal LFTs   Hypoalbuminemia: Patient's Albumin Level is now 2.0 -> 1.8. CTM and Trend and repeat CMP in the AM   DM type II.  Initially hypoglycemic due to poor oral intake, now improved, gentle D5W, if required ISS.  Lab Results  Component Value Date   HGBA1C 6.3 (H) 03/14/2024    CBG (last 3)  Recent Labs    05-01-2024 1219 05/01/2024 1301 May 01, 2024 1558  GLUCAP 44* 125* 91        Condition - Extremely Guarded  Family Communication  :   Daughter Bascom [phone] on call x 6 on 04/28/2024 no response-2 messages left, called 01-May-2024 at 9:10 AM message left, updated May 01, 2024  Code Status : DNR  Consults  : GI, nephrology  PUD Prophylaxis :     Procedures  :     Right upper quadrant ultrasound.  Mildly distended gallbladder but no other signs of cholecystitis.    CT - 1. Diffuse low-density wall thickening involving the colon with rectal sparing, consistent with colitis. This could be due to an infectious or inflammatory colitis. 2. Poorly distended stomach with diffuse low density wall thickening, consistent with gastritis. 3. Mild-to-moderate diffuse urinary bladder wall thickening, consistent with cystitis. 4. Distended gallbladder  containing sludge. 5.  Calcific coronary artery and aortic atherosclerosis. Aortic Atherosclerosis (ICD10-I70.0).      Disposition Plan  :    Status is: Inpatient   DVT Prophylaxis  :    heparin  injection 5,000 Units Start: 04/27/24 2200 SCDs Start: 04/27/24 1917 Place TED hose Start: 04/27/24 1917    Lab Results  Component Value Date   PLT 122 (L) 05/01/24    Diet :  Diet Order  Diet full liquid Room service appropriate? Yes with Assist; Fluid consistency: Thin  Diet effective now                    Inpatient Medications  Scheduled Meds:  heparin   5,000 Units Subcutaneous Q8H   ipratropium-albuterol  3 mL Nebulization Q6H   Continuous Infusions:  chlorproMAZINE (THORAZINE) 12.5 mg in sodium chloride  0.9 % 25 mL IVPB     HYDROmorphone     PRN Meds:.acetaminophen  **OR** acetaminophen , antiseptic oral rinse, artificial tears, chlorproMAZINE (THORAZINE) 12.5 mg in sodium chloride  0.9 % 25 mL IVPB, dextrose , glycopyrrolate **OR** glycopyrrolate **OR** glycopyrrolate, hydrALAZINE , HYDROmorphone, LORazepam  **OR** LORazepam  **OR** LORazepam , ondansetron  **OR** ondansetron  (ZOFRAN ) IV    Objective:   Vitals:   14-May-2024 0755 05-14-2024 0850 05-14-2024 1153 14-May-2024 1602  BP: (!) 89/61 (!) 86/60 (!) 104/44 (!) 95/33  Pulse: 89 89 88 84  Resp: 20 (!) 25 20 20   Temp: (!) 97.4 F (36.3 C)  97.6 F (36.4 C) 97.7 F (36.5 C)  TempSrc: Oral  Oral Oral  SpO2:  95% 99%   Weight:      Height:        Wt Readings from Last 3 Encounters:  04/27/24 66.5 kg  03/15/24 66.5 kg  07/15/19 122.6 kg     Intake/Output Summary (Last 24 hours) at 05-14-2024 1827 Last data filed at 2024/05/14 0142 Gross per 24 hour  Intake --  Output 5 ml  Net -5 ml     Physical Exam  Awake Alert, Ox2, No new F.N deficits, Normal affect [place].AT,PERRAL Supple Neck, No JVD,   Symmetrical Chest wall movement, Good air movement bilaterally, CTAB RRR,No Gallops,Rubs or new Murmurs,   +ve B.Sounds, Abd Soft, No tenderness,   No Cyanosis, Clubbing or edema      Data Review:    Recent Labs  Lab 04/27/24 1224 04/27/24 1815 04/28/24 0815 05/14/2024 0906  WBC 32.1* 29.1* 31.3* 23.5*  HGB 11.0* 9.8* 10.1* 9.0*  HCT 33.1* 29.5* 30.1* 27.2*  PLT 171 139* 147* 122*  MCV 84.9 85.8 84.8 86.3  MCH 28.2 28.5 28.5 28.6  MCHC 33.2 33.2 33.6 33.1  RDW 15.2 15.2 15.6* 15.7*  LYMPHSABS 1.1 1.2 0.9 1.6  MONOABS 1.2* 1.3* 1.3* 1.0  EOSABS 0.0 0.0 0.0 0.0  BASOSABS 0.1 0.1 0.0 0.0    Recent Labs  Lab 04/27/24 1224 04/27/24 1237 04/27/24 1507 04/27/24 1544 04/28/24 0815 2024/05/14 0906  NA 132*  --  133*  --  134* 132*  K 4.5  --  4.2  --  4.8 5.2*  CL 91*  --  93*  --  98 100  CO2 21*  --  22  --  19* 15*  ANIONGAP 20*  --  18*  --  17* 17*  GLUCOSE 71  --  58*  --  232* 79  BUN 44*  --  44*  --  46* 49*  CREATININE 7.35*  --  6.76*  --  6.93* 7.21*  AST 2,035*  --  1,871*  --  1,871* 1,771*  ALT 583*  --  518*  --  520* 490*  ALKPHOS 241*  --  214*  --  232* 187*  BILITOT 2.0*  --  1.6*  --  1.6* 1.7*  ALBUMIN 2.0*  --  1.8*  --  1.7* <1.5*  CRP  --   --   --   --  13.1* 9.9*  PROCALCITON  --   --  7.47  --  7.30 5.76  LATICACIDVEN  --  2.9*  --  1.5  --   --   INR  --   --  1.9*  --  1.9*  --   TSH  --   --   --   --  35.103*  --   MG 2.3  --  2.1  --  2.0 2.0  PHOS  --   --  4.2  --  5.3* 5.3*  CALCIUM  7.3*  --  6.8*  --  6.3* 6.7*      Recent Labs  Lab 04/27/24 1224 04/27/24 1237 04/27/24 1507 04/27/24 1544 04/28/24 0815 04-30-24 0906  CRP  --   --   --   --  13.1* 9.9*  PROCALCITON  --   --  7.47  --  7.30 5.76  LATICACIDVEN  --  2.9*  --  1.5  --   --   INR  --   --  1.9*  --  1.9*  --   TSH  --   --   --   --  35.103*  --   MG 2.3  --  2.1  --  2.0 2.0  CALCIUM  7.3*  --  6.8*  --  6.3* 6.7*    --------------------------------------------------------------------------------------------------------------- Lab Results  Component Value  Date   CHOL 524 (H) 03/14/2024   HDL 64 03/14/2024   LDLCALC 438 (H) 03/14/2024   TRIG 112 03/14/2024   CHOLHDL 8.2 03/14/2024    Lab Results  Component Value Date   HGBA1C 6.3 (H) 03/14/2024   Recent Labs    04/28/24 0815 04/28/24 1100  TSH 35.103*  --   FREET4  --  0.47*   No results for input(s): VITAMINB12, FOLATE, FERRITIN, TIBC, IRON, RETICCTPCT in the last 72 hours. ------------------------------------------------------------------------------------------------------------------ Cardiac Enzymes No results for input(s): CKMB, TROPONINI, MYOGLOBIN in the last 168 hours.  Invalid input(s): CK  Micro Results Recent Results (from the past 240 hours)  C Difficile Quick Screen w PCR reflex     Status: None   Collection Time: 04/27/24  5:57 PM   Specimen: Stool  Result Value Ref Range Status   C Diff antigen NEGATIVE NEGATIVE Final   C Diff toxin NEGATIVE NEGATIVE Final   C Diff interpretation No C. difficile detected.  Final    Comment: Performed at [facility], [address], [place], [place] [address]  Gastrointestinal Panel by PCR , Stool     Status: Abnormal   Collection Time: 04/27/24  5:57 PM   Specimen: Stool  Result Value Ref Range Status   Campylobacter species NOT DETECTED NOT DETECTED Final   Plesimonas shigelloides NOT DETECTED NOT DETECTED Final   Salmonella species NOT DETECTED NOT DETECTED Final   Yersinia enterocolitica NOT DETECTED NOT DETECTED Final   Vibrio species NOT DETECTED NOT DETECTED Final   Vibrio cholerae NOT DETECTED NOT DETECTED Final   Enteroaggregative E coli (EAEC) NOT DETECTED NOT DETECTED Final   Enteropathogenic E coli (EPEC) NOT DETECTED NOT DETECTED Final   Enterotoxigenic E coli (ETEC) NOT DETECTED NOT DETECTED Final   Shiga like toxin producing E coli (STEC) NOT DETECTED NOT DETECTED Final   Shigella/Enteroinvasive E coli (EIEC) NOT DETECTED NOT DETECTED Final   Cryptosporidium NOT DETECTED NOT  DETECTED Final   Cyclospora cayetanensis NOT DETECTED NOT DETECTED Final   Entamoeba histolytica NOT DETECTED NOT DETECTED Final   Giardia lamblia NOT DETECTED NOT DETECTED Final   Adenovirus F40/41 NOT DETECTED NOT DETECTED  Final   Astrovirus NOT DETECTED NOT DETECTED Final   Norovirus GI/GII DETECTED (A) NOT DETECTED Final    Comment: RESULT CALLED TO, READ BACK BY AND VERIFIED WITH: FLORENCE SNOW RN [id number] 16-May-2024 HNM    Rotavirus A NOT DETECTED NOT DETECTED Final   Sapovirus (I, II, IV, and V) NOT DETECTED NOT DETECTED Final    Comment: Performed at [facility], [address], [place], [place] [address]  Resp panel by RT-PCR (RSV, Flu A&B, Covid) Anterior Nasal Swab     Status: None   Collection Time: 04/27/24  6:06 PM   Specimen: Anterior Nasal Swab  Result Value Ref Range Status   SARS Coronavirus 2 by RT PCR NEGATIVE NEGATIVE Final   Influenza A by PCR NEGATIVE NEGATIVE Final   Influenza B by PCR NEGATIVE NEGATIVE Final    Comment: (NOTE) The Xpert Xpress SARS-CoV-2/FLU/RSV plus assay is intended as an aid in the diagnosis of influenza from Nasopharyngeal swab specimens and should not be used as a sole basis for treatment. Nasal washings and aspirates are unacceptable for Xpert Xpress SARS-CoV-2/FLU/RSV testing.  Fact Sheet for Patients: BloggerCourse.com  Fact Sheet for Healthcare Providers: SeriousBroker.it  This test is not yet approved or cleared by the [place]  FDA and has been authorized for detection and/or diagnosis of SARS-CoV-2 by FDA under an Emergency Use Authorization (EUA). This EUA will remain in effect (meaning this test can be used) for the duration of the COVID-19 declaration under Section 564(b)(1) of the Act, 21 U.S.C. section 360bbb-3(b)(1), unless the authorization is terminated or revoked.     Resp Syncytial Virus by PCR NEGATIVE NEGATIVE Final    Comment: (NOTE) Fact Sheet for  Patients: BloggerCourse.com  Fact Sheet for Healthcare Providers: SeriousBroker.it  This test is not yet approved or cleared by the [place]  FDA and has been authorized for detection and/or diagnosis of SARS-CoV-2 by FDA under an Emergency Use Authorization (EUA). This EUA will remain in effect (meaning this test can be used) for the duration of the COVID-19 declaration under Section 564(b)(1) of the Act, 21 U.S.C. section 360bbb-3(b)(1), unless the authorization is terminated or revoked.  Performed at [facility], [address] [place], [place] [address]   Urine Culture     Status: None   Collection Time: 04/27/24  6:17 PM   Specimen: Urine, Random  Result Value Ref Range Status   Specimen Description URINE, RANDOM  Final   Special Requests NONE Reflexed from [id number]  Final   Culture   Final    NO GROWTH Performed at [facility], [address], [place], [place] [address]    Report Status 04/28/2024 FINAL  Final    Radiology Report US  ABD LTD RUG W/LIVER DOPPLER Result Date: 05-16-24 CLINICAL DATA:  Elevated liver function test. EXAM: DUPLEX ULTRASOUND OF LIVER TECHNIQUE: Color and duplex Doppler ultrasound was performed to evaluate the hepatic in-flow and out-flow vessels. COMPARISON:  Ultrasound 04/27/2024 FINDINGS: Liver: Normal parenchymal echogenicity. Normal hepatic contour without nodularity. No focal lesion, mass or intrahepatic biliary ductal dilatation. Gallbladder is distended with some sludge. Slight wall thickening and adjacent fluid/edema. Common duct measures 4 mm. Main Portal Vein size: 0.6 cm Portal Vein Velocities Main Prox:  43.1 cm/sec Main Mid: 50.4 cm/sec Main Dist:  28.0 cm/sec Right: 13.7 cm/sec Left: 23.0 cm/sec Hepatic Vein Velocities Right:  17.9 cm/sec Middle:  37.3 cm/sec Left:  53.6 cm/sec IVC: Present and patent with normal respiratory phasicity. Hepatic Artery Velocity:  189.9  cm/sec Splenic Vein Velocity:  Poorly seen Spleen: Not well seen at this time. Spleen was normal in size on the CT scan of 04/27/2024. Portal Vein Occlusion/Thrombus: No Splenic Vein Occlusion/Thrombus: Not seen Ascites: Trace Varices: No IMPRESSION: Grossly preserved visualized hepatic vasculature. No evidence of portal vein thrombosis by ultrasound. Mild ascites. Gallbladder sludge with mild gallbladder wall thickening, nonspecific. Electronically Signed   By: Ranell Bring M.D.   On: 05-20-24 11:44   DG Chest Port 1 View Result Date: 2024/05/20 CLINICAL DATA:  Shortness of breath. EXAM: PORTABLE CHEST 1 VIEW COMPARISON:  04/28/2024 FINDINGS: The lungs are clear without focal pneumonia, edema, pneumothorax or pleural effusion. The cardiopericardial silhouette is within normal limits for size. No acute bony abnormality. Telemetry leads overlie the chest. IMPRESSION: No active disease. Electronically Signed   By: Camellia Candle M.D.   On: May 20, 2024 08:29   DG Chest Port 1 View Result Date: 04/28/2024 CLINICAL DATA:  Shortness of breath EXAM: PORTABLE CHEST 1 VIEW COMPARISON:  04/27/2024 FINDINGS: Heart size is normal. Aortic atherosclerotic calcifications. Unchanged asymmetric elevation of the left hemidiaphragm. Mild increased interstitial prominence. No pleural effusion or airspace consolidation. Visualized osseous structures appear intact. IMPRESSION: Mild increased interstitial prominence which may reflect mild interstitial edema. Electronically Signed   By: Waddell Calk M.D.   On: 04/28/2024 07:49   US  Abdomen Limited RUQ (LIVER/GB) Result Date: 04/27/2024 CLINICAL DATA:  Elevated liver function tests. EXAM: ULTRASOUND ABDOMEN LIMITED RIGHT UPPER QUADRANT COMPARISON:  None Available. FINDINGS: Gallbladder: A mild-to-moderate amount of echogenic sludge is seen throughout the gallbladder lumen. No gallstones are visualized. The gallbladder wall measures 6.9 mm in thickness. Pericholecystic fluid is  seen. No sonographic Murphy sign noted by sonographer. Common bile duct: Diameter: 4.6 mm Liver: No focal lesion identified. Within normal limits in parenchymal echogenicity. Portal vein is patent on color Doppler imaging with normal direction of blood flow towards the liver. Other: None. IMPRESSION: Gallbladder sludge with gallbladder wall thickening and pericholecystic fluid, without additional findings to suggest the presence of acute cholecystitis. Electronically Signed   By: Suzen Dials M.D.   On: 04/27/2024 21:46   US  RENAL Result Date: 04/27/2024 CLINICAL DATA:  AKI EXAM: RENAL / URINARY TRACT ULTRASOUND COMPLETE COMPARISON:  CT abdomen pelvis 04/27/2024 FINDINGS: Right Kidney: Renal measurements: 8.5 x 4.6 x 5.3 cm = volume: 109 mL. Increased echogenicity. 1.6 cm cyst does not require follow-up. No hydronephrosis. Left Kidney: Renal measurements: 8.4 x 4.3 x 4.7 cm = volume: 88 mL. Increased echogenicity. 1.1 cm cyst does not require follow-up. No hydronephrosis. Bladder: Foley catheter in a nondistended bladder. Other: Exam is compromised by bowel gas. IMPRESSION: 1. Increased echogenicity of the kidneys as can be seen in medical renal disease. No hydronephrosis. Electronically Signed   By: Norman Gatlin M.D.   On: 04/27/2024 21:32   DG CHEST PORT 1 VIEW Result Date: 04/27/2024 CLINICAL DATA:  Leukocytosis. EXAM: PORTABLE CHEST 1 VIEW COMPARISON:  03/13/2024 FINDINGS: Lung volumes are low. Heart is upper normal in size but stable. Aortic atherosclerosis and tortuosity, unchanged. No confluent consolidation. No pleural fluid or pneumothorax. Chronic right shoulder arthropathy. IMPRESSION: Low lung volumes without acute chest finding. Electronically Signed   By: Andrea Gasman M.D.   On: 04/27/2024 21:14     Signature  -   Lavada Stank M.D on 05/20/24 at 6:27 PM   -  To page go to [email or web address]

## 2024-04-30 NOTE — Progress Notes (Signed)
 Ok to stop linezolid per Dr. Dennise.  Sergio Batch, PharmD, BCIDP, AAHIVP, CPP Infectious Disease Pharmacist April 30, 2024 9:09 AM

## 2024-04-30 NOTE — Progress Notes (Signed)
 Kayla Hunt 73 y.o. female w/ CHF, chronic LE edema, HLD, HTN, hypothyroidism, DM, CKD4 p/w progressive weakness, hallucinations, and no urine output for a day.  She had diarrhea for a week as well.  Patient was just here 3 weeks ago for stroke and was discharged.  She has been on antibiotics recently for oral abscess.   BUN 44, creatinine 7.3, WBC 32K, INR 1.9, UA cloudy, amber, protein greater than 300, few bacteria, 11-20 RBCs, 11-20 WBC, 0-5 epi.  CT abdomen showed diffuse areas of inflammation with possible colitis, enteritis, gastritis.  No evidence of perforation.    Assessment/Plan: AKI on CKD 4: b/l creatinine is 1.8- 2.1 w/ eGFR 25-30 ml/min from May 2025.  Creat here was 7.3 on admission in the setting of recent n/vd, fatigue and possible sepsis. The following day CPK was checked and was > 20,000. Renal US  showed no obstruction. Pt does not appear grossly uremic but some of her fatigue is probably related. Recommend supportive care w/ carefully giving IVF's to maintain UOP if we can.  Unfortunately renal function not improving and minimal UOP; d/w daughter and Dr. Dennise. Patient did not want long term dialysis before; I also answered Tonya's questions. There's a very good chance if we start dialysis she won't come off dialysis because of her baseline advanced CKDIV.  I also let Bascom know that chronic dialysis as a ESRD pt is very difficult logistically and also will make the patient feel worse because of her age and comorbidities. Bascom agrees and only wants to be updated.   Hypocalcemia: complication of acute rhabdo, agree w/ replacing per pmd.  Volume: dry mouth, flat neck veins, only edema is around the ankles. Will ^ IVF to 100 cc/hr and bolus 500 cc x 1.  Sepsis: per primary team, getting IV zosyn, and appears the IV vanc was dc'd which is good. No hypotension which is good.   Subjective: Confused but pleasant, c/o abd pain since returning from a study but denies nausea and SOB.    Chemistry and CBC: Creat  Date/Time Value Ref Range Status  05/11/2023 03:48 PM 1.83 (H) 0.50 - 1.05 mg/dL Final  95/72/7977 96:66 PM 1.87 (H) 0.50 - 0.99 mg/dL Final    Comment:    For patients >95 years of age, the reference limit for Creatinine is approximately 13% higher for people identified as African-American. .    Creatinine, Ser  Date/Time Value Ref Range Status  2024/05/02 09:06 AM 7.21 (H) 0.44 - 1.00 mg/dL Final  93/70/7974 91:84 AM 6.93 (H) 0.44 - 1.00 mg/dL Final  93/71/7974 96:92 PM 6.76 (H) 0.44 - 1.00 mg/dL Final  93/71/7974 87:75 PM 7.35 (H) 0.44 - 1.00 mg/dL Final  94/78/7974 94:72 AM 2.10 (H) 0.44 - 1.00 mg/dL Final  94/83/7974 92:51 AM 1.81 (H) 0.44 - 1.00 mg/dL Final  94/84/7974 93:50 AM 1.80 (H) 0.44 - 1.00 mg/dL Final  94/85/7974 88:65 AM 1.90 (H) 0.44 - 1.00 mg/dL Final  94/85/7974 88:67 AM 1.94 (H) 0.44 - 1.00 mg/dL Final  98/92/7978 93:77 PM 1.81 (H) 0.44 - 1.00 mg/dL Final  90/85/7979 88:83 AM 1.78 (H) 0.57 - 1.00 mg/dL Final  92/79/7979 94:65 PM 1.72 (H) 0.44 - 1.00 mg/dL Final  93/75/7980 94:79 AM 2.11 (H) 0.44 - 1.00 mg/dL Final  93/76/7980 92:66 AM 1.74 (H) 0.44 - 1.00 mg/dL Final  93/77/7980 94:51 AM 1.84 (H) 0.44 - 1.00 mg/dL Final  93/78/7980 92:74 AM 1.68 (H) 0.44 - 1.00 mg/dL Final  93/79/7980 93:93 AM  1.69 (H) 0.44 - 1.00 mg/dL Final  93/80/7980 98:76 AM 2.01 (H) 0.44 - 1.00 mg/dL Final  93/81/7980 94:89 AM 1.83 (H) 0.44 - 1.00 mg/dL Final  93/82/7980 93:56 AM 2.00 (H) 0.44 - 1.00 mg/dL Final  93/83/7980 91:77 AM 2.17 (H) 0.44 - 1.00 mg/dL Final  93/84/7980 94:59 AM 2.16 (H) 0.44 - 1.00 mg/dL Final  93/85/7980 95:68 PM 1.92 (H) 0.44 - 1.00 mg/dL Final  93/85/7980 91:97 AM 1.84 (H) 0.44 - 1.00 mg/dL Final  93/86/7980 98:62 PM 1.69 (H) 0.44 - 1.00 mg/dL Final  93/87/7980 93:72 PM 2.10 (H) 0.44 - 1.00 mg/dL Final  94/92/7983 90:66 AM 2.04 (H) 0.44 - 1.00 mg/dL Final  92/93/7987 95:69 AM 1.24 (H) 0.50 - 1.10 mg/dL Final    Comment:     **Please note change in reference range.**  05/05/2011 12:51 PM 1.42 (H) 0.50 - 1.10 mg/dL Final    Comment:    **Please note change in reference range.**  07/21/2008 10:28 PM 1.68 (H) 0.40 - 1.20 mg/dL Final   Recent Labs  Lab 04/27/24 1224 04/27/24 1507 04/28/24 0815 2024-05-07 0906  NA 132* 133* 134* 132*  K 4.5 4.2 4.8 5.2*  CL 91* 93* 98 100  CO2 21* 22 19* 15*  GLUCOSE 71 58* 232* 79  BUN 44* 44* 46* 49*  CREATININE 7.35* 6.76* 6.93* 7.21*  CALCIUM  7.3* 6.8* 6.3* 6.7*  PHOS  --  4.2 5.3* 5.3*   Recent Labs  Lab 04/27/24 1224 04/27/24 1815 04/28/24 0815  WBC 32.1* 29.1* 31.3*  NEUTROABS 29.4* 26.2* 29.1*  HGB 11.0* 9.8* 10.1*  HCT 33.1* 29.5* 30.1*  MCV 84.9 85.8 84.8  PLT 171 139* 147*   Liver Function Tests: Recent Labs  Lab 04/27/24 1507 04/28/24 0815 05-07-2024 0906  AST 1,871* 1,871* 1,771*  ALT 518* 520* 490*  ALKPHOS 214* 232* 187*  BILITOT 1.6* 1.6* 1.7*  PROT 6.2* 6.0* 5.9*  ALBUMIN 1.8* 1.7* <1.5*   Recent Labs  Lab 04/27/24 1224  LIPASE 124*   No results for input(s): AMMONIA in the last 168 hours. Cardiac Enzymes: Recent Labs  Lab 04/28/24 1317 07-May-2024 0906  CKTOTAL >20,000* >20,000*   Iron Studies: No results for input(s): IRON, TIBC, TRANSFERRIN, FERRITIN in the last 72 hours. PT/INR: @LABRCNTIP (inr:5)  Xrays/Other Studies: ) Results for orders placed or performed during the hospital encounter of 04/27/24 (from the past 48 hours)  Comprehensive metabolic panel     Status: Abnormal   Collection Time: 04/27/24 12:24 PM  Result Value Ref Range   Sodium 132 (L) 135 - 145 mmol/L   Potassium 4.5 3.5 - 5.1 mmol/L   Chloride 91 (L) 98 - 111 mmol/L   CO2 21 (L) 22 - 32 mmol/L   Glucose, Bld 71 70 - 99 mg/dL    Comment: Glucose reference range applies only to samples taken after fasting for at least 8 hours.   BUN 44 (H) 8 - 23 mg/dL   Creatinine, Ser 2.64 (H) 0.44 - 1.00 mg/dL   Calcium  7.3 (L) 8.9 - 10.3 mg/dL   Total  Protein 6.5 6.5 - 8.1 g/dL   Albumin 2.0 (L) 3.5 - 5.0 g/dL   AST 7,964 (H) 15 - 41 U/L   ALT 583 (H) 0 - 44 U/L   Alkaline Phosphatase 241 (H) 38 - 126 U/L   Total Bilirubin 2.0 (H) 0.0 - 1.2 mg/dL   GFR, Estimated 6 (L) >60 mL/min    Comment: (NOTE) Calculated using the CKD-EPI Creatinine Equation (  2021)    Anion gap 20 (H) 5 - 15    Comment: Performed at [facility] Lab, [address] [place], [place] [address]  CBC with Differential     Status: Abnormal   Collection Time: 04/27/24 12:24 PM  Result Value Ref Range   WBC 32.1 (H) 4.0 - 10.5 K/uL   RBC 3.90 3.87 - 5.11 MIL/uL   Hemoglobin 11.0 (L) 12.0 - 15.0 g/dL   HCT 66.8 (L) 63.9 - 53.9 %   MCV 84.9 80.0 - 100.0 fL   MCH 28.2 26.0 - 34.0 pg   MCHC 33.2 30.0 - 36.0 g/dL   RDW 84.7 88.4 - 84.4 %   Platelets 171 150 - 400 K/uL    Comment: REPEATED TO VERIFY   nRBC 0.0 0.0 - 0.2 %   Neutrophils Relative % 92 %   Neutro Abs 29.4 (H) 1.7 - 7.7 K/uL   Lymphocytes Relative 3 %   Lymphs Abs 1.1 0.7 - 4.0 K/uL   Monocytes Relative 4 %   Monocytes Absolute 1.2 (H) 0.1 - 1.0 K/uL   Eosinophils Relative 0 %   Eosinophils Absolute 0.0 0.0 - 0.5 K/uL   Basophils Relative 0 %   Basophils Absolute 0.1 0.0 - 0.1 K/uL   Immature Granulocytes 1 %   Abs Immature Granulocytes 0.27 (H) 0.00 - 0.07 K/uL    Comment: Performed at [facility] Lab, [address], [place], [place] [address]  Lipase, blood     Status: Abnormal   Collection Time: 04/27/24 12:24 PM  Result Value Ref Range   Lipase 124 (H) 11 - 51 U/L    Comment: Performed at [facility] Lab, [address], [place], [place] [address]  Magnesium     Status: None   Collection Time: 04/27/24 12:24 PM  Result Value Ref Range   Magnesium 2.3 1.7 - 2.4 mg/dL    Comment: Performed at [facility], [address], [place], [place] [address]  I-Stat Lactic Acid, ED     Status: Abnormal   Collection Time: 04/27/24 12:37 PM  Result Value Ref Range   Lactic Acid,  Venous 2.9 (HH) 0.5 - 1.9 mmol/L   Comment NOTIFIED PHYSICIAN   Protime-INR     Status: Abnormal   Collection Time: 04/27/24  3:07 PM  Result Value Ref Range   Prothrombin Time 22.8 (H) 11.4 - 15.2 seconds   INR 1.9 (H) 0.8 - 1.2    Comment: (NOTE) INR goal varies based on device and disease states. Performed at [facility] Lab, [address] [place], [place] [address]   Hepatitis panel, acute     Status: None   Collection Time: 04/27/24  3:07 PM  Result Value Ref Range   Hepatitis B Surface Ag NON REACTIVE NON REACTIVE   HCV Ab NON REACTIVE NON REACTIVE    Comment: (NOTE) Nonreactive HCV antibody screen is consistent with no HCV infections,  unless recent infection is suspected or other evidence exists to indicate HCV infection.     Hep A IgM NON REACTIVE NON REACTIVE   Hep B C IgM NON REACTIVE NON REACTIVE    Comment: Performed at [facility], [address], [place], [place] [address]  Acetaminophen  level     Status: Abnormal   Collection Time: 04/27/24  3:07 PM  Result Value Ref Range   Acetaminophen  (Tylenol ), Serum <10 (L) 10 - 30 ug/mL    Comment: (NOTE) Therapeutic concentrations  vary significantly. A range of 10-30 ug/mL  may be an effective concentration for many patients. However, some  are best treated at concentrations outside of this range. Acetaminophen  concentrations >150 ug/mL at 4 hours after ingestion  and >50 ug/mL at 12 hours after ingestion are often associated with  toxic reactions.  Performed at [facility] Lab, [address], [place], [place] [address]   Comprehensive metabolic panel     Status: Abnormal   Collection Time: 04/27/24  3:07 PM  Result Value Ref Range   Sodium 133 (L) 135 - 145 mmol/L   Potassium 4.2 3.5 - 5.1 mmol/L   Chloride 93 (L) 98 - 111 mmol/L   CO2 22 22 - 32 mmol/L   Glucose, Bld 58 (L) 70 - 99 mg/dL    Comment: Glucose reference range applies only to samples taken after fasting for at least 8 hours.    BUN 44 (H) 8 - 23 mg/dL   Creatinine, Ser 3.23 (H) 0.44 - 1.00 mg/dL   Calcium  6.8 (L) 8.9 - 10.3 mg/dL   Total Protein 6.2 (L) 6.5 - 8.1 g/dL   Albumin 1.8 (L) 3.5 - 5.0 g/dL   AST 8,128 (H) 15 - 41 U/L   ALT 518 (H) 0 - 44 U/L   Alkaline Phosphatase 214 (H) 38 - 126 U/L   Total Bilirubin 1.6 (H) 0.0 - 1.2 mg/dL   GFR, Estimated 6 (L) >60 mL/min    Comment: (NOTE) Calculated using the CKD-EPI Creatinine Equation (2021)    Anion gap 18 (H) 5 - 15    Comment: Performed at [facility], [address], [place], [place] [address]  Magnesium     Status: None   Collection Time: 04/27/24  3:07 PM  Result Value Ref Range   Magnesium 2.1 1.7 - 2.4 mg/dL    Comment: Performed at [facility], [address], [place], [place] [address]  Phosphorus     Status: None   Collection Time: 04/27/24  3:07 PM  Result Value Ref Range   Phosphorus 4.2 2.5 - 4.6 mg/dL    Comment: Performed at [facility] Lab, [address], [place], [place] [address]  Procalcitonin     Status: None   Collection Time: 04/27/24  3:07 PM  Result Value Ref Range   Procalcitonin 7.47 ng/mL    Comment:        Interpretation: PCT > 2 ng/mL: Systemic infection (sepsis) is likely, unless other causes are known. (NOTE)       Sepsis PCT Algorithm           Lower Respiratory Tract                                      Infection PCT Algorithm    ----------------------------     ----------------------------         PCT < 0.25 ng/mL                PCT < 0.10 ng/mL          Strongly encourage             Strongly discourage   discontinuation of antibiotics    initiation of antibiotics    ----------------------------     -----------------------------       PCT 0.25 - 0.50 ng/mL  PCT 0.10 - 0.25 ng/mL               OR       >80% decrease in PCT            Discourage initiation of                                            antibiotics      Encourage discontinuation           of antibiotics     ----------------------------     -----------------------------         PCT >= 0.50 ng/mL              PCT 0.26 - 0.50 ng/mL               AND       <80% decrease in PCT              Encourage initiation of                                             antibiotics       Encourage continuation           of antibiotics    ----------------------------     -----------------------------        PCT >= 0.50 ng/mL                  PCT > 0.50 ng/mL               AND         increase in PCT                  Strongly encourage                                      initiation of antibiotics    Strongly encourage escalation           of antibiotics                                     -----------------------------                                           PCT <= 0.25 ng/mL                                                 OR                                        > 80% decrease in PCT  Discontinue / Do not initiate                                             antibiotics  Performed at [facility], [address], [place], [place] [address]   I-Stat Lactic Acid, ED     Status: None   Collection Time: 04/27/24  3:44 PM  Result Value Ref Range   Lactic Acid, Venous 1.5 0.5 - 1.9 mmol/L  C Difficile Quick Screen w PCR reflex     Status: None   Collection Time: 04/27/24  5:57 PM   Specimen: Stool  Result Value Ref Range   C Diff antigen NEGATIVE NEGATIVE   C Diff toxin NEGATIVE NEGATIVE   C Diff interpretation No C. difficile detected.     Comment: Performed at [facility] Lab, [address], [place], [place] [address]  Gastrointestinal Panel by PCR , Stool     Status: Abnormal   Collection Time: 04/27/24  5:57 PM   Specimen: Stool  Result Value Ref Range   Campylobacter species NOT DETECTED NOT DETECTED   Plesimonas shigelloides NOT DETECTED NOT DETECTED   Salmonella species NOT DETECTED NOT DETECTED   Yersinia enterocolitica NOT DETECTED NOT DETECTED    Vibrio species NOT DETECTED NOT DETECTED   Vibrio cholerae NOT DETECTED NOT DETECTED   Enteroaggregative E coli (EAEC) NOT DETECTED NOT DETECTED   Enteropathogenic E coli (EPEC) NOT DETECTED NOT DETECTED   Enterotoxigenic E coli (ETEC) NOT DETECTED NOT DETECTED   Shiga like toxin producing E coli (STEC) NOT DETECTED NOT DETECTED   Shigella/Enteroinvasive E coli (EIEC) NOT DETECTED NOT DETECTED   Cryptosporidium NOT DETECTED NOT DETECTED   Cyclospora cayetanensis NOT DETECTED NOT DETECTED   Entamoeba histolytica NOT DETECTED NOT DETECTED   Giardia lamblia NOT DETECTED NOT DETECTED   Adenovirus F40/41 NOT DETECTED NOT DETECTED   Astrovirus NOT DETECTED NOT DETECTED   Norovirus GI/GII DETECTED (A) NOT DETECTED    Comment: RESULT CALLED TO, READ BACK BY AND VERIFIED WITH: FLORENCE SNOW RN 1009 2024/05/09 HNM    Rotavirus A NOT DETECTED NOT DETECTED   Sapovirus (I, II, IV, and V) NOT DETECTED NOT DETECTED    Comment: Performed at [facility], [address] [place], [place] [address]  Resp panel by RT-PCR (RSV, Flu A&B, Covid) Anterior Nasal Swab     Status: None   Collection Time: 04/27/24  6:06 PM   Specimen: Anterior Nasal Swab  Result Value Ref Range   SARS Coronavirus 2 by RT PCR NEGATIVE NEGATIVE   Influenza A by PCR NEGATIVE NEGATIVE   Influenza B by PCR NEGATIVE NEGATIVE    Comment: (NOTE) The Xpert Xpress SARS-CoV-2/FLU/RSV plus assay is intended as an aid in the diagnosis of influenza from Nasopharyngeal swab specimens and should not be used as a sole basis for treatment. Nasal washings and aspirates are unacceptable for Xpert Xpress SARS-CoV-2/FLU/RSV testing.  Fact Sheet for Patients: BloggerCourse.com  Fact Sheet for Healthcare Providers: SeriousBroker.it  This test is not yet approved or cleared by the [place]  FDA and has been authorized for detection and/or diagnosis of SARS-CoV-2 by FDA under an  Emergency Use Authorization (EUA). This EUA will remain in effect (meaning this test can be used) for the duration of the COVID-19 declaration under Section 564(b)(1) of the Act, 21 U.S.C. section 360bbb-3(b)(1), unless the  authorization is terminated or revoked.     Resp Syncytial Virus by PCR NEGATIVE NEGATIVE    Comment: (NOTE) Fact Sheet for Patients: BloggerCourse.com  Fact Sheet for Healthcare Providers: SeriousBroker.it  This test is not yet approved or cleared by the [place]  FDA and has been authorized for detection and/or diagnosis of SARS-CoV-2 by FDA under an Emergency Use Authorization (EUA). This EUA will remain in effect (meaning this test can be used) for the duration of the COVID-19 declaration under Section 564(b)(1) of the Act, 21 U.S.C. section 360bbb-3(b)(1), unless the authorization is terminated or revoked.  Performed at [facility], [address], [place], [place] [address]   Rapid urine drug screen (hospital performed)     Status: None   Collection Time: 04/27/24  6:15 PM  Result Value Ref Range   Opiates NONE DETECTED NONE DETECTED   Cocaine NONE DETECTED NONE DETECTED   Benzodiazepines NONE DETECTED NONE DETECTED   Amphetamines NONE DETECTED NONE DETECTED   Tetrahydrocannabinol NONE DETECTED NONE DETECTED   Barbiturates NONE DETECTED NONE DETECTED    Comment: (NOTE) DRUG SCREEN FOR MEDICAL PURPOSES ONLY.  IF CONFIRMATION IS NEEDED FOR ANY PURPOSE, NOTIFY LAB WITHIN 5 DAYS.  LOWEST DETECTABLE LIMITS FOR URINE DRUG SCREEN Drug Class                     Cutoff (ng/mL) Amphetamine and metabolites    1000 Barbiturate and metabolites    200 Benzodiazepine                 200 Opiates and metabolites        300 Cocaine and metabolites        300 THC                            50 Performed at [facility] Lab, [address], [place], [place] [address]   CBC with  Differential/Platelet     Status: Abnormal   Collection Time: 04/27/24  6:15 PM  Result Value Ref Range   WBC 29.1 (H) 4.0 - 10.5 K/uL   RBC 3.44 (L) 3.87 - 5.11 MIL/uL   Hemoglobin 9.8 (L) 12.0 - 15.0 g/dL   HCT 70.4 (L) 63.9 - 53.9 %   MCV 85.8 80.0 - 100.0 fL   MCH 28.5 26.0 - 34.0 pg   MCHC 33.2 30.0 - 36.0 g/dL   RDW 84.7 88.4 - 84.4 %   Platelets 139 (L) 150 - 400 K/uL    Comment: REPEATED TO VERIFY   nRBC 0.0 0.0 - 0.2 %   Neutrophils Relative % 90 %   Neutro Abs 26.2 (H) 1.7 - 7.7 K/uL   Lymphocytes Relative 4 %   Lymphs Abs 1.2 0.7 - 4.0 K/uL   Monocytes Relative 5 %   Monocytes Absolute 1.3 (H) 0.1 - 1.0 K/uL   Eosinophils Relative 0 %   Eosinophils Absolute 0.0 0.0 - 0.5 K/uL   Basophils Relative 0 %   Basophils Absolute 0.1 0.0 - 0.1 K/uL   Immature Granulocytes 1 %   Abs Immature Granulocytes 0.31 (H) 0.00 - 0.07 K/uL    Comment: Performed at [facility], [address], [place], [place] [address]  Urinalysis, w/ Reflex to Culture (Infection Suspected) -Urine, Clean Catch     Status: Abnormal   Collection Time: 04/27/24  6:17 PM  Result Value Ref Range   Specimen Source URINE,  CLEAN CATCH    Color, Urine AMBER (A) YELLOW    Comment: BIOCHEMICALS MAY BE AFFECTED BY COLOR   APPearance CLOUDY (A) CLEAR   Specific Gravity, Urine 1.022 1.005 - 1.030   pH 5.0 5.0 - 8.0   Glucose, UA 50 (A) NEGATIVE mg/dL   Hgb urine dipstick LARGE (A) NEGATIVE   Bilirubin Urine NEGATIVE NEGATIVE   Ketones, ur NEGATIVE NEGATIVE mg/dL   Protein, ur >=699 (A) NEGATIVE mg/dL   Nitrite NEGATIVE NEGATIVE   Leukocytes,Ua NEGATIVE NEGATIVE   RBC / HPF 11-20 0 - 5 RBC/hpf   WBC, UA 11-20 0 - 5 WBC/hpf    Comment:        Reflex urine culture not performed if WBC <=10, OR if Squamous epithelial cells >5. If Squamous epithelial cells >5 suggest recollection.    Bacteria, UA FEW (A) NONE SEEN   Squamous Epithelial / HPF 0-5 0 - 5 /HPF   Amorphous Crystal PRESENT     Comment:  Performed at [facility], [address], [place], [place] [address]  Urine Culture     Status: None   Collection Time: 04/27/24  6:17 PM   Specimen: Urine, Random  Result Value Ref Range   Specimen Description URINE, RANDOM    Special Requests NONE Reflexed from [id number]    Culture      NO GROWTH Performed at [facility], [address] [place], [place] [address]    Report Status 04/28/2024 FINAL   CBG monitoring, ED     Status: Abnormal   Collection Time: 04/27/24  6:49 PM  Result Value Ref Range   Glucose-Capillary 55 (L) 70 - 99 mg/dL    Comment: Glucose reference range applies only to samples taken after fasting for at least 8 hours.  CBG monitoring, ED     Status: Abnormal   Collection Time: 04/27/24  9:08 PM  Result Value Ref Range   Glucose-Capillary 49 (L) 70 - 99 mg/dL    Comment: Glucose reference range applies only to samples taken after fasting for at least 8 hours.  Glucose, capillary     Status: Abnormal   Collection Time: 04/28/24  7:40 AM  Result Value Ref Range   Glucose-Capillary 228 (H) 70 - 99 mg/dL    Comment: Glucose reference range applies only to samples taken after fasting for at least 8 hours.  Protime-INR     Status: Abnormal   Collection Time: 04/28/24  8:15 AM  Result Value Ref Range   Prothrombin Time 22.7 (H) 11.4 - 15.2 seconds   INR 1.9 (H) 0.8 - 1.2    Comment: (NOTE) INR goal varies based on device and disease states. Performed at [facility], [address], [place], [place] [address]   TSH     Status: Abnormal   Collection Time: 04/28/24  8:15 AM  Result Value Ref Range   TSH 35.103 (H) 0.350 - 4.500 uIU/mL    Comment: Performed by a 3rd Generation assay with a functional sensitivity of <=0.01 uIU/mL. Performed at [facility], [address], [place], [place] [address]   Comprehensive metabolic panel     Status: Abnormal   Collection Time: 04/28/24  8:15 AM  Result Value Ref Range   Sodium 134 (L) 135 -  145 mmol/L   Potassium 4.8 3.5 - 5.1 mmol/L   Chloride 98 98 - 111 mmol/L   CO2 19 (L) 22 - 32 mmol/L   Glucose, Bld  232 (H) 70 - 99 mg/dL    Comment: Glucose reference range applies only to samples taken after fasting for at least 8 hours.   BUN 46 (H) 8 - 23 mg/dL   Creatinine, Ser 3.06 (H) 0.44 - 1.00 mg/dL   Calcium  6.3 (LL) 8.9 - 10.3 mg/dL    Comment: CRITICAL RESULT CALLED TO, READ BACK BY AND VERIFIED WITH F,SNOW RN @1110  04/28/24 E,[place]   Total Protein 6.0 (L) 6.5 - 8.1 g/dL   Albumin 1.7 (L) 3.5 - 5.0 g/dL   AST 8,128 (H) 15 - 41 U/L   ALT 520 (H) 0 - 44 U/L   Alkaline Phosphatase 232 (H) 38 - 126 U/L   Total Bilirubin 1.6 (H) 0.0 - 1.2 mg/dL   GFR, Estimated 6 (L) >60 mL/min    Comment: (NOTE) Calculated using the CKD-EPI Creatinine Equation (2021)    Anion gap 17 (H) 5 - 15    Comment: Performed at [facility] Lab, [address], [place], [place] [address]  Magnesium     Status: None   Collection Time: 04/28/24  8:15 AM  Result Value Ref Range   Magnesium 2.0 1.7 - 2.4 mg/dL    Comment: Performed at [facility], [address], [place], [place] [address]  Phosphorus     Status: Abnormal   Collection Time: 04/28/24  8:15 AM  Result Value Ref Range   Phosphorus 5.3 (H) 2.5 - 4.6 mg/dL    Comment: Performed at [facility], [address], [place], [place] [address]  CBC with Differential/Platelet     Status: Abnormal   Collection Time: 04/28/24  8:15 AM  Result Value Ref Range   WBC 31.3 (H) 4.0 - 10.5 K/uL   RBC 3.55 (L) 3.87 - 5.11 MIL/uL   Hemoglobin 10.1 (L) 12.0 - 15.0 g/dL   HCT 69.8 (L) 63.9 - 53.9 %   MCV 84.8 80.0 - 100.0 fL   MCH 28.5 26.0 - 34.0 pg   MCHC 33.6 30.0 - 36.0 g/dL   RDW 84.3 (H) 88.4 - 84.4 %   Platelets 147 (L) 150 - 400 K/uL    Comment: REPEATED TO VERIFY   nRBC 0.1 0.0 - 0.2 %   Neutrophils Relative % 93 %   Neutro Abs 29.1 (H) 1.7 - 7.7 K/uL   Lymphocytes Relative 3 %   Lymphs Abs 0.9 0.7 - 4.0 K/uL   Monocytes  Relative 4 %   Monocytes Absolute 1.3 (H) 0.1 - 1.0 K/uL   Eosinophils Relative 0 %   Eosinophils Absolute 0.0 0.0 - 0.5 K/uL   Basophils Relative 0 %   Basophils Absolute 0.0 0.0 - 0.1 K/uL   WBC Morphology See Note     Comment: Vaculated Neutrophils   Smear Review See Note     Comment: Normal Platelet Morphology   nRBC 1 (H) 0 /100 WBC   Abs Immature Granulocytes 0.00 0.00 - 0.07 K/uL   Acanthocytes PRESENT    Burr Cells PRESENT    Polychromasia PRESENT     Comment: Performed at [facility] Lab, [address], [place], [place] [address]  Procalcitonin     Status: None   Collection Time: 04/28/24  8:15 AM  Result Value Ref Range   Procalcitonin 7.30 ng/mL    Comment:        Interpretation: PCT > 2 ng/mL: Systemic infection (sepsis) is likely, unless other causes are known. (NOTE)  Sepsis PCT Algorithm           Lower Respiratory Tract                                      Infection PCT Algorithm    ----------------------------     ----------------------------         PCT < 0.25 ng/mL                PCT < 0.10 ng/mL          Strongly encourage             Strongly discourage   discontinuation of antibiotics    initiation of antibiotics    ----------------------------     -----------------------------       PCT 0.25 - 0.50 ng/mL            PCT 0.10 - 0.25 ng/mL               OR       >80% decrease in PCT            Discourage initiation of                                            antibiotics      Encourage discontinuation           of antibiotics    ----------------------------     -----------------------------         PCT >= 0.50 ng/mL              PCT 0.26 - 0.50 ng/mL               AND       <80% decrease in PCT              Encourage initiation of                                             antibiotics       Encourage continuation           of antibiotics    ----------------------------     -----------------------------        PCT >= 0.50 ng/mL                   PCT > 0.50 ng/mL               AND         increase in PCT                  Strongly encourage                                      initiation of antibiotics    Strongly encourage escalation           of antibiotics                                     -----------------------------  PCT <= 0.25 ng/mL                                                 OR                                        > 80% decrease in PCT                                      Discontinue / Do not initiate                                             antibiotics  Performed at [facility], [address], [place], [place] [address]   C-reactive protein     Status: Abnormal   Collection Time: 04/28/24  8:15 AM  Result Value Ref Range   CRP 13.1 (H) <1.0 mg/dL    Comment: Performed at [facility], [address] [place], [place] [address]  T4, free     Status: Abnormal   Collection Time: 04/28/24 11:00 AM  Result Value Ref Range   Free T4 0.47 (L) 0.61 - 1.12 ng/dL    Comment: (NOTE) Biotin ingestion may interfere with free T4 tests. If the results are inconsistent with the TSH level, previous test results, or the clinical presentation, then consider biotin interference. If needed, order repeat testing after stopping biotin. Performed at [facility], [address], [place], [place] [address]   Glucose, capillary     Status: Abnormal   Collection Time: 04/28/24 11:55 AM  Result Value Ref Range   Glucose-Capillary 194 (H) 70 - 99 mg/dL    Comment: Glucose reference range applies only to samples taken after fasting for at least 8 hours.  CK     Status: Abnormal   Collection Time: 04/28/24  1:17 PM  Result Value Ref Range   Total CK >20,000 (H) 38 - 234 U/L    Comment: RESULT CONFIRMED BY MANUAL DILUTION Performed at [facility], [address], [place], [place] [address]   Glucose, capillary     Status: Abnormal   Collection Time:  04/28/24  3:47 PM  Result Value Ref Range   Glucose-Capillary 142 (H) 70 - 99 mg/dL    Comment: Glucose reference range applies only to samples taken after fasting for at least 8 hours.  Glucose, capillary     Status: Abnormal   Collection Time: 04/28/24  7:22 PM  Result Value Ref Range   Glucose-Capillary 102 (H) 70 - 99 mg/dL    Comment: Glucose reference range applies only to samples taken after fasting for at least 8 hours.   Comment 1 Notify RN    Comment 2 Document in Chart   Glucose, capillary     Status: None   Collection Time: 04/28/24 10:15 PM  Result Value Ref Range   Glucose-Capillary 76 70 - 99 mg/dL    Comment: Glucose reference range applies only to samples taken after fasting for at least 8 hours.  Glucose,  capillary     Status: None   Collection Time: 05-13-24  7:55 AM  Result Value Ref Range   Glucose-Capillary 85 70 - 99 mg/dL    Comment: Glucose reference range applies only to samples taken after fasting for at least 8 hours.  Phosphorus     Status: Abnormal   Collection Time: 05-13-2024  9:06 AM  Result Value Ref Range   Phosphorus 5.3 (H) 2.5 - 4.6 mg/dL    Comment: Performed at [facility], [address], [place], [place] [address]  Procalcitonin     Status: None   Collection Time: 2024/05/13  9:06 AM  Result Value Ref Range   Procalcitonin 5.76 ng/mL    Comment:        Interpretation: PCT > 2 ng/mL: Systemic infection (sepsis) is likely, unless other causes are known. (NOTE)       Sepsis PCT Algorithm           Lower Respiratory Tract                                      Infection PCT Algorithm    ----------------------------     ----------------------------         PCT < 0.25 ng/mL                PCT < 0.10 ng/mL          Strongly encourage             Strongly discourage   discontinuation of antibiotics    initiation of antibiotics    ----------------------------     -----------------------------       PCT 0.25 - 0.50 ng/mL            PCT 0.10  - 0.25 ng/mL               OR       >80% decrease in PCT            Discourage initiation of                                            antibiotics      Encourage discontinuation           of antibiotics    ----------------------------     -----------------------------         PCT >= 0.50 ng/mL              PCT 0.26 - 0.50 ng/mL               AND       <80% decrease in PCT              Encourage initiation of                                             antibiotics       Encourage continuation           of antibiotics    ----------------------------     -----------------------------        PCT >= 0.50 ng/mL  PCT > 0.50 ng/mL               AND         increase in PCT                  Strongly encourage                                      initiation of antibiotics    Strongly encourage escalation           of antibiotics                                     -----------------------------                                           PCT <= 0.25 ng/mL                                                 OR                                        > 80% decrease in PCT                                      Discontinue / Do not initiate                                             antibiotics  Performed at [facility], [address], [place], [place] [address]   Magnesium     Status: None   Collection Time: 05/17/2024  9:06 AM  Result Value Ref Range   Magnesium 2.0 1.7 - 2.4 mg/dL    Comment: Performed at [facility], [address], [place], [place] [address]  C-reactive protein     Status: Abnormal   Collection Time: 05-17-24  9:06 AM  Result Value Ref Range   CRP 9.9 (H) <1.0 mg/dL    Comment: Performed at [facility], [address] [place], [place] [address]  Comprehensive metabolic panel with GFR     Status: Abnormal   Collection Time: 05-17-24  9:06 AM  Result Value Ref Range   Sodium 132 (L) 135 - 145 mmol/L   Potassium 5.2 (H) 3.5 - 5.1 mmol/L    Chloride 100 98 - 111 mmol/L   CO2 15 (L) 22 - 32 mmol/L   Glucose, Bld 79 70 - 99 mg/dL    Comment: Glucose reference range applies only to samples taken after fasting for at least 8 hours.   BUN 49 (H) 8 - 23 mg/dL   Creatinine, Ser 2.78 (H) 0.44 - 1.00 mg/dL   Calcium  6.7 (L) 8.9 - 10.3 mg/dL   Total Protein 5.9 (L)  6.5 - 8.1 g/dL   Albumin <8.4 (L) 3.5 - 5.0 g/dL   AST 8,228 (H) 15 - 41 U/L   ALT 490 (H) 0 - 44 U/L   Alkaline Phosphatase 187 (H) 38 - 126 U/L   Total Bilirubin 1.7 (H) 0.0 - 1.2 mg/dL   GFR, Estimated 6 (L) >60 mL/min    Comment: (NOTE) Calculated using the CKD-EPI Creatinine Equation (2021)    Anion gap 17 (H) 5 - 15    Comment: Performed at [facility], [address] [place], [place] [address]  CK     Status: Abnormal   Collection Time: 05-09-2024  9:06 AM  Result Value Ref Range   Total CK >20,000 (H) 38 - 234 U/L    Comment: RESULT CONFIRMED BY MANUAL DILUTION Performed at [facility], [address] [place], [place] [address]   Glucose, capillary     Status: Abnormal   Collection Time: 2024-05-09 11:51 AM  Result Value Ref Range   Glucose-Capillary 49 (L) 70 - 99 mg/dL    Comment: Glucose reference range applies only to samples taken after fasting for at least 8 hours.   US  ABD LTD RUG W/LIVER DOPPLER Result Date: 05-09-24 CLINICAL DATA:  Elevated liver function test. EXAM: DUPLEX ULTRASOUND OF LIVER TECHNIQUE: Color and duplex Doppler ultrasound was performed to evaluate the hepatic in-flow and out-flow vessels. COMPARISON:  Ultrasound 04/27/2024 FINDINGS: Liver: Normal parenchymal echogenicity. Normal hepatic contour without nodularity. No focal lesion, mass or intrahepatic biliary ductal dilatation. Gallbladder is distended with some sludge. Slight wall thickening and adjacent fluid/edema. Common duct measures 4 mm. Main Portal Vein size: 0.6 cm Portal Vein Velocities Main Prox:  43.1 cm/sec Main Mid: 50.4 cm/sec Main Dist:  28.0 cm/sec  Right: 13.7 cm/sec Left: 23.0 cm/sec Hepatic Vein Velocities Right:  17.9 cm/sec Middle:  37.3 cm/sec Left:  53.6 cm/sec IVC: Present and patent with normal respiratory phasicity. Hepatic Artery Velocity:  189.9 cm/sec Splenic Vein Velocity:  Poorly seen Spleen: Not well seen at this time. Spleen was normal in size on the CT scan of 04/27/2024. Portal Vein Occlusion/Thrombus: No Splenic Vein Occlusion/Thrombus: Not seen Ascites: Trace Varices: No IMPRESSION: Grossly preserved visualized hepatic vasculature. No evidence of portal vein thrombosis by ultrasound. Mild ascites. Gallbladder sludge with mild gallbladder wall thickening, nonspecific. Electronically Signed   By: Ranell Bring M.D.   On: 2024-05-09 11:44   DG Chest Port 1 View Result Date: 2024/05/09 CLINICAL DATA:  Shortness of breath. EXAM: PORTABLE CHEST 1 VIEW COMPARISON:  04/28/2024 FINDINGS: The lungs are clear without focal pneumonia, edema, pneumothorax or pleural effusion. The cardiopericardial silhouette is within normal limits for size. No acute bony abnormality. Telemetry leads overlie the chest. IMPRESSION: No active disease. Electronically Signed   By: Camellia Candle M.D.   On: 05-09-24 08:29   DG Chest Port 1 View Result Date: 04/28/2024 CLINICAL DATA:  Shortness of breath EXAM: PORTABLE CHEST 1 VIEW COMPARISON:  04/27/2024 FINDINGS: Heart size is normal. Aortic atherosclerotic calcifications. Unchanged asymmetric elevation of the left hemidiaphragm. Mild increased interstitial prominence. No pleural effusion or airspace consolidation. Visualized osseous structures appear intact. IMPRESSION: Mild increased interstitial prominence which may reflect mild interstitial edema. Electronically Signed   By: Waddell Calk M.D.   On: 04/28/2024 07:49   US  Abdomen Limited RUQ (LIVER/GB) Result Date: 04/27/2024 CLINICAL DATA:  Elevated liver function tests. EXAM: ULTRASOUND ABDOMEN LIMITED RIGHT UPPER QUADRANT COMPARISON:  None Available.  FINDINGS: Gallbladder: A mild-to-moderate amount  of echogenic sludge is seen throughout the gallbladder lumen. No gallstones are visualized. The gallbladder wall measures 6.9 mm in thickness. Pericholecystic fluid is seen. No sonographic Murphy sign noted by sonographer. Common bile duct: Diameter: 4.6 mm Liver: No focal lesion identified. Within normal limits in parenchymal echogenicity. Portal vein is patent on color Doppler imaging with normal direction of blood flow towards the liver. Other: None. IMPRESSION: Gallbladder sludge with gallbladder wall thickening and pericholecystic fluid, without additional findings to suggest the presence of acute cholecystitis. Electronically Signed   By: Suzen Dials M.D.   On: 04/27/2024 21:46   US  RENAL Result Date: 04/27/2024 CLINICAL DATA:  AKI EXAM: RENAL / URINARY TRACT ULTRASOUND COMPLETE COMPARISON:  CT abdomen pelvis 04/27/2024 FINDINGS: Right Kidney: Renal measurements: 8.5 x 4.6 x 5.3 cm = volume: 109 mL. Increased echogenicity. 1.6 cm cyst does not require follow-up. No hydronephrosis. Left Kidney: Renal measurements: 8.4 x 4.3 x 4.7 cm = volume: 88 mL. Increased echogenicity. 1.1 cm cyst does not require follow-up. No hydronephrosis. Bladder: Foley catheter in a nondistended bladder. Other: Exam is compromised by bowel gas. IMPRESSION: 1. Increased echogenicity of the kidneys as can be seen in medical renal disease. No hydronephrosis. Electronically Signed   By: Norman Gatlin M.D.   On: 04/27/2024 21:32   DG CHEST PORT 1 VIEW Result Date: 04/27/2024 CLINICAL DATA:  Leukocytosis. EXAM: PORTABLE CHEST 1 VIEW COMPARISON:  03/13/2024 FINDINGS: Lung volumes are low. Heart is upper normal in size but stable. Aortic atherosclerosis and tortuosity, unchanged. No confluent consolidation. No pleural fluid or pneumothorax. Chronic right shoulder arthropathy. IMPRESSION: Low lung volumes without acute chest finding. Electronically Signed   By: Andrea Gasman  M.D.   On: 04/27/2024 21:14   CT ABDOMEN PELVIS WO CONTRAST Result Date: 04/27/2024 CLINICAL DATA:  Acute, non localized abdominal pain. Diarrhea for the past week. No urine output since yesterday. EXAM: CT ABDOMEN AND PELVIS WITHOUT CONTRAST TECHNIQUE: Multidetector CT imaging of the abdomen and pelvis was performed following the standard protocol without IV contrast. RADIATION DOSE REDUCTION: This exam was performed according to the departmental dose-optimization program which includes automated exposure control, adjustment of the mA and/or kV according to patient size and/or use of iterative reconstruction technique. COMPARISON:  None Available. FINDINGS: Lower chest: Normal sized heart. Atheromatous calcifications, including the coronary arteries and aorta. Minimal pericardial effusion. Elevated left hemidiaphragm. Hepatobiliary: Distended gallbladder containing sludge. Unremarkable liver. Pancreas: Moderate pancreatic atrophy. Spleen: Normal in size without focal abnormality. Adrenals/Urinary Tract: Poorly distended urinary bladder with mild-to-moderate diffuse wall thickening. Small urachal remnant. Bilateral simple appearing renal cysts, not needing imaging follow-up. Unremarkable adrenal glands and ureters. Stomach/Bowel: Poorly distended stomach with diffuse low density wall thickening. Diffuse low-density wall thickening involving the colon with rectal sparing. Minimal pericolonic soft tissue stranding. The appendix is not visualized. No secondary signs of appendicitis. Unremarkable small bowel. Vascular/Lymphatic: Atheromatous arterial calcifications without aneurysm. No enlarged lymph nodes. Reproductive: Uterus and bilateral adnexa are unremarkable. Other: No abdominal wall hernia or abnormality. No abdominopelvic ascites. Musculoskeletal: Mild lumbar and lower thoracic spine degenerative changes. Small pelvic bone islands. IMPRESSION: 1. Diffuse low-density wall thickening involving the colon with  rectal sparing, consistent with colitis. This could be due to an infectious or inflammatory colitis. 2. Poorly distended stomach with diffuse low density wall thickening, consistent with gastritis. 3. Mild-to-moderate diffuse urinary bladder wall thickening, consistent with cystitis. 4. Distended gallbladder containing sludge. 5.  Calcific coronary artery and aortic atherosclerosis. Aortic Atherosclerosis (ICD10-I70.0). Electronically Signed  By: Elspeth Bathe M.D.   On: 04/27/2024 15:45    PMH:   Past Medical History:  Diagnosis Date   Anemia    Anxiety    Arthritis    legs (04/12/2018)   CHF (congestive heart failure) (HCC)    Chronic back pain    Chronic edema    bilat LE's   CKD (chronic kidney disease), stage IV (HCC)    thelbert 04/12/2018   Depression    Gout    Hyperlipidemia    Hypertension    Hypothyroidism    Seizures (HCC) 05/05/2011   thelbert 05/05/2011   Type II diabetes mellitus (HCC)     PSH:   Past Surgical History:  Procedure Laterality Date   NO PAST SURGERIES     TRANSESOPHAGEAL ECHOCARDIOGRAM (CATH LAB) N/A 03/18/2024   Procedure: TRANSESOPHAGEAL ECHOCARDIOGRAM;  Surgeon: Francyne Headland, MD;  Location: [facility];  Service: Cardiovascular;  Laterality: N/A;    Allergies: No Known Allergies  Medications:   Prior to Admission medications   Medication Sig Start Date End Date Taking? Authorizing Provider  amoxicillin (AMOXIL) 500 MG capsule Take 500 mg by mouth every 8 (eight) hours. 04/18/24  Yes [provider]  aspirin  81 MG chewable tablet Chew 81 mg by mouth in the morning.   Yes [provider]  atorvastatin  (LIPITOR ) 80 MG tablet Take 1 tablet (80 mg total) by mouth daily. 03/21/24  Yes Akula, Vijaya, MD  cetirizine (ZYRTEC) 10 MG chewable tablet Chew 10 mg by mouth daily as needed for allergies.   Yes [provider]  levothyroxine  (SYNTHROID ) 100 MCG tablet Take 100 mcg by mouth daily before breakfast.   Yes [provider]  allopurinol  (ZYLOPRIM ) 100 MG tablet Take 100 mg by mouth daily. Patient not taking: Reported on 04/28/2024    [provider]  feeding supplement (ENSURE ENLIVE / ENSURE PLUS) LIQD Take 237 mLs by mouth 2 (two) times daily between meals. Patient not taking: Reported on 04/28/2024 03/20/24   Akula, Vijaya, MD  hydrALAZINE  (APRESOLINE ) 50 MG tablet Take 1 tablet (50 mg total) by mouth 3 (three) times daily. Patient not taking: Reported on 04/28/2024 03/20/24   Akula, Vijaya, MD  losartan -hydrochlorothiazide  (HYZAAR) 50-12.5 MG tablet Take 1 tablet by mouth in the morning. Patient not taking: Reported on 04/28/2024 03/23/21   [provider]  metoprolol  tartrate (LOPRESSOR ) 50 MG tablet TAKE 1 TABLET(50 MG) BY MOUTH TWICE DAILY Patient not taking: Reported on 04/28/2024 09/26/20   [name], NP    Discontinued Meds:   Medications Discontinued During This Encounter  Medication Reason   0.9 %  sodium chloride  infusion    dextrose  5 % and 0.9 % NaCl with KCl 20 mEq/L infusion    levothyroxine  (SYNTHROID ) tablet 100 mcg    oxyCODONE (Oxy IR/ROXICODONE) immediate release tablet 5 mg    sodium chloride  0.9 % bolus 1,000 mL    sodium chloride  flush (NS) 0.9 % injection 3 mL    vancomycin  variable dose per unstable renal function (pharmacist dosing)    TRUEplus Lancets 26G MISC    glucose blood test strip    Blood Pressure Monitoring KIT    Blood Glucose Monitoring Suppl (TRUE METRIX AIR GLUCOSE METER) DEVI    allopurinol  (ZYLOPRIM ) 100 MG tablet Patient Preference   bismuth subsalicylate (PEPTO BISMOL) 262 MG/15ML suspension Patient Preference   dextrose  5 % and 0.9 % NaCl with KCl 20 mEq/L infusion    linezolid (ZYVOX) tablet  600 mg     Social History:  reports that she has never smoked. Her smokeless tobacco use includes snuff. She reports that she does not drink alcohol and does not use drugs.  Family History:   Family History  Family history  unknown: Yes    Blood pressure (!) 104/44, pulse 88, temperature 97.6 F (36.4 C), temperature source Oral, resp. rate 20, height 5' 1 (1.549 m), weight 66.5 kg, SpO2 99%. Physical Exam: Gen alert but confused, NAD Sclera anicteric No jvd or bruits Chest clear bilat, poor effort  RRR no RG Abd soft ntnd no mass or ascites +bs GU foley MS no joint effusions or deformity Ext 1+ bilateral ankle edema, no other edema Neuro is alert, Ox 3 , nf, no asterixis     Kamauri Kathol, LYNWOOD ORN, MD May 08, 2024, 12:09 PM

## 2024-04-30 DEATH — deceased
